# Patient Record
Sex: Female | Born: 1950 | Race: Black or African American | Hispanic: No | Smoking: Never smoker
Health system: Southern US, Community
[De-identification: ages and names within clinical notes are randomized; demographics above are authoritative.]

## PROBLEM LIST (undated history)

## (undated) DIAGNOSIS — E1110 Type 2 diabetes mellitus with ketoacidosis without coma: Secondary | ICD-10-CM

## (undated) DIAGNOSIS — E119 Type 2 diabetes mellitus without complications: Secondary | ICD-10-CM

## (undated) DIAGNOSIS — I739 Peripheral vascular disease, unspecified: Secondary | ICD-10-CM

## (undated) DIAGNOSIS — G629 Polyneuropathy, unspecified: Secondary | ICD-10-CM

## (undated) DIAGNOSIS — E1101 Type 2 diabetes mellitus with hyperosmolarity with coma: Secondary | ICD-10-CM

## (undated) DIAGNOSIS — I7101 Dissection of thoracic aorta: Secondary | ICD-10-CM

## (undated) DIAGNOSIS — E049 Nontoxic goiter, unspecified: Secondary | ICD-10-CM

## (undated) DIAGNOSIS — D62 Acute posthemorrhagic anemia: Secondary | ICD-10-CM

## (undated) DIAGNOSIS — J90 Pleural effusion, not elsewhere classified: Secondary | ICD-10-CM

## (undated) DIAGNOSIS — I10 Essential (primary) hypertension: Secondary | ICD-10-CM

## (undated) DIAGNOSIS — R410 Disorientation, unspecified: Secondary | ICD-10-CM

## (undated) DIAGNOSIS — R531 Weakness: Secondary | ICD-10-CM

## (undated) DIAGNOSIS — E785 Hyperlipidemia, unspecified: Secondary | ICD-10-CM

## (undated) DIAGNOSIS — Z95828 Presence of other vascular implants and grafts: Secondary | ICD-10-CM

## (undated) DIAGNOSIS — G8918 Other acute postprocedural pain: Secondary | ICD-10-CM

## (undated) DIAGNOSIS — R339 Retention of urine, unspecified: Secondary | ICD-10-CM

## (undated) DIAGNOSIS — G4700 Insomnia, unspecified: Secondary | ICD-10-CM

## (undated) DIAGNOSIS — I712 Thoracic aortic aneurysm, without rupture: Secondary | ICD-10-CM

## (undated) DIAGNOSIS — I517 Cardiomegaly: Secondary | ICD-10-CM

## (undated) DIAGNOSIS — N179 Acute kidney failure, unspecified: Secondary | ICD-10-CM

## (undated) DIAGNOSIS — J45909 Unspecified asthma, uncomplicated: Secondary | ICD-10-CM

## (undated) DIAGNOSIS — Z789 Other specified health status: Secondary | ICD-10-CM

## (undated) DIAGNOSIS — N3281 Overactive bladder: Secondary | ICD-10-CM

## (undated) DIAGNOSIS — E118 Type 2 diabetes mellitus with unspecified complications: Secondary | ICD-10-CM

## (undated) DIAGNOSIS — Z1211 Encounter for screening for malignant neoplasm of colon: Secondary | ICD-10-CM

## (undated) DIAGNOSIS — M545 Low back pain, unspecified: Secondary | ICD-10-CM

## (undated) DIAGNOSIS — N289 Disorder of kidney and ureter, unspecified: Secondary | ICD-10-CM

## (undated) DIAGNOSIS — M859 Disorder of bone density and structure, unspecified: Secondary | ICD-10-CM

## (undated) HISTORY — DX: Type 2 diabetes mellitus with hyperosmolarity with coma: E11.01

## (undated) HISTORY — PX: CARDIAC SURGERY: SHX584

## (undated) HISTORY — DX: Acute kidney failure, unspecified: N17.9

## (undated) HISTORY — PX: ABDOMINAL AORTIC ANEURYSM REPAIR: SUR1152

## (undated) HISTORY — DX: Disorientation, unspecified: R41.0

## (undated) HISTORY — PX: OTHER SURGICAL HISTORY: SHX169

## (undated) HISTORY — PX: SHOULDER ARTHROSCOPY: SHX128

## (undated) MED ORDER — METFORMIN 500 MG TAB
500 mg | ORAL_TABLET | Freq: Two times a day (BID) | ORAL | Status: DC
Start: ? — End: 2013-01-25

## (undated) MED ORDER — BUDESONIDE-FORMOTEROL HFA 80 MCG-4.5 MCG/ACTUATION AEROSOL INHALER
Freq: Two times a day (BID) | RESPIRATORY_TRACT | Status: DC
Start: ? — End: 2012-08-10

## (undated) MED ORDER — INDAPAMIDE 1.25 MG TAB
1.25 mg | ORAL_TABLET | Freq: Every day | ORAL | Status: DC
Start: ? — End: 2013-01-25

## (undated) MED ORDER — BUDESONIDE-FORMOTEROL HFA 80 MCG-4.5 MCG/ACTUATION AEROSOL INHALER
Freq: Two times a day (BID) | RESPIRATORY_TRACT | Status: DC
Start: ? — End: 2012-12-18

## (undated) MED ORDER — LISINOPRIL 40 MG TAB
40 mg | ORAL_TABLET | Freq: Every day | ORAL | Status: DC
Start: ? — End: 2013-11-26

## (undated) MED ORDER — AMLODIPINE 10 MG TAB
10 mg | ORAL_TABLET | Freq: Every day | ORAL | Status: DC
Start: ? — End: 2013-01-25

## (undated) MED ORDER — EPINEPHRINE 0.3 MG/0.3 ML IM PEN INJECTOR
0.3 mg/ mL | Freq: Once | INTRAMUSCULAR | Status: DC | PRN
Start: ? — End: 2013-01-25

## (undated) MED ORDER — LISINOPRIL 40 MG TAB
40 mg | ORAL_TABLET | Freq: Every day | ORAL | Status: DC
Start: ? — End: 2013-01-25

## (undated) MED ORDER — AMLODIPINE 10 MG TAB
10 mg | ORAL_TABLET | Freq: Every day | ORAL | Status: DC
Start: ? — End: 2013-11-26

## (undated) MED ORDER — INDAPAMIDE 1.25 MG TAB
1.25 mg | ORAL_TABLET | Freq: Every day | ORAL | Status: DC
Start: ? — End: 2013-11-26

## (undated) MED ORDER — BUDESONIDE-FORMOTEROL HFA 80 MCG-4.5 MCG/ACTUATION AEROSOL INHALER
Freq: Two times a day (BID) | RESPIRATORY_TRACT | Status: DC
Start: ? — End: 2014-04-19

## (undated) MED ORDER — METFORMIN 1,000 MG TAB
1000 mg | ORAL_TABLET | Freq: Two times a day (BID) | ORAL | Status: DC
Start: ? — End: 2013-11-23

---
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
## 2011-04-22 ENCOUNTER — Encounter

## 2012-07-30 NOTE — Progress Notes (Signed)
Chief Complaint   Patient presents with   ??? Establish Care   ??? Lip Swelling     History of Present Illness:      62 year old female presents to establish care.  She is transferring care due to a change in her insurance.  She notes an episode of lip swelling 3 days ago, this was after exposure to shelfish.  She has a known shelfish allergy, to which her lips swell.  She took benedryl with improvement in her symptoms.  She also takes lisinopril for hypertension.     She has a history of asthma, has never smoked but has siblings with asthma and her father smoked in the house when she was growing up.  She has diabetes, is followed by Dr. Manus Gunning ([address], [address]  [place], [place] [address]), she used to take several medications but is currently on metformin 500 twice daily.      Past Medical History   Diagnosis Date   ??? Asthma    ??? Diabetes      Surgical history: shoulder surgery    Current Outpatient Prescriptions   Medication Sig Dispense Refill   ??? EPINEPHrine (EPIPEN) 0.3 mg/0.3 mL (1:1,000) injection 0.3 mL by IntraMUSCular route once as needed for 1 dose.  1 Each  1   ??? budesonide-formoterol (SYMBICORT) 80-4.5 mcg/actuation HFAA inhaler Take 2 Puffs by inhalation two (2) times a day.  1 Inhaler  1   ??? metFORMIN (GLUCOPHAGE) 500 mg tablet Take 1 Tab by mouth two (2) times daily (with meals).  60 Tab  0   ??? lisinopril (PRINIVIL, ZESTRIL) 40 mg tablet Take 1 Tab by mouth daily.  90 Tab  1   ??? amLODIPine (NORVASC) 10 mg tablet Take 1 Tab by mouth daily.  90 Tab  0     Allergies   Allergen Reactions   ??? Iodine Swelling   ??? Penicillins Hives   ??? Salicylates Hives   ??? Shellfish Derived Swelling     History   Substance Use Topics   ??? Smoking status: Not on file   ??? Smokeless tobacco: Not on file   ??? Alcohol Use: Not on file     A 10 system ROS was negative including hematologic, cardiac, pulmonary, skin, gastrointestinal, genitourinary, HEENT, musculoskeletal, rheumatologic, and psychiatric except as noted in the  HPI.    PE:    BP 150/86   Pulse 92   Temp(Src) 99.9 ??F (37.7 ??C)   Resp 16    Alert and oriented  pleasant  Non labored respirations, lungs cta bi  Heart is regular, without murmur.  Abdomen soft  No edema in legs   Normal gait    Assessment/Plan:     62 year old female presents to establish care.     Asthma  - budesonide-formoterol (SYMBICORT) 80-4.5 mcg/actuation HFAA inhaler; Take 2 Puffs by inhalation two (2) times a day.    Diabetes: unclear control.  Labs pending.    - metFORMIN (GLUCOPHAGE) 500 mg tablet; Take 1 Tab by mouth two (2) times daily (with meals).    Hypertension: poor control, will have her check home readings and return for re-check.  Is not on a diuretic.  I asked her to bring 4-5 home blood pressure readings to the next visit.   - lisinopril (PRINIVIL, ZESTRIL) 40 mg tablet; Take 1 Tab by mouth daily.  - amLODIPine (NORVASC) 10 mg tablet; Take 1 Tab by mouth daily.    Allergic  reaction  - EPINEPHrine (EPIPEN) 0.3 mg/0.3 mL (1:1,000) injection; 0.3 mL by IntraMUSCular route once as needed for 1 dose.    We have requested labs from her endocrinologist, Dr. Manus Gunning.      She will follow up with me in 4 weeks.

## 2012-07-30 NOTE — Patient Instructions (Signed)
Asthma in Adults: After Your Visit  Your Care Instructions  During an asthma attack, your airways swell and narrow as a reaction to certain things (triggers). This makes it hard to breathe.  You may be able to prevent asthma attacks if you avoid the things that set off your asthma symptoms. Keeping your asthma under control and treating symptoms before they get bad can help you avoid severe attacks.  If you can control your asthma, you may be able to do all of your normal daily activities. You may also avoid asthma attacks and trips to the hospital.  Follow-up care is a key part of your treatment and safety. Be sure to make and go to all appointments, and call your doctor if you are having problems. It's also a good idea to know your test results and keep a list of the medicines you take.  How can you care for yourself at home?  ?? Follow your asthma action plan so you can manage your symptoms at home. An asthma action plan will help you prevent and control airway reactions and will tell you what to do during an asthma attack. If you do not have an asthma action plan, work with your doctor to build one.  ?? Take your asthma medicine exactly as prescribed. Medicine plays an important role in controlling asthma. Talk to your doctor right away if you have any questions about what to take and how to take it.  ?? Use your quick-relief medicine when you have symptoms of an attack. Quick-relief medicine often is an albuterol inhaler. Some people need to use quick-relief medicine before they exercise.  ?? Take your controller medicine every day, not just when you have symptoms. Controller medicine is usually an inhaled corticosteroid. The goal is to prevent problems before they occur. Do not use your controller medicine to try to treat an attack that has already started. It does not work fast enough to help.  ?? If your doctor prescribed corticosteroid pills to use during an attack, take them as directed. They may take hours  to work, but they may shorten the attack and help you breathe better.  ?? Keep your quick-relief medicine with you at all times.  ?? Talk to your doctor before using other medicines. Some medicines, such as aspirin, can cause asthma attacks in some people.  ?? Learn how to use a peak flow meter to check how well you are breathing. This can help you predict when an asthma attack is going to occur. Then you can take medicine to prevent the asthma attack or make it less severe.  ?? See your doctor regularly. These visits will help you learn more about asthma and what you can do to control it. Your doctor will monitor your treatment to make sure the medicine is helping you.  ?? Keep track of your asthma attacks and your treatment. After you have had an attack, write down what triggered it, what helped end it, and any concerns you have about your asthma action plan. Take your diary when you see your doctor. You can then review your asthma action plan and decide if it is working.  ?? Take your peak flow meter with you when you visit your doctor. Together, you can review the way you use it.  ?? Do not smoke or allow others to smoke around you. Avoid smoky places. Smoking makes asthma worse. If you need help quitting, talk to your doctor about stop-smoking programs and medicines. These can   increase your chances of quitting for good.  ?? Learn what triggers an asthma attack for you, and avoid the triggers when you can. Common triggers include colds, smoke, air pollution, dust, pollen, mold, pets, cockroaches, stress, and cold air.  ?? Avoid colds and the flu. Get a pneumococcal vaccine shot. If you have had one before, ask your doctor whether you need a second dose. Get a flu vaccine every fall. If you must be around people with colds or the flu, wash your hands often.  When should you call for help?  Call 911 anytime you think you may need emergency care. For example, call if:  ?? You have severe trouble breathing.  Call your doctor  now or seek immediate medical care if:  ?? Your symptoms do not get better after you have followed your asthma action plan.  ?? You cough up yellow, dark brown, or bloody mucus (sputum).  Watch closely for changes in your health, and be sure to contact your doctor if:  ?? Your coughing and wheezing get worse.  ?? You need to use quick-relief medicine on more than 2 days a week (unless it is just for exercise).  ?? You need help figuring out what is triggering your asthma attacks.   Where can you learn more?   Go to http://www.healthwise.net/[facility]  Enter P597 in the search box to learn more about "Asthma in Adults: After Your Visit."   ?? 2006-2014 Healthwise, Incorporated. Care instructions adapted under license by [employer] (which disclaims liability or warranty for this information). This care instruction is for use with your licensed healthcare professional. If you have questions about a medical condition or this instruction, always ask your healthcare professional. Healthwise, Incorporated disclaims any warranty or liability for your use of this information.  Content Version: 10.0.270728; Last Revised: February 10, 2012

## 2012-07-31 DIAGNOSIS — E1169 Type 2 diabetes mellitus with other specified complication: Secondary | ICD-10-CM | POA: Insufficient documentation

## 2012-07-31 DIAGNOSIS — E049 Nontoxic goiter, unspecified: Secondary | ICD-10-CM

## 2012-07-31 DIAGNOSIS — E785 Hyperlipidemia, unspecified: Secondary | ICD-10-CM

## 2012-07-31 HISTORY — DX: Hyperlipidemia, unspecified: E78.5

## 2012-07-31 HISTORY — DX: Nontoxic goiter, unspecified: E04.9

## 2012-07-31 NOTE — Progress Notes (Signed)
Received labs from [facility]. Dated 04/13/12    Cr 0.68  Electrolytes normal  Hepatic normal  TSH 1.88  a1c 6.8%  hgb 14.6

## 2012-08-10 ENCOUNTER — Encounter

## 2012-08-10 NOTE — Telephone Encounter (Signed)
Disregard this

## 2012-08-10 NOTE — Telephone Encounter (Signed)
Pt says that symbicort inhaler is to costly could you send something else to pharm.

## 2012-08-27 NOTE — Telephone Encounter (Signed)
Pt would like to pick up letter today please

## 2012-08-27 NOTE — Telephone Encounter (Signed)
Pt needs letter to get her water turned back on.she states she had had pneumonia twice and is diabetic  And heat.   Pt#[phone]

## 2012-08-31 NOTE — Telephone Encounter (Signed)
Nurse completed

## 2012-10-13 NOTE — Telephone Encounter (Signed)
Pt contacted.

## 2012-12-18 ENCOUNTER — Encounter

## 2013-01-25 NOTE — Patient Instructions (Addendum)
Diabetes:    - Increase metformin to 1000 mg twice daily    - Watch carbohydrate intake and aim to keep this < 45 grams per meal  - Avoid calories from beverages (including fruit juice)    Aim for 30 + minutes of exercise daily    Check sugars periodically 2 hours after meals like you are doing  Goals are 90-150 (definitely < 180)    Blood pressure:  Continue amlodipine and lisinopril  Will start indapamide 1.25 mg daily for additional blood pressure lowering    Cholesterol  - statin medication is indicated for you  - will obtain baseline labs today.
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
## 2013-01-27 LAB — METABOLIC PANEL, COMPREHENSIVE
A-G Ratio: 1.8 (ref 1.1–2.5)
ALT (SGPT): 17 IU/L (ref 0–32)
AST (SGOT): 19 IU/L (ref 0–40)
Albumin: 4.6 g/dL (ref 3.6–4.8)
Alk. phosphatase: 64 IU/L (ref 39–117)
BUN/Creatinine ratio: 12 (ref 11–26)
BUN: 9 mg/dL (ref 8–27)
Bilirubin, total: 0.4 mg/dL (ref 0.0–1.2)
CO2: 27 mmol/L (ref 18–29)
Calcium: 9.9 mg/dL (ref 8.6–10.2)
Chloride: 97 mmol/L (ref 97–108)
Creatinine: 0.73 mg/dL (ref 0.57–1.00)
GFR est AA: 101 mL/min/{1.73_m2} (ref >59)
GFR est non-AA: 88 mL/min/{1.73_m2} (ref >59)
GLOBULIN, TOTAL: 2.5 g/dL (ref 1.5–4.5)
Glucose: 100 mg/dL — ABNORMAL HIGH (ref 65–99)
Potassium: 3.8 mmol/L (ref 3.5–5.2)
Protein, total: 7.1 g/dL (ref 6.0–8.5)
Sodium: 140 mmol/L (ref 134–144)

## 2013-01-27 LAB — MICROALBUMIN:CREATININE RATIO, RANDOM URINE
Creatinine, urine random: 100.9 mg/dL (ref 15.0–278.0)
Microalb/Creat ratio (ug/mg creat.): 18.9 mg/g creat (ref 0.0–30.0)
Microalbumin, urine: 19.1 ug/mL — ABNORMAL HIGH (ref 0.0–17.0)

## 2013-01-27 LAB — LIPID PANEL
Cholesterol, total: 166 mg/dL (ref 100–199)
HDL Cholesterol: 51 mg/dL (ref >39)
LDL, calculated: 93 mg/dL (ref 0–99)
Triglyceride: 110 mg/dL (ref 0–149)
VLDL, calculated: 22 mg/dL (ref 5–40)

## 2013-01-27 LAB — TSH 3RD GENERATION: TSH: 1.54 u[IU]/mL (ref 0.450–4.500)

## 2013-01-27 NOTE — Patient Instructions (Addendum)
Colon Cancer Screening: After Your Visit  Your Care Instructions  Colorectal cancer occurs in the colon or rectum. That's the lower part of your digestive system. It is the second-leading cause of cancer deaths in the [place]. It often starts with small growths called polyps in the colon or rectum. Polyps are usually found with screening tests. Depending on the type of test, any polyps found may be removed during the tests.  Colorectal cancer usually does not cause symptoms at first. But regular tests can help find it early, before it spreads and becomes harder to treat. Experts advise routine tests for colon cancer for people starting at age 50. And they advise people with a higher risk of colon cancer to get tested sooner. Talk with your doctor about when you should start testing. Discuss which tests you need.  Follow-up care is a key part of your treatment and safety. Be sure to make and go to all appointments, and call your doctor if you are having problems. It's also a good idea to know your test results and keep a list of the medicines you take.  What are the screening tests for colon cancer?  ?? Stool tests. These include the fecal occult blood test (FOBT), the fecal immunochemical test (FIT), and the stool DNA test (sDNA). These tests check stool samples for signs of cancer. If your test is positive, you may need to have a colonoscopy.  ?? Sigmoidoscopy. This test lets your doctor look at the lining of your rectum and the lowest part of your colon. Your doctor uses a lighted tube called a sigmoidoscope. The test can also be used to remove polyps or get a tissue sample (biopsy). This test can't find cancers or polyps in the upper part of your colon.  ?? Colonoscopy. This test lets your doctor look at the lining of your rectum and your entire colon. The doctor uses a thin, flexible tool called a colonoscope. It can also be used to remove polyps or get a tissue sample (biopsy).  ?? Computed tomographic  colonography (CTC). This test is also called a virtual colonoscopy. A computer and X-rays make a detailed picture of the colon to look for polyps. If this test finds polyps, you may need to have a colonoscopy.  What tests do you need?  The following guidelines are for people age 50 and over who are not at high risk for colorectal cancer. You should have at least one of these tests as directed by your doctor.  ?? Fecal occult blood test (FOBT) each year  ?? Sigmoidoscopy every 5 years  ?? Colonoscopy every 10 years  If you are age 75 and have had regular screenings or are age 80 or older you may not need screening.  Talk with your doctor about when you need to be tested. And discuss which tests are right for you.  Your doctor may recommend earlier or more frequent testing if you:  ?? Have had colorectal cancer before.  ?? Have had colon polyps.  ?? Have symptoms of colorectal cancer. These include blood in your stool and changes in your bowel habits.  ?? Have a parent, brother or sister, or child with colon polyps or colorectal cancer.  ?? Have a bowel disease. This includes ulcerative colitis and Crohn's disease.  ?? Have a rare polyp syndrome that runs in families, such as familial adenomatous polyposis (FAP).  ?? Have had radiation treatments to the belly or pelvis.  When should you call for   help?  Call your doctor now or seek immediate medical care if:  ?? Your stools are black and tarlike or have streaks of blood.  ?? You have pain or tenderness in your lower belly.  Watch closely for changes in your health, and be sure to contact your doctor if:  ?? You have diarrhea, constipation, or another change in bowel habits that does not get better.  ?? Your stools are narrower than usual.  ?? You lose weight and you don't know why.  ?? You have any questions about your screening tests or schedule.   Where can you learn more?   Go to [email or web address]  Enter M541 in the search box to learn more about "Colon Cancer  Screening: After Your Visit."   ?? 2006-2014 Healthwise, Incorporated. Care instructions adapted under license by [employer] (which disclaims liability or warranty for this information). This care instruction is for use with your licensed healthcare professional. If you have questions about a medical condition or this instruction, always ask your healthcare professional. Healthwise, Incorporated disclaims any warranty or liability for your use of this information.  Content Version: 10.1.311062; Current as of: January 08, 2012              Statins: After Your Visit  Your Care Instructions  Statins are medicines that lower your cholesterol and your risk for a heart attack and stroke.  Cholesterol is a type of fat in your blood. If you have too much cholesterol, it can build up in blood vessels. This raises your risk of heart disease, heart attack, and stroke.  Statins lower cholesterol by blocking how much cholesterol your body makes. This prevents cholesterol from building up in your blood vessels. This is called hardening of the arteries. It is the starting point for some heart and blood flow problems, such as heart disease. Statins may also reduce inflammation around the buildup (called plaque). This can lower the risk that the plaque will break apart and lead to a heart attack or stroke.  A heart-healthy lifestyle is important for lowering your risk whether you take statins or not. This includes eating healthy foods, being active, staying at a healthy weight, and not smoking.  You must take statins regularly for them to work well. If you stop, your cholesterol and your risk will go back up.  Examples of statins include:  ?? Atorvastatin (Lipitor).  ?? Lovastatin (Mevacor).  ?? Pravastatin (Pravachol).  ?? Simvastatin (Zocor).  Statins interact with many medicines. So tell your doctor all of the other medicines that you take. These include prescription medicines, over-the-counter medicines, dietary supplements, and  herbal products.  Follow-up care is a key part of your treatment and safety. Be sure to make and go to all appointments, and call your doctor if you are having problems. It's also a good idea to know your test results and keep a list of the medicines you take.  How can you care for yourself at home?  ?? Take statins exactly as your doctor tells you. High cholesterol has no symptoms. So it is easy to forget to take the pills. Try to make a system that reminds you to take them.  ?? Do not take two or more medicines at the same time unless the doctor told you to. Statins can interact with other medicines.  ?? Always tell your doctor if you think you are having a side effect. If side effects are a problem with one medicine, a different one may be  used.  ?? Keep making the lifestyle changes your doctor suggests. Eat heart-healthy foods, be active, don't smoke, and stay at a healthy weight.  ?? Talk to your doctor about avoiding grapefruit juice if you take statins. Grapefruit juice can raise the level of this medicine in your blood. This could increase side effects.  When should you call for help?  Watch closely for changes in your health, and be sure to contact your doctor if:  ?? You have side effects of statins. These include:  ?? Fatigue.  ?? Upset stomach.  ?? Gas.  ?? Constipation.  ?? Pain or cramps in the belly.  ?? Muscle aches.  ?? You have any new symptoms or side effects.   Where can you learn more?   Go to [email or web address]  Enter R358 in the search box to learn more about "Statins: After Your Visit."   ?? 2006-2014 Healthwise, Incorporated. Care instructions adapted under license by [employer] (which disclaims liability or warranty for this information). This care instruction is for use with your licensed healthcare professional. If you have questions about a medical condition or this instruction, always ask your healthcare professional. Healthwise, Incorporated disclaims any warranty or liability for  your use of this information.  Content Version: 10.1.311062; Current as of: July 15, 2012

## 2013-01-27 NOTE — Progress Notes (Signed)
Assessment/Plan:     Sara Hopkins is a 62 y.o. female with well controlled diabetes who refuses statin therapy, zoster vaccination, colon cancer screening.  She has mammogram and pap testing pending and will have a flu shot completed.      We discussed these at length and she will return if she is interested in these therapies.      More than half of today's 25 minute visit was spent on counseling related to the above conditions, including calculating her CV risk, the indications for cancer screening and immunizations, and discussing the risks/benefits of herbal supplements she is considering.      Sara Hopkins expressed understanding of this plan. An After Visit Summary was printed and given to the patient.       Montez Morita, MD     ------------------------------------------------------------------------------    Chief Complaint   Patient presents with   ??? Hypertension   ??? Diabetes     History of Present Illness:      Sara Hopkins is a 62 y.o. female with well controlled diabetes and hypertension who presents for follow up.  She feels well today, notes compliance with medicaitons.      Past Medical History   Diagnosis Date   ??? Asthma    ??? Diabetes    ??? Hypertension      Current Outpatient Prescriptions   Medication Sig Dispense Refill   ??? metformin (GLUCOPHAGE) 1,000 mg tablet Take 1 tablet by mouth two (2) times daily (with meals).  180 tablet  3   ??? lisinopril (PRINIVIL, ZESTRIL) 40 mg tablet Take 1 tablet by mouth daily.  90 tablet  3   ??? indapamide (LOZOL) 1.25 mg tablet Take 1 tablet by mouth daily. For blood pressure  90 tablet  3   ??? amlodipine (NORVASC) 10 mg tablet Take 1 tablet by mouth daily.  90 tablet  3   ??? budesonide-formoterol (SYMBICORT) 80-4.5 mcg/actuation HFAA inhaler Take 2 Puffs by inhalation two (2) times a day.  2 Inhaler  0     Allergies   Allergen Reactions   ??? Latex Hives and Swelling   ??? Iodine Swelling   ??? Penicillins Hives   ??? Salicylates Hives   ??? Shellfish Derived  Swelling     History   Substance Use Topics   ??? Smoking status: Never Smoker    ??? Smokeless tobacco: Not on file   ??? Alcohol Use: No     FH: reviewed, noncontributory.     A 10 system ROS was negative including hematologic, cardiac, pulmonary, skin, gastrointestinal, genitourinary, HEENT, musculoskeletal, rheumatologic, and psychiatric except as noted in the HPI.    Physical Exam    BP 142/80   Pulse 88   Temp(Src) 97.7 ??F (36.5 ??C) (Oral)   Resp 18   Ht 5\' 6"  (1.676 m)   Wt 195 lb 6.4 oz (88.633 kg)   BMI 31.55 kg/m2   SpO2 97%    Alert and oriented x 3  Non icteric  Neuro grossly intact  Hearing grossly intact  Non labored respirations  Pulse is regular, No edema in legs   Abdomen soft  Normal gait    Lab Results   Component Value Date/Time    Sodium 140 01/26/2013 11:24 AM    Potassium 3.8 01/26/2013 11:24 AM    Chloride 97 01/26/2013 11:24 AM    CO2 27 01/26/2013 11:24 AM    Glucose 100 01/26/2013 11:24 AM  BUN 9 01/26/2013 11:24 AM    Creatinine 0.73 01/26/2013 11:24 AM    BUN/Creatinine ratio 12 01/26/2013 11:24 AM    GFR est non-AA 88 01/26/2013 11:24 AM    Calcium 9.9 01/26/2013 11:24 AM    GFR est AA 101 01/26/2013 11:24 AM     Lab Results   Component Value Date/Time    Microalb/Creat ratio (ug/mg creat.) 18.9 01/26/2013 11:24 AM    Microalbumin, urine 19.1 01/26/2013 11:24 AM     Lab Results   Component Value Date/Time    Cholesterol, total 166 01/26/2013 11:24 AM    HDL Cholesterol 51 01/26/2013 11:24 AM    LDL, calculated 93 01/26/2013 11:24 AM    VLDL, calculated 22 01/26/2013 11:24 AM    Triglyceride 110 01/26/2013 11:24 AM       Last a1c 6.7%

## 2013-01-27 NOTE — Progress Notes (Signed)
Chief Complaint   Patient presents with   ??? Hypertension   ??? Diabetes   Pt states she does not want to take b/s today because she already ate and is not going to be accurate and she does not believe it.

## 2013-02-03 ENCOUNTER — Encounter

## 2013-02-03 NOTE — Telephone Encounter (Addendum)
Message copied by Neldon Labella on Wed Feb 03, 2013  9:08 AM  ------       Message from: Lewayne Bunting D       Created: Tue Feb 02, 2013  9:17 PM         Please call and mail lab letter.        Thank you.       Please see if she can do pre-labs for Dec visit - A1c and BMP. Thank you  ------  LM on patient's home/mobile phone stating we are mailing a lab letter and pre lab order for next visit to her home address.

## 2013-05-28 NOTE — Progress Notes (Addendum)
History of Present Illness: Sara Hopkins is a 63 y.o. female presents for follow-up of diabetes.  Dx wit diabetes in 2009.  Initial visit in 01/2013.    Most recent A1c was 6.8 in in 06/2012    Current diabetes regimen:  Metformin 1000 mg BID.tolerating well.     Glucoses:  Checks 2 hours after meals on occasion - 121 after breakfast.      Diet:  Carnation instant drink for breakfast.     Exercise - not walking as much.    HTN - taking amlodipine and lisinopril. Indapamide 1.25 mg added  BP at home - checks M, W, F - 120s-high 130s.     Social  Retired 2 years ago    Family hx - father was as smoker. Mother is [id number] and says she has no medical problems. No immediate family members with diabetes    Past Medical History   Diagnosis Date   ??? Asthma    ??? Diabetes    ??? Hypertension      Current Outpatient Prescriptions   Medication Sig   ??? metformin (GLUCOPHAGE) 1,000 mg tablet Take 1 tablet by mouth two (2) times daily (with meals).   ??? lisinopril (PRINIVIL, ZESTRIL) 40 mg tablet Take 1 tablet by mouth daily.   ??? indapamide (LOZOL) 1.25 mg tablet Take 1 tablet by mouth daily. For blood pressure   ??? amlodipine (NORVASC) 10 mg tablet Take 1 tablet by mouth daily.   ??? budesonide-formoterol (SYMBICORT) 80-4.5 mcg/actuation HFAA inhaler Take 2 Puffs by inhalation two (2) times a day.     No current facility-administered medications for this visit.     Allergies   Allergen Reactions   ??? Latex Hives and Swelling   ??? Iodine Swelling   ??? Penicillins Hives   ??? Salicylates Hives   ??? Shellfish Derived Swelling       Review of Systems:  - Eyes: no blurry vision or double vision  - Cardiovascular: no chest pain  - Respiratory: no shortness of breath  - Musculoskeletal: no myalgias  - Neurological: no numbness/tingling in extremities    Physical Examination:  Blood pressure 125/90, pulse 85, height 5\' 6"  (1.676 m), weight 190 lb (86.183 kg).  - General: pleasant, no distress, normal gait   HEENT: hearing intact, EOMI, clear sclera  without icterus  - Cardiovascular: regular, normal rate  - Respiratory: normal effort  - Integumentary: no edema  - Psychiatric: normal mood and affect    Data Reviewed:   Component      Latest Ref Rng 05/27/2013 01/26/2013 01/26/2013 01/26/2013           9:50 AM 11:24 AM 11:24 AM 11:24 AM   Cholesterol, total      100 - 199 mg/dL  [id number]     Triglyceride      0 - 149 mg/dL  [id number]     HDL Cholesterol      >39 mg/dL  51     VLDL, calculated      5 - 40 mg/dL  22     LDL, calculated      0 - 99 mg/dL  93     Creatinine,urine random      15.0 - 278.0 mg/dL   [id number]    Microalbumin, urine      0.0 - 17.0 ug/mL   19.1 (H)    Microalbumin/Creat. Ratio      0.0 - 30.0 mg/g creat   18.9  TSH      0.450 - 4.500 uIU/mL    1.540   Hemoglobin A1c, (calculated)      4.8 - 5.6 % 7.3 (H)          Assessment/Plan:   1. Diabetes   - appears controlled  - recommend she monitor before lunch, supper and particularly bedtime to determine trend throughout the day.    \\- encouraged dietary and exercise efforts.  - reviewed again indication for statin therapy, but she refused.   2. Hypertension   - continue amlodipine, lisinopril and indapamide     Patient Instructions   Diabetes:  Continue  metformin to 1000 mg twice daily  Watch carbohydrate intake and aim to keep this < 45 grams per meal  Aim for 30 + minutes of exercise daily    Check sugars periodically 2 hours after meals like you are doing  Goals are 90-150 (definitely < 180)    Blood pressure:  Continue amlodipine, lisinopril and indapamide 1.25 mg daily     Cholesterol  - statin medication is indicated for you              Follow-up Disposition:  Return in about 6 months (around 11/25/2013).

## 2013-05-28 NOTE — Patient Instructions (Signed)
Diabetes:  Continue  metformin to 1000 mg twice daily  Watch carbohydrate intake and aim to keep this < 45 grams per meal  Aim for 30 + minutes of exercise daily    Check sugars periodically 2 hours after meals like you are doing  Goals are 90-150 (definitely < 180)    Blood pressure:  Continue amlodipine, lisinopril and indapamide 1.25 mg daily     Cholesterol  - statin medication is indicated for you

## 2013-06-02 LAB — METABOLIC PANEL, BASIC
BUN/Creatinine ratio: 16 (ref 11–26)
BUN: 11 mg/dL (ref 8–27)
CO2: 25 mmol/L (ref 18–29)
Calcium: 10 mg/dL (ref 8.7–10.3)
Chloride: 96 mmol/L — ABNORMAL LOW (ref 97–108)
Creatinine: 0.67 mg/dL (ref 0.57–1.00)
GFR est AA: 108 mL/min/{1.73_m2} (ref >59)
GFR est non-AA: 94 mL/min/{1.73_m2} (ref >59)
Glucose: 98 mg/dL (ref 65–99)
Potassium: 4.1 mmol/L (ref 3.5–5.2)
Sodium: 141 mmol/L (ref 134–144)

## 2013-06-02 LAB — DIABETES PATIENT EDUCATION: PDF Image: 0

## 2013-06-02 LAB — HEMOGLOBIN A1C WITH EAG: Hemoglobin A1c: 7.3 % — ABNORMAL HIGH (ref 4.8–5.6)

## 2013-06-17 NOTE — Telephone Encounter (Signed)
Per patient's request, I mailed a copy of her lab results from 05/27/13 and 01/26/13.

## 2013-08-27 NOTE — Progress Notes (Signed)
Sara Hopkins missed our scheduled appointment on 08/27/2013. Sara MoritaStephen J Hopkins Fei, MD

## 2013-11-01 NOTE — Patient Instructions (Signed)
Diabetes:  Continue  metformin to 1000 mg twice daily  Watch carbohydrate intake and aim to keep this < 45 grams per meal  Continue efforts with exercise most days    Check sugars periodically 2 hours after meals like you are doing  Goals are 90-150 (definitely < 180)    Blood pressure:  Continue amlodipine, lisinopril and indapamide 1.25 mg daily     Cholesterol  - statin medication is indicated for you

## 2013-11-01 NOTE — Progress Notes (Addendum)
History of Present Illness: Sara Hopkins is a 64 y.o. female presents for follow-up of diabetes.  Dx wit diabetes in 2009.    Most recent A1c was 7.3 in 05/2013.     Husband had severe CVA in May 2015.  His family is caring for him and she has not been in touch with him.     Current diabetes regimen:  Metformin 1000 mg BID.tolerating well.     Glucoses:  116 this AM. Says low 100s-126 generally.     Diet:  More fruits. Less juices and sugared beverages.     Exercise - has been more active. Walking 3 x a week.      HTN - taking amlodipine and lisinopril. Indapamide 1.25 mg.     Past Medical History   Diagnosis Date   ??? Asthma    ??? Diabetes ([place])    ??? Hypertension      Current Outpatient Prescriptions   Medication Sig   ??? metformin (GLUCOPHAGE) 1,000 mg tablet Take 1 tablet by mouth two (2) times daily (with meals).   ??? lisinopril (PRINIVIL, ZESTRIL) 40 mg tablet Take 1 tablet by mouth daily.   ??? indapamide (LOZOL) 1.25 mg tablet Take 1 tablet by mouth daily. For blood pressure   ??? amlodipine (NORVASC) 10 mg tablet Take 1 tablet by mouth daily.   ??? budesonide-formoterol (SYMBICORT) 80-4.5 mcg/actuation HFAA inhaler Take 2 Puffs by inhalation two (2) times a day.     No current facility-administered medications for this visit.     Allergies   Allergen Reactions   ??? Latex Hives and Swelling   ??? Iodine Swelling   ??? Penicillins Hives   ??? Salicylates Hives   ??? Shellfish Derived Swelling       Review of Systems:  - Eyes: no blurry vision or double vision. Overdue for eye exam  - Cardiovascular: no chest pain  - Respiratory: no shortness of breath  - Musculoskeletal: no myalgias  - Neurological: no numbness/tingling in extremities    Physical Examination:  - Blood pressure 143/88, pulse 92, height $RemoveBe'5\' 6"'TVSskZZgs$  (1.676 m), weight 190 lb (86.183 kg).  - General: pleasant, no distress, normal gait   HEENT: hearing intact, EOMI, clear sclera without icterus  - Cardiovascular: regular, normal rate  - Respiratory: normal effort   - Integumentary: no edema  - Psychiatric: normal mood and affect    Data Reviewed:   Component      Latest Ref Rng 05/27/2013 05/27/2013 01/26/2013           9:50 AM  9:50 AM 11:24 AM   Glucose      65 - 99 mg/dL  98    BUN      8 - 27 mg/dL  11    Creatinine      0.57 - 1.00 mg/dL  0.67    GFR est non-AA      >59 mL/min/1.73  94    GFR est AA      >59 mL/min/1.73  108    BUN/Creatinine ratio      11 - 26  16    Sodium      134 - 144 mmol/L  141    Potassium      3.5 - 5.2 mmol/L  4.1    Chloride      97 - 108 mmol/L  96 (L)    CO2      18 - 29 mmol/L  25    Calcium  8.7 - 10.3 mg/dL  10.0    Protein, total      6.0 - 8.5 g/dL      Albumin      3.6 - 4.8 g/dL      GLOBULIN, TOTAL      1.5 - 4.5 g/dL      A-G Ratio      1.1 - 2.5      Bilirubin, total      0.0 - 1.2 mg/dL      Alk. phosphatase      39 - 117 IU/L      AST      0 - 40 IU/L      ALT      0 - 32 IU/L      Cholesterol, total      100 - 199 mg/dL   166   Triglyceride      0 - 149 mg/dL   110   HDL Cholesterol      >39 mg/dL   51   VLDL, calculated      5 - 40 mg/dL   22   LDL, calculated      0 - 99 mg/dL   93   Creatinine,urine random      15.0 - 278.0 mg/dL      Microalbumin, urine      0.0 - 17.0 ug/mL      Microalbumin/Creat. Ratio      0.0 - 30.0 mg/g creat      Hemoglobin A1c, (calculated)      4.8 - 5.6 % 7.3 (H)         Assessment/Plan:   1. Type 2 diabetes mellitus without complication (HCC)   Based on home monitoring, this should be improved  - repeat A1c  - continue dietary and exercise efforts and metformin   2. Essential hypertension   - has stress in her life. Will continue current medications and reassess at follow-up     Patient Instructions   Diabetes:  Continue  metformin to 1000 mg twice daily  Watch carbohydrate intake and aim to keep this < 45 grams per meal  Continue efforts with exercise most days    Check sugars periodically 2 hours after meals like you are doing  Goals are 90-150 (definitely < 180)    Blood pressure:   Continue amlodipine, lisinopril and indapamide 1.25 mg daily     Cholesterol  - statin medication is indicated for you              Follow-up Disposition:  Return in about 6 months (around 05/03/2014).    11/13/2013  Component      Latest Ref Rng 11/01/2013 11/01/2013 11/01/2013 11/01/2013          12:00 PM 12:00 PM 12:00 PM 12:00 PM   Glucose      65 - 99 mg/dL 93      BUN      8 - 27 mg/dL 13      Creatinine      0.57 - 1.00 mg/dL 0.77      GFR est non-AA      >59 mL/min/1.73 82      GFR est AA      >59 mL/min/1.73 94      BUN/Creatinine ratio      11 - 26 17      Sodium      134 - 144 mmol/L 138      Potassium  3.5 - 5.2 mmol/L 3.8      Chloride      97 - 108 mmol/L 93 (L)      CO2      18 - 29 mmol/L 27      Calcium      8.7 - 10.3 mg/dL 9.9      Protein, total      6.0 - 8.5 g/dL 7.0      Albumin      3.6 - 4.8 g/dL 4.6      GLOBULIN, TOTAL      1.5 - 4.5 g/dL 2.4      A-G Ratio      1.1 - 2.5 1.9      Bilirubin, total      0.0 - 1.2 mg/dL <0.2      Alk. phosphatase      39 - 117 IU/L 51      AST      0 - 40 IU/L 12      ALT      0 - 32 IU/L 9      Cholesterol, total      100 - 199 mg/dL  147     Triglyceride      0 - 149 mg/dL  110     HDL Cholesterol      >39 mg/dL  41     VLDL, calculated      5 - 40 mg/dL  22     LDL, calculated      0 - 99 mg/dL  84     Creatinine,urine random      15.0 - 278.0 mg/dL   116.7    Microalbumin, urine      0.0 - 17.0 ug/mL   23.1 (H)    Microalbumin/Creat. Ratio      0.0 - 30.0 mg/g creat   19.8    Hemoglobin A1c, (calculated)      4.8 - 5.6 %    7.3 (H)   - will follow for now. A1c is higher than expected. Will recommend periodic checking ac meals and hs.

## 2013-11-02 LAB — METABOLIC PANEL, COMPREHENSIVE
A-G Ratio: 1.9 (ref 1.1–2.5)
ALT (SGPT): 9 IU/L (ref 0–32)
AST (SGOT): 12 IU/L (ref 0–40)
Albumin: 4.6 g/dL (ref 3.6–4.8)
Alk. phosphatase: 51 IU/L (ref 39–117)
BUN/Creatinine ratio: 17 (ref 11–26)
BUN: 13 mg/dL (ref 8–27)
Bilirubin, total: <0.2 mg/dL (ref 0.0–1.2)
CO2: 27 mmol/L (ref 18–29)
Calcium: 9.9 mg/dL (ref 8.7–10.3)
Chloride: 93 mmol/L — ABNORMAL LOW (ref 97–108)
Creatinine: 0.77 mg/dL (ref 0.57–1.00)
GFR est AA: 94 mL/min/{1.73_m2} (ref >59)
GFR est non-AA: 82 mL/min/{1.73_m2} (ref >59)
GLOBULIN, TOTAL: 2.4 g/dL (ref 1.5–4.5)
Glucose: 93 mg/dL (ref 65–99)
Potassium: 3.8 mmol/L (ref 3.5–5.2)
Protein, total: 7 g/dL (ref 6.0–8.5)
Sodium: 138 mmol/L (ref 134–144)

## 2013-11-02 LAB — MICROALBUMIN:CREATININE RATIO, RANDOM URINE
Creatinine, urine random: 116.7 mg/dL (ref 15.0–278.0)
Microalb/Creat ratio (ug/mg creat.): 19.8 mg/g creat (ref 0.0–30.0)
Microalbumin, urine: 23.1 ug/mL — ABNORMAL HIGH (ref 0.0–17.0)

## 2013-11-02 LAB — LIPID PANEL
Cholesterol, total: 147 mg/dL (ref 100–199)
HDL Cholesterol: 41 mg/dL (ref >39)
LDL, calculated: 84 mg/dL (ref 0–99)
Triglyceride: 110 mg/dL (ref 0–149)
VLDL, calculated: 22 mg/dL (ref 5–40)

## 2013-11-02 LAB — HEMOGLOBIN A1C WITH EAG: Hemoglobin A1c: 7.3 % — ABNORMAL HIGH (ref 4.8–5.6)

## 2013-11-13 NOTE — Progress Notes (Signed)
Quick Note:        - will follow for now. A1c is higher than expected. Will recommend periodic checking ac meals and hs.    Sara Hopkins,    Please call and mail lab letter.    Thank you.    Please also mail pre-labs for: A1c and BMP.        ______

## 2013-11-23 MED ORDER — METFORMIN 1,000 MG TAB
1000 mg | ORAL_TABLET | ORAL | Status: DC
Start: 2013-11-23 — End: 2014-05-13

## 2013-11-24 ENCOUNTER — Encounter

## 2013-11-25 ENCOUNTER — Encounter

## 2013-11-26 MED ORDER — AMLODIPINE 10 MG TAB
10 mg | ORAL_TABLET | Freq: Every day | ORAL | Status: DC
Start: 2013-11-26 — End: 2014-05-13

## 2013-11-26 MED ORDER — INDAPAMIDE 1.25 MG TAB
1.25 mg | ORAL_TABLET | Freq: Every day | ORAL | Status: DC
Start: 2013-11-26 — End: 2014-05-13

## 2013-11-26 MED ORDER — LISINOPRIL 40 MG TAB
40 mg | ORAL_TABLET | Freq: Every day | ORAL | Status: DC
Start: 2013-11-26 — End: 2014-11-24

## 2013-11-26 NOTE — Telephone Encounter (Signed)
[facility] faxed a refill request.

## 2014-03-29 NOTE — Telephone Encounter (Signed)
----- Message from Hanley BenBlair L Hildreth, LPN sent at 16/10/960411/24/2015 10:59 AM EST -----  I reached patient. She stated she has been doing some thinking and has decided to follow your advice and will continue the indapamide.   ----- Message -----     From: Myrna BlazerGregory D Roper Tolson, MD     Sent: 03/25/2014   3:53 PM       To: Hanley BenBlair L Hildreth, LPN    Low chloride was likely due to indapamide.    Chloride was at very low end of normal at 97 before starting indapamide in 9/20914  Indapamide is a diuretic and chloride levels often decrease with use of diuretics and with fasting.   Her BUN/creatinine ratio increased (from 12 to 16 and 17) as chloride decreased, consistent with her being a little more on the dry side (not dehydrated though)    We will follow this. I recommend she drink water before the next blood tests.     Isolated low chloride is not a concern so long as potassium and sodium are normal, which they were in her case.       ----- Message -----     From: Hanley BenBlair L Hildreth, LPN     Sent: 54/09/811911/20/2015   3:13 PM       To: Myrna BlazerGregory D Makaela Cando, MD    She was very insistent on getting more information about the chloride being low. What should I tell her?   ----- Message -----     From: Myrna BlazerGregory D Alvilda Mckenna, MD     Sent: 03/25/2014   3:00 PM       To: Hanley BenBlair L Hildreth, LPN    She can stop the indapamide and we can follow blood pressure.  It certainly sounds like she would like to stop this, so this is fine.     ----- Message -----     From: Hanley BenBlair L Hildreth, LPN     Sent: 14/78/295611/20/2015   2:23 PM       To: Myrna BlazerGregory D Belkys Henault, MD    I reached patient and read your recommendation and she voiced understanding. She stated she still feels the indapamide has caused skin changes and she doesn't care what a study showed, she is different than most patients. She asked what caused the chloride to be low on her labs. She is concerned about side effects of the indapamide. I offered her an appointment next week to discuss this further, but she declined.    ----- Message -----     From: Myrna BlazerGregory D Nickson Middlesworth, MD     Sent: 03/22/2014   2:00 PM       To: Hanley BenBlair L Hildreth, LPN    Recent study showed considerable benefit to achieving blood pressure goal of 120 systolic or less vs goal of < 213140.    Her blood pressure sounds good on current medications  Indapamide dose as a diuretic is 2.5 to 5 mg and she is taking 1.25 mg, so it should not be moving too much fluid.  I would recommend she continue this as I feel she is benefiting from better BP control and I do not think it is causing skin changes.     I'll be happy to discuss her skin changes at a future visit and see if we can determine if medications needs to be adjusted or if further evaluation is needed.    ----- Message -----     From: Hanley BenBlair L Hildreth, LPN  Sent: 03/22/2014   1:46 PM       To: Myrna BlazerGregory D Jamarie Joplin, MD    Patient stopped by the office to review her past lab results. I printed a flow sheet with her results and gave her a pre lab order for her next visit.   She stated since starting indapamide, her skin feels looser and looks wrinkly. She feels indapamide is removing too much fluid. She monitors her blood pressure daily and values are generally 120/74-128/84.  She wants to stop indapamide if it is not necessary.

## 2014-04-19 ENCOUNTER — Ambulatory Visit: Admit: 2014-04-19 | Discharge: 2014-04-19 | Payer: PRIVATE HEALTH INSURANCE | Attending: Internal Medicine

## 2014-04-19 DIAGNOSIS — E119 Type 2 diabetes mellitus without complications: Secondary | ICD-10-CM

## 2014-04-19 NOTE — Progress Notes (Signed)
History of Present Illness: Sara Hopkins is a 63 y.o. female presents for follow-up of diabetes.  Dx wit diabetes in 2009.    Most recent A1c was 7.3 in 10/2013    Current diabetes regimen:  Metformin 1000 mg BID.tolerating well, but would like to decrease this dose.     Glucoses:  Most are 100-110. She checks after meals.     Diet:  More fruits. Less juices and sugared beverages.     Weight is down 10 lbs since last visit and down 15 lbs from a year ago. She attributes this mainly to eating less carbohydrates.    Exercise - continues regular walking    HTN - taking amlodipine and lisinopril. Indapamide 1.25 mg. BP is lower today. She denies orthostatic sx.      Past Medical History   Diagnosis Date   ??? Asthma    ??? Diabetes ([place])    ??? Hypertension      Current Outpatient Prescriptions   Medication Sig   ??? indapamide (LOZOL) 1.25 mg tablet Take 1 Tab by mouth daily. For blood pressure   ??? amLODIPine (NORVASC) 10 mg tablet Take 1 Tab by mouth daily.   ??? lisinopril (PRINIVIL, ZESTRIL) 40 mg tablet Take 1 Tab by mouth daily.   ??? metFORMIN (GLUCOPHAGE) 1,000 mg tablet TAKE ONE TABLET BY MOUTH TWICE A DAY WITH MEALS   ??? budesonide-formoterol (SYMBICORT) 80-4.5 mcg/actuation HFAA inhaler Take 2 Puffs by inhalation two (2) times a day.     No current facility-administered medications for this visit.     Allergies   Allergen Reactions   ??? Latex Hives and Swelling   ??? Iodine Swelling   ??? Penicillins Hives   ??? Salicylates Hives   ??? Shellfish Derived Swelling       Review of Systems:  - Eyes: no blurry vision or double vision  - Cardiovascular: no chest pain  - Respiratory: no shortness of breath  - Musculoskeletal: no myalgias  - Neurological: no numbness/tingling in extremities    Physical Examination:  - Blood pressure 107/66, pulse 92, height $RemoveBe'5\' 6"'ecPhTOgyF$  (1.676 m), weight 181 lb (82.101 kg).  - General: pleasant, no distress, normal gait   HEENT: hearing intact, EOMI, clear sclera without icterus   - Cardiovascular: regular, normal rate  - Respiratory: normal effort  - Integumentary: no edema   Diabetic foot exam:     Left: Filament test normal sensation with micro filament   Pulse DP: 2+ (normal)   Deformities: None - insignificant bunion deformity    - Psychiatric: normal mood and affect    Data Reviewed:   Component      Latest Ref Rng 11/01/2013 11/01/2013 11/01/2013 11/01/2013          12:00 PM 12:00 PM 12:00 PM 12:00 PM   Glucose      65 - 99 mg/dL 93      BUN      8 - 27 mg/dL 13      Creatinine      0.57 - 1.00 mg/dL 0.77      GFR est non-AA      >59 mL/min/1.73 82      GFR est AA      >59 mL/min/1.73 94      BUN/Creatinine ratio      11 - 26 17      Sodium      134 - 144 mmol/L 138      Potassium  3.5 - 5.2 mmol/L 3.8      Chloride      97 - 108 mmol/L 93 (L)      CO2      18 - 29 mmol/L 27      Calcium      8.7 - 10.3 mg/dL 9.9      Protein, total      6.0 - 8.5 g/dL 7.0      Albumin      3.6 - 4.8 g/dL 4.6      GLOBULIN, TOTAL      1.5 - 4.5 g/dL 2.4      A-G Ratio      1.1 - 2.5 1.9      Bilirubin, total      0.0 - 1.2 mg/dL <0.2      Alk. phosphatase      39 - 117 IU/L 51      AST      0 - 40 IU/L 12      ALT      0 - 32 IU/L 9      Cholesterol, total      100 - 199 mg/dL  147     Triglyceride      0 - 149 mg/dL  110     HDL Cholesterol      >39 mg/dL  41     VLDL, calculated      5 - 40 mg/dL  22     LDL, calculated      0 - 99 mg/dL  84     Creatinine, urine      15.0 - 278.0 mg/dL   116.7    Microalbumin, urine      0.0 - 17.0 ug/mL   23.1 (H)    Microalbumin/Creat. Ratio      0.0 - 30.0 mg/g creat   19.8    Hemoglobin A1c, (calculated)      4.8 - 5.6 %    7.3 (H)       Assessment/Plan:   1. Type 2 diabetes mellitus without complication (HCC)   -- repeat A1c. Appears controlled.  She wants to decrease metformin  If A1c is < 6.5 we can decrease to 500 mg BID. She did not feel a decrease to 850 mg twice daily was not a substantial decrease  - encouraged further weight loss and dietary efforts    2. Essential hypertension   - well controlled. Continue current medications. If further weight is lost and BP remains lower, can lower amlodipine dose and/or stop indapamide     Patient Instructions   Diabetes:  Continue  metformin to 1000 mg twice daily  IF A1c is < 6.5 we can decrease metformin to 500 mg twice daily  Continue efforts watch carbohydrate intake and aim to keep this < 45 grams per meal  Continue efforts with exercise most days    Check sugars periodically 2 hours after meals like you are doing  Goals are 90-150 (definitely < 180)    Blood pressure:  Continue amlodipine, lisinopril and indapamide 1.25 mg daily     - blood pressure is better with weight loss. May be able to decrease medications if BP remains very good.       Follow-up Disposition:  Return in about 6 months (around 10/19/2014).

## 2014-04-19 NOTE — Patient Instructions (Addendum)
Diabetes:  Continue  metformin to 1000 mg twice daily  IF A1c is < 6.5 we can decrease metformin to 500 mg twice daily  Continue efforts watch carbohydrate intake and aim to keep this < 45 grams per meal  Continue efforts with exercise most days    Check sugars periodically 2 hours after meals like you are doing  Goals are 90-150 (definitely < 180)    Blood pressure:  Continue amlodipine, lisinopril and indapamide 1.25 mg daily     - blood pressure is better with weight loss. May be able to decrease medications if BP remains very good.

## 2014-04-21 LAB — METABOLIC PANEL, BASIC
BUN/Creatinine ratio: 11 (ref 11–26)
BUN: 29 mg/dL — ABNORMAL HIGH (ref 8–27)
CO2: 26 mmol/L (ref 18–29)
Calcium: 10.9 mg/dL — ABNORMAL HIGH (ref 8.7–10.3)
Chloride: 92 mmol/L — ABNORMAL LOW (ref 97–108)
Creatinine: 2.74 mg/dL — ABNORMAL HIGH (ref 0.57–1.00)
GFR est AA: 20 mL/min/{1.73_m2} — ABNORMAL LOW (ref >59)
GFR est non-AA: 18 mL/min/{1.73_m2} — ABNORMAL LOW (ref >59)
Glucose: 160 mg/dL — ABNORMAL HIGH (ref 65–99)
Potassium: 3.9 mmol/L (ref 3.5–5.2)
Sodium: 142 mmol/L (ref 134–144)

## 2014-04-21 LAB — CKD REPORT

## 2014-04-21 LAB — HEMOGLOBIN A1C WITH EAG: Hemoglobin A1c: 7.5 % — ABNORMAL HIGH (ref 4.8–5.6)

## 2014-04-22 ENCOUNTER — Encounter

## 2014-04-22 NOTE — Telephone Encounter (Signed)
Renal function was considerably worse on labs  Called and left message to stop indapamide and amlodipine for blood pressure as this was low at visit  - recommended she continue lisinopril  - Also asked her to stop any NSAIDs she was taking (Aleve, Advil, Motrin or generic versions)    Will send her mychart message  - will mail her lab orders to have renal function repeated in 1-2 weeks.

## 2014-04-25 NOTE — Addendum Note (Signed)
Addended by: Lewayne BuntingOOK, Kobey Sides D on: 04/25/2014 10:17 AM      Modules accepted: Orders

## 2014-04-25 NOTE — Telephone Encounter (Signed)
-----   Message from Hanley BenBlair L Hildreth, LPN sent at 16/10/960412/21/2015  9:57 AM EST -----  I reached Sara Hopkins. She received your voicemail and has stopped amlodipine and indapamide. She does not take NSAIDs and will continue to avoid these.   She was not aware she was supposed to stop metformin and already took her dose this morning, she will stop metformin tomorrow.   ----- Message -----     From: Myrna BlazerGregory D Evian Derringer, MD     Sent: 04/25/2014   9:28 AM       To: Hanley BenBlair L Hildreth, LPN    Sara Hopkins,    Please call Mrs Sara Hopkins. I sent her a mychart message about her labs on Friday and left a voicemail as well.    Please read her the mychart message and directions.  Please ask if she has been taking any NSAIDs( Aleve, Advil, Motrin, naproxen, ibuprofen, or other prescription pain medications)      Thank you  Tammy SoursGreg

## 2014-05-05 LAB — CBC WITH AUTOMATED DIFF
ABS. BASOPHILS: 0 10*3/uL (ref 0.0–0.2)
ABS. EOSINOPHILS: 0.2 10*3/uL (ref 0.0–0.4)
ABS. IMM. GRANS.: 0 10*3/uL (ref 0.0–0.1)
ABS. MONOCYTES: 0.4 10*3/uL (ref 0.1–0.9)
ABS. NEUTROPHILS: 4.5 10*3/uL (ref 1.4–7.0)
Abs Lymphocytes: 2.3 10*3/uL (ref 0.7–3.1)
BASOPHILS: 0 %
EOSINOPHILS: 3 %
HCT: 38 % (ref 34.0–46.6)
HGB: 12.6 g/dL (ref 11.1–15.9)
IMMATURE GRANULOCYTES: 0 %
Lymphocytes: 31 %
MCH: 30.6 pg (ref 26.6–33.0)
MCHC: 33.2 g/dL (ref 31.5–35.7)
MCV: 92 fL (ref 79–97)
MONOCYTES: 5 %
NEUTROPHILS: 61 %
PLATELET: 238 10*3/uL (ref 150–379)
RBC: 4.12 x10E6/uL (ref 3.77–5.28)
RDW: 14.5 % (ref 12.3–15.4)
WBC: 7.4 10*3/uL (ref 3.4–10.8)

## 2014-05-05 LAB — METABOLIC PANEL, COMPREHENSIVE
A-G Ratio: 1.6 (ref 1.1–2.5)
ALT (SGPT): 13 IU/L (ref 0–32)
AST (SGOT): 16 IU/L (ref 0–40)
Albumin: 4.2 g/dL (ref 3.6–4.8)
Alk. phosphatase: 58 IU/L (ref 39–117)
BUN/Creatinine ratio: 16 (ref 11–26)
BUN: 12 mg/dL (ref 8–27)
Bilirubin, total: 0.4 mg/dL (ref 0.0–1.2)
CO2: 26 mmol/L (ref 18–29)
Calcium: 9.5 mg/dL (ref 8.7–10.3)
Chloride: 101 mmol/L (ref 97–108)
Creatinine: 0.75 mg/dL (ref 0.57–1.00)
GFR est AA: 97 mL/min/{1.73_m2} (ref >59)
GFR est non-AA: 85 mL/min/{1.73_m2} (ref >59)
GLOBULIN, TOTAL: 2.6 g/dL (ref 1.5–4.5)
Glucose: 193 mg/dL — ABNORMAL HIGH (ref 65–99)
Potassium: 3.7 mmol/L (ref 3.5–5.2)
Protein, total: 6.8 g/dL (ref 6.0–8.5)
Sodium: 141 mmol/L (ref 134–144)

## 2014-05-05 LAB — PTH INTACT: PTH, Intact: 11 pg/mL — ABNORMAL LOW (ref 15–65)

## 2014-05-05 LAB — VITAMIN D, 25 HYDROXY: VITAMIN D, 25-HYDROXY: 29.6 ng/mL — ABNORMAL LOW (ref 30.0–100.0)

## 2014-05-13 ENCOUNTER — Ambulatory Visit: Admit: 2014-05-13 | Discharge: 2014-05-13 | Payer: PRIVATE HEALTH INSURANCE | Attending: Internal Medicine

## 2014-05-13 DIAGNOSIS — E119 Type 2 diabetes mellitus without complications: Secondary | ICD-10-CM

## 2014-05-13 MED ORDER — METFORMIN 500 MG TAB
500 mg | ORAL_TABLET | Freq: Two times a day (BID) | ORAL | Status: DC
Start: 2014-05-13 — End: 2015-04-21

## 2014-05-13 MED ORDER — AMLODIPINE 5 MG TAB
5 mg | ORAL_TABLET | Freq: Every day | ORAL | Status: DC
Start: 2014-05-13 — End: 2015-04-21

## 2014-05-13 MED ORDER — AMLODIPINE 10 MG TAB
10 mg | ORAL_TABLET | Freq: Every day | ORAL | Status: DC
Start: 2014-05-13 — End: 2014-05-13

## 2014-05-13 NOTE — Progress Notes (Signed)
History of Present Illness: Sara Hopkins is a 64 y.o. female presents for follow-up of diabetes and HTN.  Comes to review labs.  Had ARF in 04/19/2014, stopped indapamide, amlodipine and metformin.    Of note, she said she had vomiting on 12/14.  Sewer had backed up on 12/10.    Diabetes - glucoses higher off metformin- was taking 1 g BID        Past Medical History   Diagnosis Date   ??? Asthma    ??? Diabetes ([place])    ??? Hypertension      Current Outpatient Prescriptions   Medication Sig   ??? lisinopril (PRINIVIL, ZESTRIL) 40 mg tablet Take 1 Tab by mouth daily.   ??? indapamide (LOZOL) 1.25 mg tablet Take 1 Tab by mouth daily. For blood pressure   ??? amLODIPine (NORVASC) 10 mg tablet Take 1 Tab by mouth daily.   ??? metFORMIN (GLUCOPHAGE) 1,000 mg tablet TAKE ONE TABLET BY MOUTH TWICE A DAY WITH MEALS     No current facility-administered medications for this visit.     Allergies   Allergen Reactions   ??? Latex Hives and Swelling   ??? Iodine Swelling   ??? Penicillins Hives   ??? Salicylates Hives   ??? Shellfish Derived Swelling     Physical Examination:  - Blood pressure 184/114, pulse 80, height 5' 6" (1.676 m), weight 186 lb 3.2 oz (84.46 kg).  - General: pleasant, no distress, normal gait   HEENT: hearing intact, EOMI, clear sclera without icterus  - Cardiovascular: regular, normal rate  - Respiratory: normal effort  - Integumentary: no edema  - Psychiatric: normal mood and affect    Data Reviewed:   Component      Latest Ref Rng 05/04/2014 05/04/2014 05/04/2014 04/19/2014          12:37 PM 12:37 PM 12:37 PM 11:58 AM   Glucose      65 - 99 mg/dL 193 (H)   160 (H)   BUN      8 - 27 mg/dL 12   29 (H)   Creatinine      0.57 - 1.00 mg/dL 0.75   2.74 (H)   GFR est non-AA      >59 mL/min/1.73 85   18 (L)   GFR est AA      >59 mL/min/1.73 97   20 (L)   BUN/Creatinine ratio      11 - _0 Sodium      134 - 144 mmol/L 141   142   Potassium      3.5 - 5.2 mmol/L 3.7   3.9   Chloride      97 - 108 mmol/L 101   92 (L)   CO2       18 - 29 mmol/L 26   26   Calcium      8.7 - 10.3 mg/dL 9.5   10.9 (H)   Protein, total      6.0 - 8.5 g/dL 6.8      Albumin      3.6 - 4.8 g/dL 4.2      GLOBULIN, TOTAL      1.5 - 4.5 g/dL 2.6      A-G Ratio      1.1 - 2.5 1.6      Bilirubin, total      0.0 - 1.2 mg/dL 0.4      Alk. phosphatase  39 - 117 IU/L 58      AST      0 - 40 IU/L 16      ALT      0 - 32 IU/L 13      PTH, Intact      15 - 65 pg/mL  11 (L)     VITAMIN D, 25-HYDROXY      30.0 - 100.0 ng/mL   29.6 (L)      Component      Latest Ref Rng 04/19/2014 11/01/2013 11/01/2013 11/01/2013          11:58 AM 12:00 PM 12:00 PM 12:00 PM   Cholesterol, total      100 - 199 mg/dL  147     Triglyceride      0 - 149 mg/dL  110     HDL Cholesterol      >39 mg/dL  41     VLDL, calculated      5 - 40 mg/dL  22     LDL, calculated      0 - 99 mg/dL  84     Creatinine, urine      15.0 - 278.0 mg/dL   116.7    Microalbumin, urine      0.0 - 17.0 ug/mL   23.1 (H)    Microalbumin/Creat. Ratio      0.0 - 30.0 mg/g creat   19.8    Hemoglobin A1c, (calculated)      4.8 - 5.6 % 7.5 (H)   7.3 (H)       Assessment/Plan:   1. Type 2 diabetes mellitus without complication (HCC)   - resume metformin at 500 mg daily, then 500 mg BID, then 1000 mg BID over next 1-2 months.     2. Essential hypertension   - change lisinopril to 20 mg BID  - add back amlodipine, but at 20 mg daily .     3.      Abnormal renal functioning.  - suspect was due to GI illness, dehydration and concurrent indapamide dosing  Patient Instructions   Diabetes:  Resume metformin at 500 mg after dinner for a few days, then increase to twice daily after meals for 2-4 weeks, then increase to 1000 mg twice daily, if tolerating    Continue efforts watch carbohydrate intake and aim to keep this < 45 grams per meal  Continue efforts with exercise most days    Check sugars periodically 2 hours after meals like you are doing  Goals are 90-150 (definitely < 180)    Blood pressure:  Continue lisinopril   Add back amlodipine - 5 mg daily        Follow-up Disposition:  Return in about 6 months (around 11/11/2014), or as scheduled.    Copy sent to:

## 2014-05-13 NOTE — Patient Instructions (Signed)
Diabetes:  Resume metformin at 500 mg after dinner for a few days, then increase to twice daily after meals for 2-4 weeks, then increase to 1000 mg twice daily, if tolerating    Continue efforts watch carbohydrate intake and aim to keep this < 45 grams per meal  Continue efforts with exercise most days    Check sugars periodically 2 hours after meals like you are doing  Goals are 90-150 (definitely < 180)    Blood pressure:  Continue lisinopril  Add back amlodipine - 5 mg daily

## 2014-05-15 NOTE — Progress Notes (Signed)
Quick Note:        Reviewed at visit on 05/13/2014        ______

## 2014-08-10 ENCOUNTER — Ambulatory Visit: Admit: 2014-08-10 | Discharge: 2014-08-10 | Payer: MEDICARE | Attending: Internal Medicine

## 2014-08-10 DIAGNOSIS — Z1239 Encounter for other screening for malignant neoplasm of breast: Secondary | ICD-10-CM

## 2014-08-10 NOTE — Addendum Note (Signed)
Addended by: Montez MoritaPOPOVICH, Soriah Leeman J on: 08/10/2014 02:11 PM      Modules accepted: Level of Service

## 2014-08-10 NOTE — Patient Instructions (Signed)
Please call Genevieve Norlander at [phone] to have your test scheduled.    --------------------------------------------------------------------  Colon Cancer Screening: Care Instructions  Your Care Instructions  Colorectal cancer occurs in the colon or rectum. That's the lower part of your digestive system. It is the second-leading cause of cancer deaths in the [place]. It often starts with small growths called polyps in the colon or rectum. Polyps are usually found with screening tests. Depending on the type of test, any polyps found may be removed during the tests.  Colorectal cancer usually does not cause symptoms at first. But regular tests can help find it early, before it spreads and becomes harder to treat. Experts advise routine tests for colon cancer for people starting at age 27. And they advise people with a higher risk of colon cancer to get tested sooner. Talk with your doctor about when you should start testing. Discuss which tests you need.  Follow-up care is a key part of your treatment and safety. Be sure to make and go to all appointments, and call your doctor if you are having problems. It's also a good idea to know your test results and keep a list of the medicines you take.  What are the main screening tests for colon cancer?  ?? Stool tests. These include the fecal occult blood test (FOBT), the fecal immunochemical test (FIT), and the stool DNA test (sDNA). These tests check stool samples for signs of cancer. If your test is positive, you may need to have a colonoscopy.  ?? Sigmoidoscopy. This test lets your doctor look at the lining of your rectum and the lowest part of your colon. Your doctor uses a lighted tube called a sigmoidoscope. The test can also be used to remove polyps or get a tissue sample (biopsy). This test can't find cancers or polyps in the upper part of your colon.  ?? Colonoscopy. This test lets your doctor look at the lining of your  rectum and your entire colon. The doctor uses a thin, flexible tool called a colonoscope. It can also be used to remove polyps or get a tissue sample (biopsy).  ?? Computed tomographic colonography (CTC). This test is also called a virtual colonoscopy. A computer and X-rays make a detailed picture of the colon to look for polyps. If this test finds polyps, you may need to have a colonoscopy.  What tests do you need?  The following guidelines are for people age 37 and over who are not at high risk for colorectal cancer. You should have at least one of these tests as directed by your doctor.  ?? Fecal occult blood test (FOBT) or fecal immunochemical test (FIT) each year  ?? Stool DNA test (sDNA) every 3 years  ?? Sigmoidoscopy every 5 years  ?? Colonoscopy every 10 years  If you are age 26 and have had regular screenings or are age 26 or older, you may not need screening.  Talk with your doctor about when you need to be tested. And discuss which tests are right for you.  Your doctor may recommend earlier or more frequent testing if you:  ?? Have had colorectal cancer before.  ?? Have had colon polyps.  ?? Have symptoms of colorectal cancer. These include blood in your stool and changes in your bowel habits.  ?? Have a parent, brother or sister, or child with colon polyps or colorectal cancer.  ?? Have a bowel disease. This includes ulcerative colitis and Crohn's disease.  ?? Have a  rare polyp syndrome that runs in families, such as familial adenomatous polyposis (FAP).  ?? Have had radiation treatments to the belly or pelvis.  When should you call for help?  Call your doctor now or seek immediate medical care if:  ?? Your stools are black and tarlike or have streaks of blood.  ?? You have pain or tenderness in your lower belly.  Watch closely for changes in your health, and be sure to contact your doctor if:  ?? You have diarrhea, constipation, or another change in bowel habits that does not get better.   ?? Your stools are narrower than usual.  ?? You lose weight and you don't know why.  ?? You have any questions about your screening tests or schedule.   Where can you learn more?   Go to [email or web address].net/[email or web address]  Enter M541 in the search box to learn more about "Colon Cancer Screening: Care Instructions."   ?? 2006-2015 Healthwise, Incorporated. Care instructions adapted under license by [employer] (which disclaims liability or warranty for this information). This care instruction is for use with your licensed healthcare professional. If you have questions about a medical condition or this instruction, always ask your healthcare professional. Healthwise, Incorporated disclaims any warranty or liability for your use of this information.  Content Version: 10.7.482551; Current as of: June 24, 2013

## 2014-08-10 NOTE — Progress Notes (Signed)
Assessment/Plan:     Sara Hopkins was seen today for check up.    Diagnoses and all orders for this visit:    Screening for breast cancer:  Orders:  -     MAM MAMMO BI SCREENING DIGTL; Future    Colon cancer screening:  Orders:  -     REFERRAL TO GASTROENTEROLOGY    Osteoporosis screening:  Orders:  -     DEXA BONE DENSITY STUDY AXIAL; Future    Diabetes Mellitus type 2: incomplete control, she would like endocrinology to continue managing this and does not want me to do so.      Cervical cancer screening: she agrees to have this completed, prefers to do so with female provider.        She refuses immunization.  She needs screening for hepatitis c with the next blood draw, does not have any clear risk factors for this.      Montez Morita, MD     Date of Encounter: 08/10/2014  Sara Hopkins expressed understanding of this plan. An AVS was printed and given to the patient.    ------------------------------------------------------------------------------    Chief Complaint   Patient presents with   ??? Check Up          History of Present Illness:      Sara Hopkins is a 64 y.o. female who presents for a wellness visit.  Has not had several recommended routine screening tests.  Asks about expense for these.      Past Medical History   Diagnosis Date   ??? Asthma    ??? Diabetes (HCC)    ??? Hypertension      Past Surgical History   Procedure Laterality Date   ??? Hx shoulder arthroscopy       Current Outpatient Prescriptions   Medication Sig Dispense Refill   ??? metFORMIN (GLUCOPHAGE) 500 mg tablet Take 1 Tab by mouth two (2) times daily (with meals). (Patient taking differently: Take 1,000 mg by mouth two (2) times daily (with meals).) 60 Tab 10   ??? amLODIPine (NORVASC) 5 mg tablet Take 1 Tab by mouth daily. 90 Tab 3   ??? lisinopril (PRINIVIL, ZESTRIL) 40 mg tablet Take 1 Tab by mouth daily. 90 Tab 3     Allergies   Allergen Reactions   ??? Latex Hives and Swelling   ??? Iodine Swelling   ??? Penicillins Hives   ??? Salicylates Hives    ??? Shellfish Derived Swelling     History   Substance Use Topics   ??? Smoking status: Never Smoker    ??? Smokeless tobacco: Not on file   ??? Alcohol Use: No     A 4 system ROS was negative including hematologic, cardiac, pulmonary, skin, except as noted in HPI    Physical Exam    BP 122/74 mmHg   Pulse 75   Temp(Src) 98.6 ??F (37 ??C) (Oral)   Resp 18   Ht  (1.676 m)   Wt 181 lb (82.101 kg)   BMI 29.23 kg/m2   SpO2 97%    Alert and oriented x 3  Non icteric  Neuro grossly intact  Hearing grossly intact  Non labored respirations  Pulse is regular, No edema in legs   Abdomen soft  Normal gait    Lab Results   Component Value Date/Time    HEMOGLOBIN A1C 7.5 04/19/2014 11:58 AM     Lab Results   Component Value Date/Time  SODIUM 141 05/04/2014 12:37 PM    POTASSIUM 3.7 05/04/2014 12:37 PM    CHLORIDE 101 05/04/2014 12:37 PM    CO2 26 05/04/2014 12:37 PM    GLUCOSE 193 05/04/2014 12:37 PM    BUN 12 05/04/2014 12:37 PM    CREATININE 0.75 05/04/2014 12:37 PM    BUN/CREATININE RATIO 16 05/04/2014 12:37 PM    GFR EST AA 97 05/04/2014 12:37 PM    GFR EST NON-AA 85 05/04/2014 12:37 PM    CALCIUM 9.5 05/04/2014 12:37 PM       Lab Results   Component Value Date/Time    CHOLESTEROL, TOTAL 147 11/01/2013 12:00 PM    HDL CHOLESTEROL 41 11/01/2013 12:00 PM    LDL, CALCULATED 84 11/01/2013 12:00 PM    VLDL, CALCULATED 22 11/01/2013 12:00 PM    TRIGLYCERIDE 110 11/01/2013 12:00 PM

## 2014-08-10 NOTE — Progress Notes (Signed)
1. Have you been to the ER, urgent care clinic since your last visit?  Hospitalized since your last visit?No    2. Have you seen or consulted any other health care providers outside of the [facility] since your last visit?  Include any pap smears or colon screening. No     Pt is here for   Chief Complaint   Patient presents with   ??? Check Up     pt states she's here for a check... pt states she would like an epi-pen     Pt states no pain at this time... Pt states she feels great

## 2014-08-15 ENCOUNTER — Inpatient Hospital Stay: Payer: MEDICARE

## 2014-08-15 LAB — GLUCOSE, POC: Glucose (POC): 92 mg/dL (ref 65–100)

## 2014-08-15 MED ORDER — SODIUM CHLORIDE 0.9 % IJ SYRG
INTRAMUSCULAR | Status: DC | PRN
Start: 2014-08-15 — End: 2014-08-15

## 2014-08-15 MED ORDER — ATROPINE 0.1 MG/ML SYRINGE
0.1 mg/mL | Freq: Once | INTRAMUSCULAR | Status: DC | PRN
Start: 2014-08-15 — End: 2014-08-15

## 2014-08-15 MED ORDER — EPINEPHRINE 0.1 MG/ML SYRINGE
0.1 mg/mL | Freq: Once | INTRAMUSCULAR | Status: DC | PRN
Start: 2014-08-15 — End: 2014-08-15

## 2014-08-15 MED ORDER — FLUMAZENIL 0.1 MG/ML IV SOLN
0.1 mg/mL | INTRAVENOUS | Status: DC | PRN
Start: 2014-08-15 — End: 2014-08-15

## 2014-08-15 MED ORDER — SODIUM CHLORIDE 0.9 % IJ SYRG
Freq: Three times a day (TID) | INTRAMUSCULAR | Status: DC
Start: 2014-08-15 — End: 2014-08-15

## 2014-08-15 MED ORDER — NALOXONE 0.4 MG/ML INJECTION
0.4 mg/mL | INTRAMUSCULAR | Status: DC | PRN
Start: 2014-08-15 — End: 2014-08-15

## 2014-08-15 MED ORDER — MIDAZOLAM 1 MG/ML IJ SOLN
1 mg/mL | INTRAMUSCULAR | Status: DC | PRN
Start: 2014-08-15 — End: 2014-08-15

## 2014-08-15 MED ORDER — SODIUM CHLORIDE 0.9 % IV
INTRAVENOUS | Status: DC
Start: 2014-08-15 — End: 2014-08-15
  Administered 2014-08-15: 17:00:00 via INTRAVENOUS

## 2014-08-15 MED ORDER — PROPOFOL 10 MG/ML IV EMUL
10 mg/mL | INTRAVENOUS | Status: DC | PRN
Start: 2014-08-15 — End: 2014-08-15
  Administered 2014-08-15: 18:00:00 via INTRAVENOUS

## 2014-08-15 MED ORDER — SIMETHICONE 40 MG/0.6 ML ORAL DROPS, SUSP
40 mg/0.6 mL | ORAL | Status: DC | PRN
Start: 2014-08-15 — End: 2014-08-15

## 2014-08-15 MED ORDER — LIDOCAINE (PF) 20 MG/ML (2 %) IJ SOLN
20 mg/mL (2 %) | INTRAMUSCULAR | Status: DC | PRN
Start: 2014-08-15 — End: 2014-08-15
  Administered 2014-08-15: 18:00:00 via INTRAVENOUS

## 2014-08-15 MED FILL — SODIUM CHLORIDE 0.9 % IV: INTRAVENOUS | Qty: 1000

## 2014-08-15 NOTE — Other (Signed)
Sara SailorsJanie H Hopkins  [birth year]  [id number]    Situation:  Verbal report received from: B. Wynonia HazardKiltz rn  Procedure: Procedure(s):  COLONOSCOPY    Background:    Preoperative diagnosis: SCREENING   Postoperative diagnosis: melanosis coli; diverticulosis    Operator:  Dr. Boyce MediciKeate  Assistant(s): Endoscopy Technician-1: Albertine Gratesimothy Loughran  Endoscopy RN-1: Reinaldo MeekerBonnie Kiltz    Specimens: * No specimens in log *  H. Pylori  no    Assessment:  Intra-procedure medications   Anesthesia gave intra-procedure sedation and medications, see anesthesia flow sheet yes    Intravenous fluids: NS@ KVO     Vital signs stable     Abdominal assessment: round and soft     Recommendation:  Discharge patient per MD order.    Family or Friend - friend  Permission to share finding with family or friend yes

## 2014-08-15 NOTE — Other (Signed)
Anesthesia reports 220mg Propofol, 100mg Lidocaine and 200mL NS given during procedure.  Received report from anesthesia staff on vital signs and status of patient.

## 2014-08-15 NOTE — H&P (Signed)
Pre-endoscopy H and P    The patient was seen and examined in the endoscopy suite.  The airway was assessed and docuemented.  The problem list, past medical history, and medications were reviewed.     Patient Active Problem List   Diagnosis Code   ??? Type 2 diabetes mellitus E11.9   ??? Hypertension I10   ??? Hyperlipidemia E78.5   ??? Goiter E04.9   ??? Asthma J45.909   ??? Screen for colon cancer Z12.11     History     Social History   ??? Marital Status: SINGLE     Spouse Name: N/A   ??? Number of Children: N/A   ??? Years of Education: N/A     Occupational History   ??? Not on file.     Social History Main Topics   ??? Smoking status: Never Smoker    ??? Smokeless tobacco: Not on file   ??? Alcohol Use: No   ??? Drug Use: No   ??? Sexual Activity: Not on file     Other Topics Concern   ??? Not on file     Social History Narrative     Past Medical History   Diagnosis Date   ??? Asthma    ??? Diabetes (HCC)    ??? Hypertension    ??? Screen for colon cancer 08/15/2014     The patient has a family history of an    Prior to Admission Medications   Prescriptions Last Dose Informant Patient Reported? Taking?   amLODIPine (NORVASC) 5 mg tablet 08/15/2014 at Unknown time  No Yes   Sig: Take 1 Tab by mouth daily.   lisinopril (PRINIVIL, ZESTRIL) 40 mg tablet 08/15/2014 at Unknown time  No Yes   Sig: Take 1 Tab by mouth daily.   metFORMIN (GLUCOPHAGE) 500 mg tablet 08/15/2014 at Unknown time  No Yes   Sig: Take 1 Tab by mouth two (2) times daily (with meals).   Patient taking differently: Take 1,000 mg by mouth two (2) times daily (with meals).      Facility-Administered Medications: None           The review of systems is:  negative for shortness of breath or chest pain      The heart, lungs, and mental status were satisfactory for the administration of anesthesia sedation and for the procedure.      I discussed with the patient the objectives, risks, consequences and alternatives to the procedure.      Gwenette Greetay F Voyd Groft, MD  08/15/2014  1:39 PM

## 2014-08-15 NOTE — Anesthesia Pre-Procedure Evaluation (Signed)
Anesthetic History   No history of anesthetic complications            Review of Systems / Medical History  Patient summary reviewed, nursing notes reviewed and pertinent labs reviewed    Pulmonary            Asthma        Neuro/Psych   Within defined limits           Cardiovascular    Hypertension              Exercise tolerance: >4 METS     GI/Hepatic/Renal  Within defined limits              Endo/Other    Diabetes  Hypothyroidism       Other Findings              Physical Exam    Airway  Mallampati: III  TM Distance: 4 - 6 cm  Neck ROM: normal range of motion   Mouth opening: Normal     Cardiovascular  Regular rate and rhythm,  S1 and S2 normal,  no murmur, click, rub, or gallop             Dental  No notable dental hx       Pulmonary  Breath sounds clear to auscultation               Abdominal  GI exam deferred       Other Findings            Anesthetic Plan    ASA: 2  Anesthesia type: general          Induction: Intravenous  Anesthetic plan and risks discussed with: Patient

## 2014-08-15 NOTE — Anesthesia Post-Procedure Evaluation (Signed)
Post-Anesthesia Evaluation and Assessment    Patient: Sara Hopkins MRN: [id number]  SSN: [id number]    Date of Birth: 30-May-1950  Age: 64 y.o.  Sex: female       Cardiovascular Function/Vital Signs  Visit Vitals   Item Reading   ??? BP 157/97 mmHg   ??? Pulse 78   ??? Temp 36.6 ??C (97.8 ??F)   ??? Resp 14   ??? Ht 5' 3.25" (1.607 m)   ??? Wt 81.194 kg (179 lb)   ??? BMI 31.44 kg/m2   ??? SpO2 97%       Patient is status post total IV anesthesia anesthesia for Procedure(s):  COLONOSCOPY.    Nausea/Vomiting: None    Postoperative hydration reviewed and adequate.    Pain:  Pain Scale 1: Visual (08/15/14 1431)  Pain Intensity 1: 0 (08/15/14 1431)   Managed    Neurological Status:       At baseline    Mental Status and Level of Consciousness: Alert and oriented     Pulmonary Status:   O2 Device: Room air (08/15/14 1431)   Adequate oxygenation and airway patent    Complications related to anesthesia: None    Post-anesthesia assessment completed. No concerns    Signed By: Baker JanusShelby M Timica Marcom, DO     August 15, 2014

## 2014-08-15 NOTE — Procedures (Signed)
Colonoscopy    Indications: screen colon cancer    Pre-operative Diagnosis: screen    Medications:  See anesthesia form    Post-operative Diagnosis:  melanosis coli; diverticulosis      Procedure Details   Prior to the procedure its objectives, risks, consequences and alternatives were discussed with the patient who then elected to proceed.  All questions were answered.      Digital Rectal Exam:  was normal     The Olympus videocolonoscope was inserted in the rectum and advanced to the cecum.  The cecum was identified by typical landmarks.  The colonoscope was slowly and carefully withdrawn as the mucosa was inspected.  A few left sided diverticula were seen.  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  No other abnormalities were noted.  Retroflexion in the rectum was negative.      The preparation was adequate      Estimated Blood Loss:  none    Specimens:  none    Findings:  Melanosis  diverticula    Complications:  none    Repeat colonoscopy is recommended in:  Ten years .               Sara Jawad F Avalee Castrellon, MD  2:01 PM  08/15/2014

## 2014-08-15 NOTE — Other (Signed)
Patient wheeled to waiting car for d/c home.  Denies any complications.  Friend has pt cell phone

## 2014-08-15 NOTE — Other (Signed)
Patient ambulated to bathroom with minimum assist.  VSS, IV removed

## 2014-08-15 NOTE — Procedures (Signed)
Colonoscopy    Indications: screen colon cancer    Pre-operative Diagnosis: screen    Medications:  See anesthesia form    Post-operative Diagnosis:  melanosis coli; diverticulosis      Procedure Details   Prior to the procedure its objectives, risks, consequences and alternatives were discussed with the patient who then elected to proceed.  All questions were answered.      Digital Rectal Exam:  was normal     The Olympus videocolonoscope was inserted in the rectum and advanced to the cecum.  The cecum was identified by typical landmarks.  The colonoscope was slowly and carefully withdrawn as the mucosa was inspected.  A few left sided diverticula were seen.  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  No other abnormalities were noted.  Retroflexion in the rectum was negative.      The preparation was adequate      Estimated Blood Loss:  none    Specimens:  none    Findings:  Melanosis  diverticula    Complications:  none    Repeat colonoscopy is recommended in:  Ten years .               Sara Greetay F Gurpreet Mikhail, MD  2:01 PM  08/15/2014

## 2014-08-16 ENCOUNTER — Encounter

## 2014-08-16 MED FILL — DIPRIVAN 10 MG/ML INTRAVENOUS EMULSION: 10 mg/mL | INTRAVENOUS | Qty: 20

## 2014-08-16 MED FILL — LIDOCAINE (PF) 20 MG/ML (2 %) IJ SOLN: 20 mg/mL (2 %) | INTRAMUSCULAR | Qty: 5

## 2014-08-17 ENCOUNTER — Inpatient Hospital Stay: Admit: 2014-08-17 | Payer: MEDICARE | Attending: Internal Medicine

## 2014-08-17 DIAGNOSIS — Z1231 Encounter for screening mammogram for malignant neoplasm of breast: Secondary | ICD-10-CM

## 2014-08-17 DIAGNOSIS — M859 Disorder of bone density and structure, unspecified: Secondary | ICD-10-CM

## 2014-08-17 LAB — HM DEXA SCAN: HM Dexa Scan: NORMAL

## 2014-08-24 ENCOUNTER — Inpatient Hospital Stay: Admit: 2014-08-25 | Payer: MEDICARE

## 2014-08-24 ENCOUNTER — Ambulatory Visit: Admit: 2014-08-24 | Discharge: 2014-08-24 | Payer: MEDICARE | Attending: Family Medicine

## 2014-08-24 DIAGNOSIS — E119 Type 2 diabetes mellitus without complications: Secondary | ICD-10-CM

## 2014-08-24 DIAGNOSIS — Z124 Encounter for screening for malignant neoplasm of cervix: Secondary | ICD-10-CM

## 2014-08-24 LAB — AMB POC GLUCOSE BLOOD, BY GLUCOSE MONITORING DEVICE: Glucose POC: 132 mg/dL

## 2014-08-24 LAB — AMB POC HEMOGLOBIN A1C: Hemoglobin A1c (POC): 6.4 %

## 2014-08-24 NOTE — Progress Notes (Signed)
1. Have you been to the ER, urgent care clinic since your last visit?  Hospitalized since your last visit?No .  2. Have you seen or consulted any other health care providers outside of the [facility] since your last visit?  Include any pap smears or colon screening. No.
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
## 2014-08-27 LAB — NUSWAB VAGINITIS
C. albicans, NAA: NEGATIVE
C. glabrata, NAA: NEGATIVE
T. vaginalis, NAA: NEGATIVE

## 2014-09-08 NOTE — Addendum Note (Signed)
Addended by: Marty HeckGROGAN, SHARI K on: 09/08/2014 11:59 AM      Modules accepted: Level of Service

## 2014-09-29 NOTE — Addendum Note (Signed)
Addended by: Montez MoritaPOPOVICH, Richard Ritchey J on: 09/29/2014 11:31 AM      Modules accepted: Level of Service

## 2014-09-29 NOTE — Addendum Note (Signed)
Addended by: Loma MessingSAUNDERS, SHANNON R on: 09/29/2014 10:18 AM      Modules accepted: Level of Service

## 2014-10-06 NOTE — Telephone Encounter (Signed)
We've never had her on Advair, just symibort. Is she sure she's not looking for albuterol?

## 2014-10-06 NOTE — Telephone Encounter (Signed)
Patient will like to cancel request for medication refill for Advair.

## 2014-10-06 NOTE — Telephone Encounter (Signed)
Pt states she wants to cancel request for advair.

## 2014-10-06 NOTE — Telephone Encounter (Signed)
Pt wants rx for advair called in to [facility] please. She has asthma . Pt # [phone]

## 2014-10-07 MED ORDER — BLOOD GLUCOSE METER KIT
PACK | Status: AC
Start: 2014-10-07 — End: ?

## 2014-10-07 MED ORDER — BLOOD SUGAR DIAGNOSTIC TEST STRIPS
ORAL_STRIP | Status: DC
Start: 2014-10-07 — End: 2015-04-21

## 2014-10-09 MED ORDER — FLUTICASONE-SALMETEROL 250 MCG-50 MCG/DOSE DISK DEVICE FOR INHALATION
250-50 mcg/dose | Freq: Two times a day (BID) | RESPIRATORY_TRACT | Status: DC
Start: 2014-10-09 — End: 2015-04-21

## 2014-10-14 NOTE — Telephone Encounter (Signed)
Pt states [employer] will be faxing over a form for you to fill out for her Advair.  She states please put urgent on it and fax it back so she will not have to wait 72 hours for this medication. Pt # [phone]

## 2014-10-19 ENCOUNTER — Encounter

## 2014-10-24 NOTE — Telephone Encounter (Signed)
Form received via fax and placed on physician's desk for completion/signature

## 2014-10-25 ENCOUNTER — Ambulatory Visit: Admit: 2014-10-25 | Discharge: 2014-10-25 | Payer: MEDICARE | Attending: Internal Medicine

## 2014-10-25 DIAGNOSIS — E118 Type 2 diabetes mellitus with unspecified complications: Secondary | ICD-10-CM

## 2014-10-25 NOTE — Patient Instructions (Signed)
Diabetes:  Continue metformin 1000 mg twice daily    Continue efforts watch carbohydrate intake and aim to keep this < 45 grams per meal  Continue efforts with exercise most days    Check sugars periodically 2 hours after meals like you are doing  Goals are 90-150 (definitely < 180)    Blood pressure:  Continue lisinopril  -take at nightly  Continue amlodipine - 5 mg daily

## 2014-10-25 NOTE — Progress Notes (Signed)
History of Present Illness: Sara Hopkins is a 64 y.o. female presents for follow-up of diabetes and HTN.  Comes to review labs.    Had ARF in 04/19/2014, stopped indapamide, amlodipine and lisinopril  Lisinopril and amlodipine resumed subsequently  BP has remained higher down on repeat in clinic, but still suboptimal.     Diabetes - resumed metformin- was taking 1 g BID   Walking daily and going to gym     Weight is down 20 lbs over last 18 months.     Past Medical History   Diagnosis Date   ??? Asthma    ??? Diabetes (HCC)    ??? Hypertension    ??? Screen for colon cancer 08/15/2014     Current Outpatient Prescriptions   Medication Sig   ??? fluticasone-salmeterol (ADVAIR) 250-50 mcg/dose diskus inhaler Take 1 Puff by inhalation two (2) times a day.   ??? Blood-Glucose Meter monitoring kit Monitor glucoses daily as directed   ??? glucose blood VI test strips (ASCENSIA AUTODISC VI, ONE TOUCH ULTRA TEST VI) strip Dx E11.9. Check blood sugar daily as directed   ??? metFORMIN (GLUCOPHAGE) 500 mg tablet Take 1 Tab by mouth two (2) times daily (with meals). (Patient taking differently: Take 1,000 mg by mouth two (2) times daily (with meals).)   ??? amLODIPine (NORVASC) 5 mg tablet Take 1 Tab by mouth daily.   ??? lisinopril (PRINIVIL, ZESTRIL) 40 mg tablet Take 1 Tab by mouth daily.     No current facility-administered medications for this visit.     Allergies   Allergen Reactions   ??? Latex Hives and Swelling   ??? Iodine Swelling   ??? Penicillins Hives   ??? Salicylates Hives   ??? Shellfish Derived Swelling       Review of Systems:  - Eyes: no blurry vision or double vision  - Cardiovascular: no chest pain  - Respiratory: no shortness of breath  - Musculoskeletal: no myalgias  - Neurological: no numbness/tingling in extremities    Physical Examination:  - Blood pressure 168/105, pulse 84, height 5' 3.25" (1.607 m), weight 177 lb (80.287 kg).  - General: pleasant, no distress, normal gait    HEENT: hearing intact, EOMI, clear sclera without icterus  - Neck: no thyromegaly or LAD  - Cardiovascular: regular, normal rate  - Respiratory: normal effort  - Integumentary: no edema  - Psychiatric: normal mood and affect    Data Reviewed:   Component      Latest Ref Rng 08/24/2014 05/04/2014 04/19/2014 11/01/2013          11:45 AM 12:37 PM 11:58 AM 12:00 PM   Glucose      65 - 99 mg/dL  831 (H)     BUN      8 - 27 mg/dL  12     Creatinine      0.57 - 1.00 mg/dL  5.17     GFR est non-AA      >59 mL/min/1.73  85     GFR est AA      >59 mL/min/1.73  97     BUN/Creatinine ratio      11 - 26  16     Sodium      134 - 144 mmol/L  141     Potassium      3.5 - 5.2 mmol/L  3.7     Chloride      97 - 108 mmol/L  101     CO2  18 - 29 mmol/L  26     Calcium      8.7 - 10.3 mg/dL  9.5     Protein, total      6.0 - 8.5 g/dL  6.8     Albumin      3.6 - 4.8 g/dL  4.2     GLOBULIN, TOTAL      1.5 - 4.5 g/dL  2.6     A-G Ratio      1.1 - 2.5  1.6     Bilirubin, total      0.0 - 1.2 mg/dL  0.4     Alk. phosphatase      39 - 117 IU/L  58     AST      0 - 40 IU/L  16     ALT      0 - 32 IU/L  13     Cholesterol, total      100 - 199 mg/dL    147   Triglyceride      0 - 149 mg/dL    110   HDL Cholesterol      >39 mg/dL    41   VLDL, calculated      5 - 40 mg/dL    22   LDL, calculated      0 - 99 mg/dL    84   Creatinine, urine      15.0 - 278.0 mg/dL       Microalbumin, urine      0.0 - 17.0 ug/mL       Microalbumin/Creat. Ratio      0.0 - 30.0 mg/g creat       Hemoglobin A1c, (calculated)      4.8 - 5.6 %   7.5 (H)    Hemoglobin A1c (POC)       6.4        Component      Latest Ref Rng 11/01/2013          12:00 PM   Glucose      65 - 99 mg/dL    BUN      8 - 27 mg/dL    Creatinine      0.57 - 1.00 mg/dL    GFR est non-AA      >59 mL/min/1.73    GFR est AA      >59 mL/min/1.73    BUN/Creatinine ratio      11 - 26    Sodium      134 - 144 mmol/L    Potassium      3.5 - 5.2 mmol/L    Chloride      97 - 108 mmol/L    CO2       18 - 29 mmol/L    Calcium      8.7 - 10.3 mg/dL    Protein, total      6.0 - 8.5 g/dL    Albumin      3.6 - 4.8 g/dL    GLOBULIN, TOTAL      1.5 - 4.5 g/dL    A-G Ratio      1.1 - 2.5    Bilirubin, total      0.0 - 1.2 mg/dL    Alk. phosphatase      39 - 117 IU/L    AST      0 - 40 IU/L    ALT      0 - 32 IU/L  Cholesterol, total      100 - 199 mg/dL    Triglyceride      0 - 149 mg/dL    HDL Cholesterol      >39 mg/dL    VLDL, calculated      5 - 40 mg/dL    LDL, calculated      0 - 99 mg/dL    Creatinine, urine      15.0 - 278.0 mg/dL 116.7   Microalbumin, urine      0.0 - 17.0 ug/mL 23.1 (H)   Microalbumin/Creat. Ratio      0.0 - 30.0 mg/g creat 19.8   Hemoglobin A1c, (calculated)      4.8 - 5.6 %    Hemoglobin A1c (POC)              Assessment/Plan:   1. Type 2 diabetes mellitus with complication (HCC)   - continue metformin  - congratulated her on her weight loss.   - encouraged continued efforts with diet, exercise and weight loss   2. HTN (hypertension), benign   - recommended taking lisinopril at bedtime  - continue amlodipine  - discussed resuming indapamide, but she wanted to avoid diuretic - felt it was making her 'skin wrinkly'.       Patient Instructions   Diabetes:  Continue metformin 1000 mg twice daily    Continue efforts watch carbohydrate intake and aim to keep this < 45 grams per meal  Continue efforts with exercise most days    Check sugars periodically 2 hours after meals like you are doing  Goals are 90-150 (definitely < 180)    Blood pressure:  Continue lisinopril  -take at nightly  Continue amlodipine - 5 mg daily        Follow-up Disposition:  Return in about 3 months (around 01/25/2015).    Copy sent to:

## 2014-11-11 NOTE — Progress Notes (Signed)
Patient is requesting a Tier Exception for Advair Diskus 250/50. Contacted [employer] Alternative for Advair Diskus include the following Breo Ellipta and Symbicort.

## 2014-11-24 MED ORDER — LISINOPRIL 40 MG TAB
40 mg | ORAL_TABLET | Freq: Every day | ORAL | Status: DC
Start: 2014-11-24 — End: 2015-04-21

## 2014-11-24 NOTE — Telephone Encounter (Signed)
[employer] faxed a refill request.

## 2014-11-28 NOTE — Addendum Note (Signed)
Addended by: Alonna Buckler A on: 11/28/2014 10:02 AM      Modules accepted: Level of Service

## 2014-12-14 MED ORDER — METFORMIN 1,000 MG TAB
1000 mg | ORAL_TABLET | ORAL | Status: DC
Start: 2014-12-14 — End: 2015-04-21

## 2014-12-14 NOTE — Telephone Encounter (Signed)
-----   Message from Maia Planenata A Lucas sent at 12/14/2014 10:36 AM EDT -----  Regarding: refill request (1)  Contact: [phone]  12/14/2014  10:37 AM      Ms. Hendriks called stating that she need a 30day supply of       -metFORMIN (GLUCOPHAGE) 1000 mg tablet     -Picked up metFORMIN (GLUCOPHAGE) 500 mg tablet but will run out in a few days    -would like a 180 tablets of metFORMIN (GLUCOPHAGE) 1000 mg tablet     -Please call Ms. Birchard with any questions    [facility] - [place], [place] - [address]    Thanks

## 2015-01-19 ENCOUNTER — Encounter: Attending: Internal Medicine

## 2015-01-23 ENCOUNTER — Encounter

## 2015-01-25 ENCOUNTER — Encounter

## 2015-04-20 LAB — METABOLIC PANEL, COMPREHENSIVE
A-G Ratio: 1.5 (ref 1.1–2.5)
ALT (SGPT): 19 IU/L (ref 0–32)
AST (SGOT): 10 IU/L (ref 0–40)
Albumin: 4.4 g/dL (ref 3.6–4.8)
Alk. phosphatase: 65 IU/L (ref 39–117)
BUN/Creatinine ratio: 19 (ref 11–26)
BUN: 11 mg/dL (ref 8–27)
Bilirubin, total: 0.3 mg/dL (ref 0.0–1.2)
CO2: 28 mmol/L (ref 18–29)
Calcium: 9.7 mg/dL (ref 8.7–10.3)
Chloride: 98 mmol/L (ref 96–106)
Creatinine: 0.59 mg/dL (ref 0.57–1.00)
GFR est AA: 111 mL/min/{1.73_m2} (ref >59)
GFR est non-AA: 96 mL/min/{1.73_m2} (ref >59)
GLOBULIN, TOTAL: 2.9 g/dL (ref 1.5–4.5)
Glucose: 92 mg/dL (ref 65–99)
Potassium: 3.9 mmol/L (ref 3.5–5.2)
Protein, total: 7.3 g/dL (ref 6.0–8.5)
Sodium: 143 mmol/L (ref 134–144)

## 2015-04-20 LAB — LIPID PANEL
Cholesterol, total: 178 mg/dL (ref 100–199)
HDL Cholesterol: 58 mg/dL (ref >39)
LDL, calculated: 103 mg/dL — ABNORMAL HIGH (ref 0–99)
Triglyceride: 87 mg/dL (ref 0–149)
VLDL, calculated: 17 mg/dL (ref 5–40)

## 2015-04-20 LAB — HEMOGLOBIN A1C WITH EAG
Estimated average glucose: 140 mg/dL
Hemoglobin A1c: 6.5 % — ABNORMAL HIGH (ref 4.8–5.6)

## 2015-04-20 LAB — MICROALBUMIN, UR, RAND W/ MICROALB/CREAT RATIO
Creatinine, urine random: 113.3 mg/dL
Microalb/Creat ratio (ug/mg creat.): 17 mg/g creat (ref 0.0–30.0)
Microalbumin, urine: 19.3 ug/mL

## 2015-04-20 LAB — TSH 3RD GENERATION: TSH: 1.13 u[IU]/mL (ref 0.450–4.500)

## 2015-04-20 NOTE — Progress Notes (Signed)
Will review at visit on 04/21/2015

## 2015-04-21 ENCOUNTER — Ambulatory Visit: Admit: 2015-04-21 | Discharge: 2015-04-21 | Payer: MEDICARE | Attending: Internal Medicine

## 2015-04-21 DIAGNOSIS — E119 Type 2 diabetes mellitus without complications: Secondary | ICD-10-CM

## 2015-04-21 MED ORDER — METFORMIN 1,000 MG TAB
1000 mg | ORAL_TABLET | Freq: Two times a day (BID) | ORAL | 10 refills | Status: DC
Start: 2015-04-21 — End: 2015-12-15

## 2015-04-21 MED ORDER — AMLODIPINE 5 MG TAB
5 mg | ORAL_TABLET | Freq: Every day | ORAL | 10 refills | Status: DC
Start: 2015-04-21 — End: 2015-12-15

## 2015-04-21 MED ORDER — LISINOPRIL 40 MG TAB
40 mg | ORAL_TABLET | Freq: Every day | ORAL | 10 refills | Status: DC
Start: 2015-04-21 — End: 2015-12-15

## 2015-04-21 MED ORDER — BLOOD SUGAR DIAGNOSTIC TEST STRIPS
ORAL_STRIP | 11 refills | Status: AC
Start: 2015-04-21 — End: ?

## 2015-04-21 NOTE — Progress Notes (Signed)
History of Present Illness: Sara Hopkins is a 64 y.o. female presents for follow-up of diabetes.  Comes to review labs.    Had ARF in 04/19/2014, stopped indapamide, amlodipine and lisinopril  Lisinopril and amlodipine resumed subsequently  BP has remained higher.   Had salty food last night at a Christmas Party.  She does appear salt sensitive.     Diabetes - taking 1 g BID    Walking daily and going to gym     Weight loss continues. Down 3 lbs this year.       Past Medical History   Diagnosis Date   ??? Asthma    ??? Diabetes ([place])    ??? Hypertension    ??? Screen for colon cancer 08/15/2014     Current Outpatient Prescriptions   Medication Sig   ??? metFORMIN (GLUCOPHAGE) 1,000 mg tablet TAKE ONE TABLET BY MOUTH TWICE A DAY WITH MEALS   ??? lisinopril (PRINIVIL, ZESTRIL) 40 mg tablet Take 1 Tab by mouth daily.   ??? Blood-Glucose Meter monitoring kit Monitor glucoses daily as directed   ??? glucose blood VI test strips (ASCENSIA AUTODISC VI, ONE TOUCH ULTRA TEST VI) strip Dx E11.9. Check blood sugar daily as directed   ??? amLODIPine (NORVASC) 5 mg tablet Take 1 Tab by mouth daily.   ??? fluticasone-salmeterol (ADVAIR) 250-50 mcg/dose diskus inhaler Take 1 Puff by inhalation two (2) times a day.   ??? metFORMIN (GLUCOPHAGE) 500 mg tablet Take 1 Tab by mouth two (2) times daily (with meals). (Patient taking differently: Take 1,000 mg by mouth two (2) times daily (with meals).)     No current facility-administered medications for this visit.      Allergies   Allergen Reactions   ??? Latex Hives and Swelling   ??? Iodine Swelling   ??? Penicillins Hives   ??? Salicylates Hives   ??? Shellfish Derived Swelling     Review of Systems:  - Eyes: needs glasses  - Cardiovascular: no chest pain  - Respiratory: no shortness of breath  - Musculoskeletal: no myalgias  - Neurological: no numbness/tingling in extremities    Physical Examination:  Visit Vitals   ??? BP (!) 169/103   ??? Pulse 82   ??? Ht 5' 3.25" (1.607 m)   ??? Wt 176 lb 12.8 oz (80.2 kg)    ??? BMI 31.07 kg/m2   -   - General: pleasant, no distress, normal gait   HEENT: hearing intact, EOMI, clear sclera without icterus  - Cardiovascular: regular, normal rate   - Respiratory: normal effort  - Integumentary: no edema   Diabetic foot exam:     Left: Filament test normal sensation with micro filament   Pulse DP: 2+ (normal)   Deformities: None    - Psychiatric: normal mood and affect    Data Reviewed:   Component      Latest Ref Rng 04/19/2015 04/19/2015 04/19/2015 04/19/2015          12:14 PM 12:09 PM 12:09 PM 12:09 PM   Glucose      65 - 99 mg/dL   92    BUN      8 - 27 mg/dL   11    Creatinine      0.57 - 1.00 mg/dL   0.59    GFR est non-AA      >59 mL/min/1.73   96    GFR est AA      >59 mL/min/1.73   111  BUN/Creatinine ratio      11 - 26   19    Sodium      134 - 144 mmol/L   143    Potassium      3.5 - 5.2 mmol/L   3.9    Chloride      96 - 106 mmol/L   98    CO2      18 - 29 mmol/L   28    Calcium      8.7 - 10.3 mg/dL   9.7    Protein, total      6.0 - 8.5 g/dL   7.3    Albumin      3.6 - 4.8 g/dL   4.4    GLOBULIN, TOTAL      1.5 - 4.5 g/dL   2.9    A-G Ratio      1.1 - 2.5   1.5    Bilirubin, total      0.0 - 1.2 mg/dL   0.3    Alk. phosphatase      39 - 117 IU/L   65    AST      0 - 40 IU/L   10    ALT      0 - 32 IU/L   19    Cholesterol, total      100 - 199 mg/dL    178   Triglyceride      0 - 149 mg/dL    87   HDL Cholesterol      >39 mg/dL    58   VLDL, calculated      5 - 40 mg/dL    17   LDL, calculated      0 - 99 mg/dL    103 (H)   Creatinine, urine      Not Estab. mg/dL 113.3      Microalbumin, urine      Not Estab. ug/mL 19.3      Microalbumin/Creat. Ratio      0.0 - 30.0 mg/g creat 17.0      Hemoglobin A1c, (calculated)      4.8 - 5.6 %  6.5 (H)     Estimated average glucose      mg/dL  140     TSH      0.450 - 4.500 uIU/mL         SH      0.450 - 4.500 uIU/mL 1.130       Assessment/Plan:   1. Type 2 diabetes mellitus without complication, without long-term  current use of insulin (HCC)   Controlled with diet, weight loss and metformin 1g BID. Continue.   She would like to stop metformin. Encouraged further weight loss.    2. Essential hypertension   - BP was a little lower on repeat. She describes being very salt-sensitive.    Had excellent response to addition of indapamide 1.25 mg last fall, but then had relative hypotension and was pre-renal in December 2015 with labs, so she is understandably hesitant to try again.    She plans to eat a low sodium diet   3. Need for hepatitis C screening test   - will add this to pre-labs prior to 6 month visit   Greater than 50% of 25 minute visit was spent counseling the patient about above. She also wanted to discuss health maintenance.    She would like to see a new/different PCP. We discussed need for some  vaccines.      Patient Instructions   Diabetes:  Continue metformin 1000 mg twice daily    Continue efforts with diet and weight loss.     Check sugars periodically 2 hours after meals like you are doing  Goals are 90-150 (definitely < 180)    Blood pressure:  Continue lisinopril  -take at nightly  Continue amlodipine - 5 mg daily    Follow a low salt diet.  Monitor at home - goal is < 140/<90    Follow-up Disposition:  Return in about 6 months (around 10/20/2015).    Copy sent to:

## 2015-04-21 NOTE — Patient Instructions (Signed)
Diabetes:  Continue metformin 1000 mg twice daily    Continue efforts with diet and weight loss.     Check sugars periodically 2 hours after meals like you are doing  Goals are 90-150 (definitely < 180)    Blood pressure:  Continue lisinopril  -take at nightly  Continue amlodipine - 5 mg daily    Follow a low salt diet.  Monitor at home - goal is < 140/<90

## 2015-07-03 ENCOUNTER — Encounter: Attending: Internal Medicine

## 2015-10-16 NOTE — Telephone Encounter (Signed)
-----   Message from Randel PiggBritanny N Wingfield sent at 10/16/2015  9:22 AM EDT -----  Regarding: Dr. Adriana Simasook/ Telephone  Pt is requesting order for lab. Pt would like to have lab performed before her appt on 11/30/15. Pt needs labs for A1C and PH level Best(C)[phone]

## 2015-10-16 NOTE — Telephone Encounter (Signed)
PH level to determine is her blood is acidic or basic?   PTH value?    PH is done as part of a panel for blood gases (generally with an arterial blood draw). This would only be indicated if she was having breathing problems or if the CO2 (bicarbonate) in her blood was abnormal.  Her CO2 was normal. Typically only hospitalized patients have this test done. We'd need to see what concerns she has.

## 2015-10-16 NOTE — Telephone Encounter (Signed)
Message is from answering services ([employer])    Thanks michael.

## 2015-10-16 NOTE — Telephone Encounter (Signed)
Patient called for a pre lab order. The orders are already placed. She specifically asked for a PH level to be checked. Please advise.

## 2015-10-17 ENCOUNTER — Encounter: Attending: Internal Medicine

## 2015-10-17 NOTE — Telephone Encounter (Signed)
I called patient and read Dr. Patsey Bertholdook's message and she voiced understanding. [facility] has always been checked and Dr. Adriana Simasook should have a record of this.   Lab order [place] [address], [place] [phone] per her request.

## 2015-10-20 ENCOUNTER — Encounter

## 2015-11-30 ENCOUNTER — Ambulatory Visit: Admit: 2015-11-30 | Discharge: 2015-11-30 | Payer: MEDICARE | Attending: Internal Medicine

## 2015-11-30 DIAGNOSIS — E119 Type 2 diabetes mellitus without complications: Secondary | ICD-10-CM

## 2015-11-30 NOTE — Patient Instructions (Signed)
Diabetes:  Continue efforts with diet and weight loss.     Check sugars periodically 2 hours after meals like you are doing  Goals are 90-150 (definitely < 180)    Blood pressure:  Monitor at home  Would resume lisinopril  -take at nightly and amlodipine - 5 mg daily  Monitor at home - goal is < 140/<90

## 2015-11-30 NOTE — Progress Notes (Signed)
History of Present Illness: Sara Hopkins is a 65 y.o. female presents for follow-up of diabetes.  She also has hypertension  Had ARF in 04/19/2014, stopped indapamide, amlodipine and lisinopril  Lisinopril and amlodipine resumed subsequently, however, she topped taking BP medications several months ago  Feels blood pressure is elevated today due to concerns over her [facility] bills.  She is quite preoccupied about this and her Medicare insurance coverage.  She was unable to get labs prior to the visit due to the [place]  It is unclear whether she also has bills from our office.    Diabetes - was taking metformin 1 g BID. She has stopped this.   Reports glucoses 85-130 at home    Weight loss- down about 30 pounds over the last few years, and down about 10 pounds in the last year      Past Medical History:   Diagnosis Date   ??? Asthma    ??? Diabetes ([place])    ??? Hypertension    ??? Screen for colon cancer 08/15/2014     Current Outpatient Prescriptions   Medication Sig   ??? metFORMIN (GLUCOPHAGE) 1,000 mg tablet Take 1 Tab by mouth two (2) times daily (with meals).   ??? lisinopril (PRINIVIL, ZESTRIL) 40 mg tablet Take 1 Tab by mouth daily.   ??? amLODIPine (NORVASC) 5 mg tablet Take 1 Tab by mouth daily.   ??? glucose blood VI test strips (ASCENSIA AUTODISC VI, ONE TOUCH ULTRA TEST VI) strip Dx E11.9. Check blood sugar daily as directed   ??? Blood-Glucose Meter monitoring kit Monitor glucoses daily as directed     No current facility-administered medications for this visit.      Allergies   Allergen Reactions   ??? Latex Hives and Swelling   ??? Iodine Swelling   ??? Penicillins Hives   ??? Salicylates Hives   ??? Shellfish Derived Swelling       Physical Examination:  Visit Vitals   ??? BP (!) 185/117   ??? Pulse 97   ??? Ht 5' 3.25" (1.607 m)   ??? Wt 169 lb (76.7 kg)   ??? BMI 29.7 kg/m2   -   - General: pleasant, no distress, normal gait   HEENT: hearing intact, EOMI, clear sclera without icterus  - Cardiovascular: regular, normal rate    - Respiratory: normal effort  - Integumentary: no edema  - Psychiatric: normal mood and affect    Data Reviewed:   Component      Latest Ref Rng & Units 04/19/2015 04/19/2015 04/19/2015 04/19/2015          12:14 PM 12:09 PM 12:09 PM 12:09 PM   Glucose      65 - 99 mg/dL   92    BUN      8 - 27 mg/dL   11    Creatinine      0.57 - 1.00 mg/dL   0.59    GFR est non-AA      >59 mL/min/1.73   96    GFR est AA      >59 mL/min/1.73   111    BUN/Creatinine ratio      11 - 26   19    Sodium      134 - 144 mmol/L   143    Potassium      3.5 - 5.2 mmol/L   3.9    Chloride      96 - 106 mmol/L   98  CO2      18 - 29 mmol/L   28    Calcium      8.7 - 10.3 mg/dL   9.7    Protein, total      6.0 - 8.5 g/dL   7.3    Albumin      3.6 - 4.8 g/dL   4.4    GLOBULIN, TOTAL      1.5 - 4.5 g/dL   2.9    A-G Ratio      1.1 - 2.5   1.5    Bilirubin, total      0.0 - 1.2 mg/dL   0.3    Alk. phosphatase      39 - 117 IU/L   65    AST      0 - 40 IU/L   10    ALT (SGPT)      0 - 32 IU/L   19    Cholesterol, total      100 - 199 mg/dL    178   Triglyceride      0 - 149 mg/dL    87   HDL Cholesterol      >39 mg/dL    58   VLDL, calculated      5 - 40 mg/dL    17   LDL, calculated      0 - 99 mg/dL    103 (H)   Creatinine, urine      Not Estab. mg/dL 113.3      Microalbumin, urine      Not Estab. ug/mL 19.3      Microalbumin/Creat. Ratio      0.0 - 30.0 mg/g creat 17.0      Hemoglobin A1c, (calculated)      4.8 - 5.6 %  6.5 (H)     Estimated average glucose      mg/dL  140     TSH      0.450 - 4.500 uIU/mL         SH      0.450 - 4.500 uIU/mL 1.130       Assessment/Plan:   1. Type 2 diabetes mellitus without complication, without long-term current use of insulin (HCC)   Continue weight loss and dietary efforts.  Will hold on metformin.   2. Essential hypertension   Higher today due to being upset.  Recommend she monitor at home.  Suspect she will need to resume lisinopril and amlodipine    Greater than 50% of 15 minute visit was spent counseling the patient about above and discussing her concerns about lab bills  Will have our nurse contact [facility] representative to explore lab fees and lack of insurance coverage.  Also, will verify whether she has outstanding bills with our office.      Patient Instructions   Diabetes:  Continue efforts with diet and weight loss.     Check sugars periodically 2 hours after meals like you are doing  Goals are 90-150 (definitely < 180)    Blood pressure:  Monitor at home  Would resume lisinopril  -take at nightly and amlodipine - 5 mg daily  Monitor at home - goal is < 140/<90    Follow-up Disposition:  Return in about 3 months (around 03/01/2016).    Copy sent to:

## 2015-12-15 ENCOUNTER — Inpatient Hospital Stay
Admit: 2015-12-15 | Discharge: 2015-12-15 | Disposition: A | Discharge status: Home / Self Care | Payer: MEDICARE | Attending: Emergency Medicine

## 2015-12-15 ENCOUNTER — Emergency Department: Admit: 2015-12-15 | Payer: MEDICARE

## 2015-12-15 DIAGNOSIS — J4531 Mild persistent asthma with (acute) exacerbation: Secondary | ICD-10-CM

## 2015-12-15 LAB — METABOLIC PANEL, BASIC
Anion gap: 13 mmol/L (ref 5–15)
BUN/Creatinine ratio: 9 — ABNORMAL LOW (ref 12–20)
BUN: 9 MG/DL (ref 6–20)
CO2: 27 mmol/L (ref 21–32)
Calcium: 10.1 MG/DL (ref 8.5–10.1)
Chloride: 93 mmol/L — ABNORMAL LOW (ref 97–108)
Creatinine: 1.05 MG/DL — ABNORMAL HIGH (ref 0.55–1.02)
GFR est AA: >60 mL/min/{1.73_m2} (ref >60)
GFR est non-AA: 52 mL/min/{1.73_m2} — ABNORMAL LOW (ref >60)
Glucose: 542 mg/dL — CR (ref 65–100)
Potassium: 4.1 mmol/L (ref 3.5–5.1)
Sodium: 133 mmol/L — ABNORMAL LOW (ref 136–145)

## 2015-12-15 LAB — CBC WITH AUTOMATED DIFF
ABS. BASOPHILS: 0 10*3/uL (ref 0.0–0.1)
ABS. EOSINOPHILS: 0.1 10*3/uL (ref 0.0–0.4)
ABS. LYMPHOCYTES: 1 10*3/uL (ref 0.8–3.5)
ABS. MONOCYTES: 0.7 10*3/uL (ref 0.0–1.0)
ABS. NEUTROPHILS: 9.8 10*3/uL — ABNORMAL HIGH (ref 1.8–8.0)
BASOPHILS: 0 % (ref 0–1)
EOSINOPHILS: 1 % (ref 0–7)
HCT: 49 % — ABNORMAL HIGH (ref 35.0–47.0)
HGB: 16.8 g/dL — ABNORMAL HIGH (ref 11.5–16.0)
LYMPHOCYTES: 8 % — ABNORMAL LOW (ref 12–49)
MCH: 30.3 PG (ref 26.0–34.0)
MCHC: 34.3 g/dL (ref 30.0–36.5)
MCV: 88.4 FL (ref 80.0–99.0)
MONOCYTES: 6 % (ref 5–13)
NEUTROPHILS: 85 % — ABNORMAL HIGH (ref 32–75)
PLATELET: 155 10*3/uL (ref 150–400)
RBC: 5.54 M/uL — ABNORMAL HIGH (ref 3.80–5.20)
RDW: 12.3 % (ref 11.5–14.5)
WBC: 11.5 10*3/uL — ABNORMAL HIGH (ref 3.6–11.0)

## 2015-12-15 LAB — EKG, 12 LEAD, INITIAL
Atrial Rate: 113 {beats}/min
Calculated P Axis: 71 degrees
Calculated R Axis: 72 degrees
Calculated T Axis: -40 degrees
P-R Interval: 150 ms
Q-T Interval: 326 ms
QRS Duration: 84 ms
QTC Calculation (Bezet): 447 ms
Ventricular Rate: 113 {beats}/min

## 2015-12-15 LAB — NT-PRO BNP: NT pro-BNP: 158 PG/ML — ABNORMAL HIGH (ref 0–125)

## 2015-12-15 LAB — GLUCOSE, POC
Glucose (POC): 515 mg/dL — ABNORMAL HIGH (ref 65–100)
Glucose (POC): 563 mg/dL — ABNORMAL HIGH (ref 65–100)

## 2015-12-15 LAB — TROPONIN I: Troponin-I, Qt.: <0.04 ng/mL (ref <0.05)

## 2015-12-15 LAB — EKG 12-LEAD
Atrial Rate: 113 {beats}/min
P Axis: 71 degrees
P-R Interval: 150 ms
Q-T Interval: 326 ms
QRS Duration: 84 ms
QTc Calculation (Bazett): 447 ms
R Axis: 72 degrees
T Axis: -40 degrees
Ventricular Rate: 113 {beats}/min

## 2015-12-15 MED ORDER — IPRATROPIUM-ALBUTEROL 2.5 MG-0.5 MG/3 ML NEB SOLUTION
2.5 mg-0.5 mg/3 ml | RESPIRATORY_TRACT | Status: AC
Start: 2015-12-15 — End: 2015-12-15
  Administered 2015-12-15: 14:00:00 via RESPIRATORY_TRACT

## 2015-12-15 MED ORDER — SODIUM CHLORIDE 0.9 % IV
INTRAVENOUS | Status: DC
Start: 2015-12-15 — End: 2015-12-15
  Administered 2015-12-15: 13:00:00 via INTRAVENOUS

## 2015-12-15 MED ORDER — INSULIN REGULAR HUMAN 100 UNIT/ML INJECTION
100 unit/mL | INTRAMUSCULAR | Status: AC
Start: 2015-12-15 — End: 2015-12-15
  Administered 2015-12-15: 14:00:00 via INTRAVENOUS

## 2015-12-15 MED ORDER — SODIUM CHLORIDE 0.9% BOLUS IV
0.9 % | INTRAVENOUS | Status: AC
Start: 2015-12-15 — End: 2015-12-15
  Administered 2015-12-15: 14:00:00 via INTRAVENOUS

## 2015-12-15 MED ORDER — IPRATROPIUM-ALBUTEROL 2.5 MG-0.5 MG/3 ML NEB SOLUTION
2.5 mg-0.5 mg/3 ml | RESPIRATORY_TRACT | Status: AC
Start: 2015-12-15 — End: 2015-12-15
  Administered 2015-12-15: 13:00:00 via RESPIRATORY_TRACT

## 2015-12-15 MED ORDER — SODIUM CHLORIDE 0.9% BOLUS IV
0.9 % | Freq: Once | INTRAVENOUS | Status: AC
Start: 2015-12-15 — End: 2015-12-15
  Administered 2015-12-15: 15:00:00 via INTRAVENOUS

## 2015-12-15 MED ORDER — SODIUM CHLORIDE 0.9 % IJ SYRG
Freq: Three times a day (TID) | INTRAMUSCULAR | Status: DC
Start: 2015-12-15 — End: 2015-12-15
  Administered 2015-12-15: 14:00:00 via INTRAVENOUS

## 2015-12-15 MED ORDER — METFORMIN 1,000 MG TAB
1000 mg | ORAL_TABLET | Freq: Two times a day (BID) | ORAL | 10 refills | Status: AC
Start: 2015-12-15 — End: ?

## 2015-12-15 MED ORDER — METHYLPREDNISOLONE (PF) 125 MG/2 ML IJ SOLR
125 mg/2 mL | INTRAMUSCULAR | Status: DC
Start: 2015-12-15 — End: 2015-12-15

## 2015-12-15 MED ORDER — SODIUM CHLORIDE 0.9 % IJ SYRG
INTRAMUSCULAR | Status: DC | PRN
Start: 2015-12-15 — End: 2015-12-15

## 2015-12-15 MED ORDER — PREDNISONE 20 MG TAB
20 mg | ORAL_TABLET | Freq: Every day | ORAL | 0 refills | Status: AC
Start: 2015-12-15 — End: 2015-12-18

## 2015-12-15 MED ORDER — ALBUTEROL SULFATE HFA 90 MCG/ACTUATION AEROSOL INHALER
90 mcg/actuation | RESPIRATORY_TRACT | 1 refills | Status: AC | PRN
Start: 2015-12-15 — End: ?

## 2015-12-15 MED ORDER — INSULIN REGULAR HUMAN 100 UNIT/ML INJECTION
100 unit/mL | INTRAMUSCULAR | Status: AC
Start: 2015-12-15 — End: 2015-12-15
  Administered 2015-12-15: 15:00:00 via INTRAVENOUS

## 2015-12-15 MED ORDER — METFORMIN 1,000 MG TAB
1000 mg | ORAL_TABLET | Freq: Two times a day (BID) | ORAL | 0 refills | Status: AC
Start: 2015-12-15 — End: 2016-01-14

## 2015-12-15 MED FILL — SOLU-MEDROL (PF) 125 MG/2 ML SOLUTION FOR INJECTION: 125 mg/2 mL | INTRAMUSCULAR | Qty: 2

## 2015-12-15 MED FILL — INSULIN REGULAR HUMAN 100 UNIT/ML INJECTION: 100 unit/mL | INTRAMUSCULAR | Qty: 1

## 2015-12-15 MED FILL — SODIUM CHLORIDE 0.9 % IV: INTRAVENOUS | Qty: 1000

## 2015-12-15 MED FILL — SODIUM CHLORIDE 0.9 % IV: INTRAVENOUS | Qty: 500

## 2015-12-15 MED FILL — IPRATROPIUM-ALBUTEROL 2.5 MG-0.5 MG/3 ML NEB SOLUTION: 2.5 mg-0.5 mg/3 ml | RESPIRATORY_TRACT | Qty: 3

## 2015-12-15 MED FILL — BD POSIFLUSH NORMAL SALINE 0.9 % INJECTION SYRINGE: INTRAMUSCULAR | Qty: 10

## 2015-12-15 NOTE — ED Provider Notes (Signed)
HPI Comments: Sara Hopkins is a 65 y.o. female, with PMHx significant for DM, HTN, and asthma, who presents ambulatory to the ED with cc of sudden onset wheezing since 0200 this morning, while at home. Pt also reports additional symptoms of SOB and non-productive cough. Of note, pt states she is not taking medication for DM or HTN, since she reports "no longer having DM or HTN". Pt specifically denies CP, nausea, vomiting, or fever. Pt also denies cardiac history.      PSHx of shoulder arthroscopy     Social hx: - Tobacco use, - EtOH use, - Illicit drug use    PCP: PROVIDER UNKNOWN    There are no other complaints, changes or physical findings at this time.     The history is provided by the patient. No language interpreter was used.        Past Medical History:   Diagnosis Date   ??? Asthma    ??? Diabetes (HCC)    ??? Hypertension    ??? Screen for colon cancer 08/15/2014       Past Surgical History:   Procedure Laterality Date   ??? HX SHOULDER ARTHROSCOPY           History reviewed. No pertinent family history.    Social History     Social History   ??? Marital status: SINGLE     Spouse name: N/A   ??? Number of children: N/A   ??? Years of education: N/A     Occupational History   ??? Not on file.     Social History Main Topics   ??? Smoking status: Never Smoker   ??? Smokeless tobacco: Never Used   ??? Alcohol use No   ??? Drug use: No   ??? Sexual activity: Not on file     Other Topics Concern   ??? Not on file     Social History Narrative         ALLERGIES: Latex; Iodine; Penicillins; Salicylates; and Shellfish derived    Review of Systems   Constitutional: Negative for fever.   HENT: Negative for sore throat.    Eyes: Negative for photophobia and redness.   Respiratory: Positive for cough (non-productive ), shortness of breath and wheezing.    Cardiovascular: Negative for chest pain and leg swelling.   Gastrointestinal: Negative for abdominal pain, blood in stool, nausea and vomiting.    Genitourinary: Negative for difficulty urinating, dysuria, hematuria, menstrual problem and vaginal bleeding.   Musculoskeletal: Negative for back pain and joint swelling.   Neurological: Negative for dizziness, seizures, syncope, speech difficulty, weakness, numbness and headaches.   Hematological: Negative for adenopathy.   Psychiatric/Behavioral: Negative for agitation, confusion and suicidal ideas. The patient is not nervous/anxious.        Vitals:    12/15/15 0902 12/15/15 1008   BP: (!) 219/114 (!) 162/94   Pulse: (!) 116 (!) 111   Resp: 30 18   Temp: 97.9 ??F (36.6 ??C)    SpO2: 95% 97%   Weight: 74.8 kg (165 lb)    Height: 5\' 3"  (1.6 m)             Physical Exam   Constitutional: She is oriented to person, place, and time. She appears well-developed and well-nourished. No distress.   HENT:   Head: Normocephalic and atraumatic.   Mouth/Throat: Oropharynx is clear and moist. No oropharyngeal exudate.   Eyes: Conjunctivae and EOM are normal. Pupils are equal, round, and reactive to  light. Left eye exhibits no discharge.   Neck: Normal range of motion. Neck supple. No JVD present.   Cardiovascular: Regular rhythm, normal heart sounds and intact distal pulses.  Tachycardia present.    Pulmonary/Chest: Effort normal. No respiratory distress.   b/l scattererd wheezes with bronchi   Abdominal: Soft. Bowel sounds are normal. She exhibits no distension. There is no tenderness. There is no rebound and no guarding.   Musculoskeletal: Normal range of motion. She exhibits no edema or tenderness.   Lymphadenopathy:     She has no cervical adenopathy.   Neurological: She is alert and oriented to person, place, and time. She has normal reflexes. No cranial nerve deficit.   Speaking in full sentences   Skin: Skin is warm and dry. No rash noted.   Psychiatric: She has a normal mood and affect. Her behavior is normal.   Nursing note and vitals reviewed.       MDM  Number of Diagnoses or Management Options   Diagnosis management comments: DDx: asthma exacerbation, COPD exacerbation, PNA, CHF, hypertensive crisis        Amount and/or Complexity of Data Reviewed  Clinical lab tests: ordered and reviewed  Tests in the radiology section of CPT??: ordered and reviewed  Tests in the medicine section of CPT??: ordered and reviewed  Review and summarize past medical records: yes  Independent visualization of images, tracings, or specimens: yes    Patient Progress  Patient progress: stable    ED Course       Procedures    EKG interpretation: (Preliminary)  9:13 AM  Rhythm: sinus tachycardia; and regular . Rate (approx.): 113; Axis: normal; PR interval: normal; QRS interval: normal ; ST/T wave: normal; No acute changes      PROGRESS NOTE:  10:07 AM  Pt has been re-evaluated. Pt's BP has decreased.     PROGRESS NOTE:  11:10 AM  Pt has been re-evaluated. Pt was instructed to increase her dosage of Metformin to 1000 mg twice a day.     PROGRESS NOTE:  11:12 AM  Updated and informed pt with plan of discharge.    LABORATORY TESTS:  Recent Results (from the past 12 hour(s))   CBC WITH AUTOMATED DIFF    Collection Time: 12/15/15  9:13 AM   Result Value Ref Range    WBC 11.5 (H) 3.6 - 11.0 K/uL    RBC 5.54 (H) 3.80 - 5.20 M/uL    HGB 16.8 (H) 11.5 - 16.0 g/dL    HCT 16.1 (H) 09.6 - 47.0 %    MCV 88.4 80.0 - 99.0 FL    MCH 30.3 26.0 - 34.0 PG    MCHC 34.3 30.0 - 36.5 g/dL    RDW 04.5 40.9 - 81.1 %    PLATELET 155 150 - 400 K/uL    NEUTROPHILS 85 (H) 32 - 75 %    LYMPHOCYTES 8 (L) 12 - 49 %    MONOCYTES 6 5 - 13 %    EOSINOPHILS 1 0 - 7 %    BASOPHILS 0 0 - 1 %    ABS. NEUTROPHILS 9.8 (H) 1.8 - 8.0 K/UL    ABS. LYMPHOCYTES 1.0 0.8 - 3.5 K/UL    ABS. MONOCYTES 0.7 0.0 - 1.0 K/UL    ABS. EOSINOPHILS 0.1 0.0 - 0.4 K/UL    ABS. BASOPHILS 0.0 0.0 - 0.1 K/UL   METABOLIC PANEL, BASIC    Collection Time: 12/15/15  9:13 AM   Result Value Ref  Range    Sodium 133 (L) 136 - 145 mmol/L    Potassium 4.1 3.5 - 5.1 mmol/L     Chloride 93 (L) 97 - 108 mmol/L    CO2 27 21 - 32 mmol/L    Anion gap 13 5 - 15 mmol/L    Glucose 542 (HH) 65 - 100 mg/dL    BUN 9 6 - 20 MG/DL    Creatinine 1.61 (H) 0.55 - 1.02 MG/DL    BUN/Creatinine ratio 9 (L) 12 - 20      GFR est AA >60 >60 ml/min/1.23m2    GFR est non-AA 52 (L) >60 ml/min/1.18m2    Calcium 10.1 8.5 - 10.1 MG/DL   NT-PRO BNP    Collection Time: 12/15/15  9:13 AM   Result Value Ref Range    NT pro-BNP 158 (H) 0 - 125 PG/ML   TROPONIN I    Collection Time: 12/15/15  9:13 AM   Result Value Ref Range    Troponin-I, Qt. <0.04 <0.05 ng/mL   EKG, 12 LEAD, INITIAL    Collection Time: 12/15/15  9:13 AM   Result Value Ref Range    Ventricular Rate 113 BPM    Atrial Rate 113 BPM    P-R Interval 150 ms    QRS Duration 84 ms    Q-T Interval 326 ms    QTC Calculation (Bezet) 447 ms    Calculated P Axis 71 degrees    Calculated R Axis 72 degrees    Calculated T Axis -40 degrees    Diagnosis       Sinus tachycardia  Minimal voltage criteria for LVH, may be normal variant  T wave abnormality, consider inferior ischemia  Abnormal ECG  When compared with ECG of 23-Jan-2007 00:23,  ST now depressed in Anterior leads  Inverted T waves have replaced nonspecific T wave abnormality in Inferior   leads  T wave amplitude has increased in Anterior leads     GLUCOSE, POC    Collection Time: 12/15/15 10:53 AM   Result Value Ref Range    Glucose (POC) 563 (H) 65 - 100 mg/dL    Performed by Purcell Nails (PCT)    GLUCOSE, POC    Collection Time: 12/15/15 10:55 AM   Result Value Ref Range    Glucose (POC) 515 (H) 65 - 100 mg/dL    Performed by Purcell Nails (PCT)        IMAGING RESULTS:    CXR Results  (Last 48 hours)               12/15/15 0956  XR CHEST PA LAT Final result    Impression:  IMPRESSION:       No acute process. Stable exam.           Narrative:  EXAM:  XR CHEST PA LAT       INDICATION:  Wheezing and shortness of breath.       COMPARISON: 01/22/2007       TECHNIQUE: PA and lateral chest views        FINDINGS: The cardiomediastinal contours are stable. The pulmonary vasculature   is within normal limits.        The lungs and pleural spaces are clear. There is no pneumothorax. The bones and   upper abdomen are stable.                  MEDICATIONS GIVEN:  Medications   sodium chloride (NS) flush 5-10 mL (10  mL IntraVENous Given 12/15/15 1020)   sodium chloride (NS) flush 5-10 mL (not administered)   methylPREDNISolone (PF) (SOLU-MEDROL) injection 125 mg (125 mg IntraVENous Refused 12/15/15 1008)   0.9% sodium chloride infusion (150 mL/hr IntraVENous New Bag 12/15/15 0925)   sodium chloride 0.9 % bolus infusion 500 mL (500 mL IntraVENous New Bag 12/15/15 1019)   sodium chloride 0.9 % bolus infusion 1,000 mL (1,000 mL IntraVENous New Bag 12/15/15 1110)   albuterol-ipratropium (DUO-NEB) 2.5 MG-0.5 MG/3 ML (3 mL Nebulization Given 12/15/15 0915)   albuterol-ipratropium (DUO-NEB) 2.5 MG-0.5 MG/3 ML (3 mL Nebulization Given 12/15/15 1020)   insulin regular (NOVOLIN R, HUMULIN R) injection 10 Units (10 Units IntraVENous Given 12/15/15 1019)   insulin regular (NOVOLIN R, HUMULIN R) injection 10 Units (10 Units IntraVENous Given 12/15/15 1108)       IMPRESSION:  1. Mild persistent asthma with acute exacerbation    2. Hyperglycemia        PLAN:  1.   Current Discharge Medication List        2.   Follow-up Information     Follow up With Details Comments Contact Info    [facility] In 1 week  [address]  [place] [id number]  [phone]        Return to ED if worse     DISCHARGE NOTE  11:11 AM  The patient has been re-evaluated and is ready for discharge. Reviewed available results with patient. Counseled patient on diagnosis and care plan. Patient has expressed understanding, and all questions have been answered. Patient agrees with plan and agrees to follow up as recommended, or return to the ED if their symptoms worsen. Discharge instructions have  been provided and explained to the patient, along with reasons to return to the ED.    ATTESTATION:  This note is prepared by Addison NaegeliJulian Radford, acting as Scribe for Belva CromePedro Lanett Lasorsa, MD.    Belva CromePedro Arda Keadle, MD: The scribe's documentation has been prepared under my direction and personally reviewed by me in its entirety. I confirm that the note above accurately reflects all work, treatment, procedures, and medical decision making performed by me.

## 2015-12-15 NOTE — ED Notes (Signed)
Patient coughing, states sputum is yellow-green now instead of the clear it was before.

## 2015-12-15 NOTE — Progress Notes (Signed)
ACO patient. Chart reviewed. CM verified patient's demographics and phone number. No PCP. Patient stated that she does see Dr. Lewayne BuntingGregory Cook for her Diabetes.  Cm spoke with patient regarding obtain an PCP. Patient has consent for cm to schedule an appointment at [facility].      Lynnae PrudeAugustina Nyeche NP  On 12/22/15 at 12 noon.

## 2015-12-15 NOTE — ED Notes (Signed)
Patient (s)  given copy of dc instructions and 0 paper script(s) and 4 electronic scripts.  Patient (s)  verbalized understanding of instructions and script (s).  Patient given a current medication reconciliation form and verbalized understanding of their medications.   Patient (s) verbalized understanding of the importance of discussing medications with  his or her physician or clinic they will be following up with.  Patient alert and oriented and in no acute distress.  Patient offered wheelchair from treatment area to hospital entrance, patient declines wheelchair. Patient asking to see case manager again before she leaves.

## 2015-12-15 NOTE — ED Notes (Signed)
Nebulizer started now.

## 2015-12-15 NOTE — ED Notes (Signed)
Patient here with c/o wheezing and SOB.  Patient states that [place] recently traveled down [place] and when she came back her breathing became difficult due to the weather changes.  Patient reports mostly nonproductive cough, scant clear sputum.  Denies fever.          Emergency Department Nursing Plan of Care       The Nursing Plan of Care is developed from the Nursing assessment and Emergency Department Attending provider initial evaluation.  The plan of care may be reviewed in the ???ED Provider note???.    The Plan of Care was developed with the following considerations:   Patient / Family readiness to learn indicated ZO:[id number]:verbalized understanding  Persons(s) to be included in education: patient  Barriers to Learning/Limitations:No    Signed     Darnelle Catalanerial L White, RN    12/15/2015   9:34 AM

## 2016-05-13 MED ORDER — LISINOPRIL 40 MG TAB
40 mg | ORAL_TABLET | Freq: Every day | ORAL | 3 refills | Status: DC
Start: 2016-05-13 — End: 2016-05-13

## 2016-05-13 MED ORDER — AMLODIPINE 5 MG TAB
5 mg | ORAL_TABLET | Freq: Every day | ORAL | 3 refills | Status: AC
Start: 2016-05-13 — End: ?

## 2016-05-13 MED ORDER — AMLODIPINE 5 MG TAB
5 mg | ORAL_TABLET | Freq: Every day | ORAL | 3 refills | Status: DC
Start: 2016-05-13 — End: 2016-05-13

## 2016-05-13 MED ORDER — LISINOPRIL 40 MG TAB
40 mg | ORAL_TABLET | Freq: Every day | ORAL | 3 refills | Status: DC
Start: 2016-05-13 — End: 2016-09-04

## 2016-05-13 NOTE — Telephone Encounter (Signed)
-----   Message from Modena Slaterobin A Bailey sent at 05/13/2016  1:45 PM EST -----  Regarding: Dr. Jeannetta Napook/Refill  Dee, [facility], requesting refills on amlodipine and lisinopril. In addition, pt [facility] to [facility] and pt is completely out of meds.      [facility]:   [phone] ([phone])  [phone])

## 2016-05-13 NOTE — Telephone Encounter (Signed)
Left voicemail at [facility] requesting to cancel prescriptions for lisinopril and amlodipine.

## 2016-05-13 NOTE — Telephone Encounter (Signed)
Message is from answering services ([employer])    Thanks [name].

## 2016-05-13 NOTE — Telephone Encounter (Signed)
Carlena SaxBlair,  I sent amlodipine and lisinopril to local [facility]. Please cancel this one    [facility] then [facility] [place] as she requested.  Thank you

## 2016-05-13 NOTE — Telephone Encounter (Signed)
Patient is requesting refills of lisinopril and [facility]lodipine [facility] [address] in [place]. Please clarify dose of both medications because they are not on her medication list.

## 2016-08-09 NOTE — Telephone Encounter (Signed)
Message is from answering services ([employer])    Thanks [name].

## 2016-08-09 NOTE — Telephone Encounter (Signed)
Hi Blair! Ms. Sara Hopkins called back and she says that her understanding of her bills are taken care of and she is now asking If Dr. Adriana Simas can write a new prescription for her blood pressure medication because she says she cant take (Lisinopril) anymore. Sara Hopkins would like for you to send her prescription to [facility] [address] [place] [facility] [phone].        Thanks Casimiro Needle

## 2016-08-09 NOTE — Telephone Encounter (Signed)
Hi Sara Hopkins! I spoke with Sara Hopkins and I Informed her that she would have to speak with our collection agency about any bill that is past due. I gave her that information and explained that she can still be seen by Dr. Adriana Simas but would have to deal with [facility] separately form RDE. Sara Hopkins says she will call back to see if she will schedule with Dr. Adriana Simas.    Thanks Casimiro Needle.

## 2016-08-09 NOTE — Telephone Encounter (Signed)
Returned call from patient. Acknowledged patient's concerns regarding her bill.  Per patient, bill is in collections because of an error from our office. Patient was not satisfied that I could not give her information regarding her bill, or that I had not researched it prior to returning her all. Told her I would have the appropriate person call her to discuss her bill. She would also like to scheduled a follow up visit.   She wished to discuss [facility] bill as well, but told her we are separate company than [facility] and do not have access to their billing or records.   Patient stated she was seen in the ED recently for facial and tongue swelling and was advised to stop lisinopril. Per patient, ED provider told her she should never take a medication longer than 7 years or she could become allergic to it. Patient stated Dr. Adriana Simas should have discussed this with her. I told patient I have never heard this before, but would make Dr. Adriana Simas aware of her concerns.   Patient requested return call from Dr. Adriana Simas. Explained to patient this would best be addressed at an office visit, but she declined to schedule due to outstanding bill. She again requested return call from Dr. Adriana Simas.

## 2016-08-09 NOTE — Telephone Encounter (Signed)
-----   Message from Hyman Hopes sent at 08/09/2016  8:27 AM EDT -----  Regarding: Dr. Luellen Pucker  The patient is requesting a call back from the nurse to discuss her appointment and bill. [phone]

## 2016-08-14 NOTE — Telephone Encounter (Signed)
Sara Hopkins,  Please tell her I am sorry to hear about the tongue and facial swelling and about her outstanding bills (but I do not know details about these problems)    I would prefer not to start a new medication as it appears she may not follow-up with me for some time and it has been 9 months since her last visit. .    I disagree with what she was told about how one becomes allergic to a medication after taking it for > 7 years.  I have never heard or read anything that would support this belief.    Is she following with someone in the [place] area now?

## 2016-09-03 NOTE — Telephone Encounter (Signed)
-----   Message from Tilda Franco sent at 09/03/2016  1:28 PM EDT -----  Regarding: Patient Call  Contact: [phone]  09/03/2016  1:29 PM    Good Afternoon Dr. Adriana Simas,    Ms. Ganas called she has some concern about her blood pressure medication lisinopril that she stated has caused her to have allergic reactions.  She has spoken to Emmit Pomfret, Casimiro Needle and myself and wanted to know if you could call her.  We offered her an appointment but she didn't want to make one.    She can be reached at [phone].    Thanks  [employer]

## 2016-09-03 NOTE — Telephone Encounter (Signed)
Attempted to call her. She did not answer and 'voice mailbox had not been set up' , so unable to leave a message.

## 2016-09-04 ENCOUNTER — Telehealth

## 2016-09-04 NOTE — Telephone Encounter (Signed)
Describes having angioedema with lisinopril. Seen in ED in [place]    Now taking 'aged garlic'.  BP is well controlled with this and amlodipine - reports BP was 124/74 recently.     Recommended she continue this.     Her mother passed away recently.    Also reports having congestion from allergies of late.     She reports she would like to be re-scheduled for an office visit.   Reports her [facility] bill has been cleared up    She would like michael to call her tomorrow to schedule follow-up. She would consider cancellation list.

## 2016-09-04 NOTE — Telephone Encounter (Addendum)
Good Afternoon Renata,    FYI please see note below.    Thanks  Steph    ----- Message from Myrna Blazer, MD sent at 09/04/2016  4:22 PM EDT -----  Regarding: RE: Dr Cook/rx refill  I called her yesterday - see telephone encounter. I went to voicemail, which had not been set up.   I tried calling her around 6 pm yesterday    ----- Message -----     From: Maia Plan     Sent: 09/04/2016   8:16 AM       To: Earlyne Iba, MD  Subject: FW: Dr Cook/rx refill                            09/04/2016  8:17 AM      Please see message below from answering service.        Thanks   ----- Message -----     From: Elease Etienne     Sent: 09/03/2016   5:25 PM       To: [facility] Office Pool  Subject: Dr Cook/rx refill                                Pt (p) [phone], pt would like a return call back from Dr Adriana Simas regarding her not getting her rx refilled for her BP medication she have been waiting for the last 2 months, since she had an allergic reaction to the last bp medication that was prescribed.     She was told someone was going to call her back today.

## 2016-09-04 NOTE — Telephone Encounter (Signed)
-----   Message from Maia Planenata A Lucas sent at 09/04/2016  8:16 AM EDT -----  Regarding: FW: Dr Cook/rx refill  09/04/2016  8:17 AM      Please see message below from answering service.        Thanks   ----- Message -----     From: Elease EtienneKaren I [facility]     Sent: 09/03/2016   5:25 PM       To: [facility] Office Pool  Subject: Dr Cook/rx refill                                Pt (p) [phone], pt would like a return call back from Dr Adriana Simasook regarding her not getting her rx refilled for her BP medication she have been waiting for the last 2 months, since she had an allergic reaction to the last bp medication that was prescribed.     She was told someone was going to call her back today.

## 2016-09-05 NOTE — Telephone Encounter (Signed)
Please advise if a1c, cmp, lipid panel, hep C ab, microalbumin are needed.

## 2016-09-05 NOTE — Telephone Encounter (Signed)
Hi Dr. Adriana Simasook! I spoke with Ms. Sara Hopkins and she would like new lab orders so she can get her blood work done and then she will call back to schedule an appointment for a follow up visit.      Thanks [name].

## 2016-09-05 NOTE — Telephone Encounter (Signed)
Lab order ready.

## 2016-09-05 NOTE — Telephone Encounter (Signed)
Yes. Thank you.  Hep C: dx need for hepatitis C screening

## 2016-09-05 NOTE — Telephone Encounter (Signed)
09/05/2016  11:45 AM        Ms. Emmick need a lab slip ASAP, when ready I will call.          Thanks

## 2016-09-12 LAB — METABOLIC PANEL, COMPREHENSIVE
A-G Ratio: 1.6 (ref 1.2–2.2)
ALT (SGPT): 18 IU/L (ref 0–32)
AST (SGOT): 16 IU/L (ref 0–40)
Albumin: 4.5 g/dL (ref 3.6–4.8)
Alk. phosphatase: 64 IU/L (ref 39–117)
BUN/Creatinine ratio: 17 (ref 12–28)
BUN: 12 mg/dL (ref 8–27)
Bilirubin, total: 0.4 mg/dL (ref 0.0–1.2)
CO2: 29 mmol/L (ref 18–29)
Calcium: 10.1 mg/dL (ref 8.7–10.3)
Chloride: 96 mmol/L (ref 96–106)
Creatinine: 0.7 mg/dL (ref 0.57–1.00)
GFR est AA: 104 mL/min/{1.73_m2} (ref >59)
GFR est non-AA: 90 mL/min/{1.73_m2} (ref >59)
GLOBULIN, TOTAL: 2.8 g/dL (ref 1.5–4.5)
Glucose: 180 mg/dL — ABNORMAL HIGH (ref 65–99)
Potassium: 4 mmol/L (ref 3.5–5.2)
Protein, total: 7.3 g/dL (ref 6.0–8.5)
Sodium: 140 mmol/L (ref 134–144)

## 2016-09-12 LAB — CVD REPORT

## 2016-09-12 LAB — LIPID PANEL
Cholesterol, total: 146 mg/dL (ref 100–199)
HDL Cholesterol: 49 mg/dL (ref >39)
LDL, calculated: 72 mg/dL (ref 0–99)
Triglyceride: 127 mg/dL (ref 0–149)
VLDL, calculated: 25 mg/dL (ref 5–40)

## 2016-09-12 LAB — MICROALBUMIN, UR, RAND W/ MICROALB/CREAT RATIO
Creatinine, urine random: 47.7 mg/dL
Microalb/Creat ratio (ug/mg creat.): 68.3 mg/g creat — ABNORMAL HIGH (ref 0.0–30.0)
Microalbumin, urine: 32.6 ug/mL

## 2016-09-12 LAB — DIABETES PATIENT EDUCATION

## 2016-09-12 LAB — HEMOGLOBIN A1C WITH EAG
Estimated average glucose: 209 mg/dL
Hemoglobin A1c: 8.9 % — ABNORMAL HIGH (ref 4.8–5.6)

## 2016-09-12 LAB — HEPATITIS C AB: Hep C Virus Ab: <0.1 s/co ratio (ref 0.0–0.9)

## 2016-09-12 LAB — HEPATITIS C ANTIBODY: HCV Ab: <0.1 s/co ratio (ref 0.0–0.9)

## 2017-07-26 LAB — HM MAMMOGRAPHY

## 2017-10-30 DIAGNOSIS — I712 Thoracic aortic aneurysm, without rupture, unspecified: Secondary | ICD-10-CM

## 2017-10-30 HISTORY — DX: Thoracic aortic aneurysm, without rupture: I71.2

## 2017-10-30 HISTORY — DX: Thoracic aortic aneurysm, without rupture, unspecified: I71.20

## 2017-10-31 DIAGNOSIS — I7123 Aneurysm of the descending thoracic aorta, without rupture: Secondary | ICD-10-CM

## 2017-10-31 DIAGNOSIS — I71012 Dissection of descending thoracic aorta: Secondary | ICD-10-CM

## 2017-10-31 DIAGNOSIS — I712 Thoracic aortic aneurysm, without rupture: Secondary | ICD-10-CM

## 2017-10-31 DIAGNOSIS — I739 Peripheral vascular disease, unspecified: Secondary | ICD-10-CM | POA: Insufficient documentation

## 2017-10-31 DIAGNOSIS — I7101 Dissection of thoracic aorta: Secondary | ICD-10-CM | POA: Insufficient documentation

## 2017-10-31 HISTORY — DX: Thoracic aortic aneurysm, without rupture: I71.2

## 2017-10-31 HISTORY — DX: Dissection of descending thoracic aorta: I71.012

## 2017-10-31 HISTORY — DX: Aneurysm of the descending thoracic aorta, without rupture: I71.23

## 2017-11-01 DIAGNOSIS — J90 Pleural effusion, not elsewhere classified: Secondary | ICD-10-CM

## 2017-11-01 HISTORY — DX: Pleural effusion, not elsewhere classified: J90

## 2017-11-03 DIAGNOSIS — G8918 Other acute postprocedural pain: Secondary | ICD-10-CM | POA: Insufficient documentation

## 2017-11-03 HISTORY — DX: Other acute postprocedural pain: G89.18

## 2017-11-04 DIAGNOSIS — Z7409 Other reduced mobility: Secondary | ICD-10-CM

## 2017-11-04 DIAGNOSIS — G4700 Insomnia, unspecified: Secondary | ICD-10-CM

## 2017-11-04 DIAGNOSIS — R531 Weakness: Secondary | ICD-10-CM

## 2017-11-04 DIAGNOSIS — R6889 Other general symptoms and signs: Secondary | ICD-10-CM | POA: Insufficient documentation

## 2017-11-04 HISTORY — DX: Weakness: R53.1

## 2017-11-04 HISTORY — DX: Other general symptoms and signs: R68.89

## 2017-11-04 HISTORY — DX: Insomnia, unspecified: G47.00

## 2017-11-04 HISTORY — DX: Weakness: Z74.09

## 2017-11-06 DIAGNOSIS — I517 Cardiomegaly: Secondary | ICD-10-CM

## 2017-11-06 DIAGNOSIS — Z789 Other specified health status: Secondary | ICD-10-CM

## 2017-11-06 DIAGNOSIS — D62 Acute posthemorrhagic anemia: Secondary | ICD-10-CM | POA: Insufficient documentation

## 2017-11-06 HISTORY — DX: Cardiomegaly: I51.7

## 2017-11-06 HISTORY — DX: Acute posthemorrhagic anemia: D62

## 2017-11-06 HISTORY — DX: Other specified health status: Z78.9

## 2017-11-28 DIAGNOSIS — Z95828 Presence of other vascular implants and grafts: Secondary | ICD-10-CM

## 2017-11-28 HISTORY — DX: Presence of other vascular implants and grafts: Z95.828

## 2018-01-20 DIAGNOSIS — R339 Retention of urine, unspecified: Secondary | ICD-10-CM

## 2018-01-20 DIAGNOSIS — N3281 Overactive bladder: Secondary | ICD-10-CM

## 2018-01-20 HISTORY — DX: Retention of urine, unspecified: R33.9

## 2018-01-20 HISTORY — DX: Overactive bladder: N32.81

## 2018-07-13 DIAGNOSIS — S8255XA Nondisplaced fracture of medial malleolus of left tibia, initial encounter for closed fracture: Secondary | ICD-10-CM | POA: Insufficient documentation

## 2018-07-21 DIAGNOSIS — S82842A Displaced bimalleolar fracture of left lower leg, initial encounter for closed fracture: Secondary | ICD-10-CM | POA: Insufficient documentation

## 2018-10-04 ENCOUNTER — Inpatient Hospital Stay
Admission: EM | Admit: 2018-10-04 | Discharge: 2018-10-12 | DRG: 757 | Disposition: A | Discharge status: Home / Self Care | Payer: Medicare Other | Attending: Internal Medicine | Admitting: Internal Medicine

## 2018-10-04 ENCOUNTER — Other Ambulatory Visit: Payer: Self-pay

## 2018-10-04 DIAGNOSIS — I10 Essential (primary) hypertension: Secondary | ICD-10-CM | POA: Diagnosis present

## 2018-10-04 DIAGNOSIS — Z91041 Radiographic dye allergy status: Secondary | ICD-10-CM

## 2018-10-04 DIAGNOSIS — N739 Female pelvic inflammatory disease, unspecified: Secondary | ICD-10-CM | POA: Diagnosis present

## 2018-10-04 DIAGNOSIS — E876 Hypokalemia: Secondary | ICD-10-CM | POA: Diagnosis present

## 2018-10-04 DIAGNOSIS — N390 Urinary tract infection, site not specified: Secondary | ICD-10-CM | POA: Diagnosis present

## 2018-10-04 DIAGNOSIS — Z91040 Latex allergy status: Secondary | ICD-10-CM

## 2018-10-04 DIAGNOSIS — E1110 Type 2 diabetes mellitus with ketoacidosis without coma: Secondary | ICD-10-CM

## 2018-10-04 DIAGNOSIS — Z1159 Encounter for screening for other viral diseases: Secondary | ICD-10-CM | POA: Diagnosis not present

## 2018-10-04 DIAGNOSIS — B9620 Unspecified Escherichia coli [E. coli] as the cause of diseases classified elsewhere: Secondary | ICD-10-CM | POA: Diagnosis present

## 2018-10-04 DIAGNOSIS — Z87440 Personal history of urinary (tract) infections: Secondary | ICD-10-CM

## 2018-10-04 DIAGNOSIS — E1151 Type 2 diabetes mellitus with diabetic peripheral angiopathy without gangrene: Secondary | ICD-10-CM | POA: Diagnosis present

## 2018-10-04 DIAGNOSIS — R339 Retention of urine, unspecified: Secondary | ICD-10-CM | POA: Diagnosis present

## 2018-10-04 DIAGNOSIS — Z888 Allergy status to other drugs, medicaments and biological substances status: Secondary | ICD-10-CM

## 2018-10-04 DIAGNOSIS — E860 Dehydration: Secondary | ICD-10-CM | POA: Diagnosis present

## 2018-10-04 DIAGNOSIS — N719 Inflammatory disease of uterus, unspecified: Secondary | ICD-10-CM

## 2018-10-04 DIAGNOSIS — T380X5A Adverse effect of glucocorticoids and synthetic analogues, initial encounter: Secondary | ICD-10-CM | POA: Diagnosis present

## 2018-10-04 DIAGNOSIS — I152 Hypertension secondary to endocrine disorders: Secondary | ICD-10-CM | POA: Diagnosis present

## 2018-10-04 DIAGNOSIS — Z880 Allergy status to penicillin: Secondary | ICD-10-CM

## 2018-10-04 DIAGNOSIS — E1140 Type 2 diabetes mellitus with diabetic neuropathy, unspecified: Secondary | ICD-10-CM | POA: Diagnosis present

## 2018-10-04 DIAGNOSIS — N7093 Salpingitis and oophoritis, unspecified: Secondary | ICD-10-CM | POA: Diagnosis present

## 2018-10-04 DIAGNOSIS — Z7982 Long term (current) use of aspirin: Secondary | ICD-10-CM

## 2018-10-04 DIAGNOSIS — Z7984 Long term (current) use of oral hypoglycemic drugs: Secondary | ICD-10-CM

## 2018-10-04 DIAGNOSIS — E871 Hypo-osmolality and hyponatremia: Secondary | ICD-10-CM | POA: Diagnosis present

## 2018-10-04 DIAGNOSIS — J45909 Unspecified asthma, uncomplicated: Secondary | ICD-10-CM | POA: Diagnosis present

## 2018-10-04 DIAGNOSIS — E1159 Type 2 diabetes mellitus with other circulatory complications: Secondary | ICD-10-CM | POA: Diagnosis present

## 2018-10-04 DIAGNOSIS — R109 Unspecified abdominal pain: Secondary | ICD-10-CM | POA: Diagnosis not present

## 2018-10-04 DIAGNOSIS — Z91013 Allergy to seafood: Secondary | ICD-10-CM

## 2018-10-04 DIAGNOSIS — Z79899 Other long term (current) drug therapy: Secondary | ICD-10-CM

## 2018-10-04 HISTORY — DX: Type 2 diabetes mellitus without complications: E11.9

## 2018-10-04 HISTORY — DX: Polyneuropathy, unspecified: G62.9

## 2018-10-04 HISTORY — DX: Unspecified asthma, uncomplicated: J45.909

## 2018-10-04 HISTORY — DX: Peripheral vascular disease, unspecified: I73.9

## 2018-10-04 HISTORY — DX: Essential (primary) hypertension: I10

## 2018-10-04 HISTORY — DX: Type 2 diabetes mellitus with ketoacidosis without coma: E11.10

## 2018-10-04 LAB — URINALYSIS, COMPLETE (UACMP) WITH MICROSCOPIC
Bilirubin Urine: NEGATIVE
Glucose, UA: >=500 mg/dL — AB
Hgb urine dipstick: NEGATIVE
Ketones, ur: 80 mg/dL — AB
Leukocytes,Ua: NEGATIVE
Nitrite: POSITIVE — AB
Protein, ur: 100 mg/dL — AB
Specific Gravity, Urine: 1.028 (ref 1.005–1.030)
Squamous Epithelial / LPF: NONE SEEN (ref 0–5)
pH: 5 (ref 5.0–8.0)

## 2018-10-04 LAB — CBC WITH DIFFERENTIAL/PLATELET
Abs Immature Granulocytes: 0.03 10*3/uL (ref 0.00–0.07)
Basophils Absolute: 0 10*3/uL (ref 0.0–0.1)
Basophils Relative: 0 %
Eosinophils Absolute: 0 10*3/uL (ref 0.0–0.5)
Eosinophils Relative: 0 %
HCT: 45.2 % (ref 36.0–46.0)
Hemoglobin: 14.5 g/dL (ref 12.0–15.0)
Immature Granulocytes: 0 %
Lymphocytes Relative: 15 %
Lymphs Abs: 1.3 10*3/uL (ref 0.7–4.0)
MCH: 29.5 pg (ref 26.0–34.0)
MCHC: 32.1 g/dL (ref 30.0–36.0)
MCV: 92.1 fL (ref 80.0–100.0)
Monocytes Absolute: 0.7 10*3/uL (ref 0.1–1.0)
Monocytes Relative: 9 %
Neutro Abs: 6.3 10*3/uL (ref 1.7–7.7)
Neutrophils Relative %: 76 %
Platelets: 231 10*3/uL (ref 150–400)
RBC: 4.91 MIL/uL (ref 3.87–5.11)
RDW: 13.8 % (ref 11.5–15.5)
WBC: 8.4 10*3/uL (ref 4.0–10.5)
nRBC: 0 % (ref 0.0–0.2)

## 2018-10-04 LAB — BASIC METABOLIC PANEL
Anion gap: 18 — ABNORMAL HIGH (ref 5–15)
Anion gap: 14 (ref 5–15)
BUN: 12 mg/dL (ref 8–23)
BUN: 11 mg/dL (ref 8–23)
CO2: 21 mmol/L — ABNORMAL LOW (ref 22–32)
CO2: 19 mmol/L — ABNORMAL LOW (ref 22–32)
Calcium: 9.4 mg/dL (ref 8.9–10.3)
Calcium: 8.7 mg/dL — ABNORMAL LOW (ref 8.9–10.3)
Chloride: 94 mmol/L — ABNORMAL LOW (ref 98–111)
Chloride: 102 mmol/L (ref 98–111)
Creatinine, Ser: 0.77 mg/dL (ref 0.44–1.00)
Creatinine, Ser: 0.57 mg/dL (ref 0.44–1.00)
GFR calc Af Amer: >60 mL/min (ref >60)
GFR calc Af Amer: >60 mL/min (ref >60)
GFR calc non Af Amer: >60 mL/min (ref >60)
GFR calc non Af Amer: >60 mL/min (ref >60)
Glucose, Bld: 416 mg/dL — ABNORMAL HIGH (ref 70–99)
Glucose, Bld: 313 mg/dL — ABNORMAL HIGH (ref 70–99)
Potassium: 3.4 mmol/L — ABNORMAL LOW (ref 3.5–5.1)
Potassium: 3.4 mmol/L — ABNORMAL LOW (ref 3.5–5.1)
Sodium: 133 mmol/L — ABNORMAL LOW (ref 135–145)
Sodium: 135 mmol/L (ref 135–145)

## 2018-10-04 LAB — GLUCOSE, CAPILLARY: Glucose-Capillary: 286 mg/dL — ABNORMAL HIGH (ref 70–99)

## 2018-10-04 LAB — SARS CORONAVIRUS 2 BY RT PCR (HOSPITAL ORDER, PERFORMED IN ~~LOC~~ HOSPITAL LAB): SARS Coronavirus 2: NEGATIVE

## 2018-10-04 MED ORDER — SODIUM CHLORIDE 0.9 % IV SOLN
1.0000 g | Freq: Once | INTRAVENOUS | Status: AC
  Administered 2018-10-04: 1 g via INTRAVENOUS
  Filled 2018-10-04: qty 10

## 2018-10-04 MED ORDER — CEPHALEXIN 500 MG PO CAPS: 500.0000 mg | ORAL_CAPSULE | Freq: Three times a day (TID) | ORAL | 0 refills | Status: DC

## 2018-10-04 MED ORDER — SODIUM CHLORIDE 0.9 % IV BOLUS
500.0000 mL | Freq: Once | INTRAVENOUS | Status: AC
  Administered 2018-10-04: 20:00:00 500 mL via INTRAVENOUS

## 2018-10-04 MED ORDER — INSULIN ASPART 100 UNIT/ML ~~LOC~~ SOLN
6.0000 [IU] | Freq: Once | SUBCUTANEOUS | Status: AC
  Administered 2018-10-04: 6 [IU] via INTRAVENOUS
  Filled 2018-10-04: qty 1

## 2018-10-04 NOTE — ED Notes (Signed)
Bladder scan revealed 200mL. 

## 2018-10-04 NOTE — ED Provider Notes (Addendum)
[facility] Emergency Department Provider Note  ____________________________________________   I have reviewed the triage vital signs and the nursing notes. Where available I have reviewed prior notes and, if possible and indicated, outside hospital notes.    HISTORY  Chief Complaint Abdominal Pain and Urinary Tract Infection    HPI Julie Tran is a 68 y.o. female patient seen and evaluated during the coronavirus epidemic during a time with low staffing With a history she states of urinary incontinence since her AAA repair last June, she states that she has been having increased urinary incontinence over the last week with no other neurologic findings and that this morning she said she could not pee.  Does not know she is having overflow incontinence.  She states she was trying to contact her allergist but they were not responsive and so she came in here.  She came by ambulance.  She denies dysuria.  She was recently treated for UTI.  She thinks it is gone.  Does not have any flank pain she has suprapubic discomfort because "I have not been able to urinate".  This seems to be the culmination according to her of several months of gradually worsening urinary incontinence.  She has not had any fall numbness or weakness.  She denies any back pain.  She denies any fever chills or vomiting.  She states "if I could just urinate everything would be okay".  Her last urination was this morning.    Past Medical History:  Diagnosis Date  . Diabetes mellitus without complication (HCC)   . Neuropathy     There are no active problems to display for this patient.   Past Surgical History:  Procedure Laterality Date  . CARDIAC SURGERY      Prior to Admission medications   Not on File    Allergies Ace inhibitors; Contrast media [iodinated diagnostic agents]; and Latex  No family history on file.  Social History Social History   Tobacco Use  . Smoking status:  Never Smoker  . Smokeless tobacco: Never Used  Substance Use Topics  . Alcohol use: Never    Frequency: Never  . Drug use: Never    Review of Systems Constitutional: No fever/chills Eyes: No visual changes. ENT: No sore throat. No stiff neck no neck pain Cardiovascular: Denies chest pain. Respiratory: Denies shortness of breath. Gastrointestinal:   no vomiting.  No diarrhea.  No constipation. Genitourinary: Negative for dysuria. Musculoskeletal: Negative lower extremity swelling Skin: Negative for rash. Neurological: Negative for severe headaches, focal weakness or numbness.   ____________________________________________   PHYSICAL EXAM:  VITAL SIGNS: ED Triage Vitals  Enc Vitals Group     BP 10/04/18 1821 121/87     Pulse Rate 10/04/18 1821 (!) 107     Resp 10/04/18 1821 (!) 26     Temp 10/04/18 1821 98.4 F (36.9 C)     Temp Source 10/04/18 1821 Oral     SpO2 10/04/18 1821 94 %     Weight 10/04/18 1822 170 lb (77.1 kg)     Height 10/04/18 1822 5\' 3"  (1.6 m)     Head Circumference --      Peak Flow --      Pain Score 10/04/18 1822 10     Pain Loc --      Pain Edu? --      Excl. in GC? --     Constitutional: Alert and oriented. Well appearing and in no acute distress. Eyes: Conjunctivae are normal  Head: Atraumatic HEENT: No congestion/rhinnorhea. Mucous membranes are moist.  Oropharynx non-erythematous Neck:   Nontender with no meningismus, no masses, no stridor Cardiovascular: Normal rate, regular rhythm. Grossly normal heart sounds.  Good peripheral circulation. Respiratory: Normal respiratory effort.  No retractions. Lungs CTAB. Abdominal: Soft and suprapubic discomfort, some fullness there consistent with urinary retention.. No distention. No guarding no rebound Back:  There is no focal tenderness or step off.  there is no midline tenderness there are no lesions noted. there is no CVA tenderness Musculoskeletal: No lower extremity tenderness, no upper  extremity tenderness. No joint effusions, no DVT signs strong distal pulses no edema Neurologic:  Normal speech and language. No gross focal neurologic deficits are appreciated.  Skin:  Skin is warm, dry and intact. No rash noted. Psychiatric: Mood and affect are normal. Speech and behavior are normal.  ____________________________________________   LABS (all labs ordered are listed, but only abnormal results are displayed)  Labs Reviewed  URINE CULTURE  CBC WITH DIFFERENTIAL/PLATELET  URINALYSIS, COMPLETE (UACMP) WITH MICROSCOPIC  BASIC METABOLIC PANEL    Pertinent labs  results that were available during my care of the patient were reviewed by me and considered in my medical decision making (see chart for details). ____________________________________________  EKG  I personally interpreted any EKGs ordered by me or triage  ____________________________________________  RADIOLOGY  Pertinent labs & imaging results that were available during my care of the patient were reviewed by me and considered in my medical decision making (see chart for details). If possible, patient and/or family made aware of any abnormal findings.  No results found. ____________________________________________    PROCEDURES  Procedure(s) performed: None  Procedures  Critical Care performed: None  ____________________________________________   INITIAL IMPRESSION / ASSESSMENT AND PLAN / ED COURSE  Pertinent labs & imaging results that were available during my care of the patient were reviewed by me and considered in my medical decision making (see chart for details).  She has a urinary tract infection, she feels and fully better after we placed a catheter this is likely an acute on chronic pathology I do not see any indication for CT scan blood sugar is elevated, however she did not take your medications today because "I woke up with this problem" we are giving her IV fluid and antibiotics, she  is put out over 250 cc of fluid and feels much better.  Abdomen is benign at this time.  Urine culture has been sent.  I offered and talked about admission but she refused she does have a slight anion gap I think with a little insulin and fluid that should clear.  Given that she is feeling 100% better, and has a chronic history of urinary retention I do not think there is indication to look for acute neurologic pathology at this time.  She has no saddle anesthesia declines rectal exam and is in no acute medical distress.  We are going to send her home with a biotics for her UTI, however there is no prior micro findings to help guide us though we have given her information about return precautions and she has her own personal urologist that she will see tomorrow.  ----------------------------------------- 9:18 PM on 10/04/2018 -----------------------------------------  Patient noted to be tachycardic we are continuing IV fluid, we are rechecking BMP to see if her gap is closed, she definitely has urinary tract infection, urine culture is pending.  Given her anion gap, her persistent tachycardia not think she is safe to go home  she and I had another discussion she now agrees to admission I discussed with Dr. Anne Hahn, who will admit.  Agrees with management.  Appreciate consult.   ____________________________________________   FINAL CLINICAL IMPRESSION(S) / ED DIAGNOSES  Final diagnoses:  None      This chart was dictated using voice recognition software.  Despite best efforts to proofread,  errors can occur which can change meaning.      Jeanmarie Plant, MD 10/04/18 2007    Jeanmarie Plant, MD 10/04/18 2118

## 2018-10-04 NOTE — ED Triage Notes (Signed)
PT to ED via EMS from home. PT has been experiencing bladder area pain since Friday that has been getting worse, today she has not been able to urinate since early this AM but has the urge to. PT easily agitated and moaning in pain.

## 2018-10-04 NOTE — ED Notes (Signed)
ED TO INPATIENT HANDOFF REPORT  ED Nurse Name and Phone #:   Madelon Lips Name/Age/Gender Julie Tran 68 y.o. female Room/Bed: ED18A/ED18A  Code Status   Code Status: Not on file  Home/SNF/Other Home Patient oriented to: self, place, time and situation Is this baseline? Yes   Triage Complete: Triage complete  Chief Complaint Pelvic Pain  Triage Note PT to ED via EMS from home. PT has been experiencing bladder area pain since Friday that has been getting worse, today she has not been able to urinate since early this AM but has the urge to. PT easily agitated and moaning in pain.    Allergies Allergies  Allergen Reactions  . Ace Inhibitors Hives  . Contrast Media [Iodinated Diagnostic Agents]   . Latex Hives    Level of Care/Admitting Diagnosis ED Disposition    ED Disposition Condition Comment   Admit  Hospital Area: [facility] [[id number]]  Level of Care: Med-Surg [16]  Covid Evaluation: Screening Protocol (No Symptoms)  Diagnosis: UTI (urinary tract infection) [960454]  Admitting Physician: Oralia Manis [[id number]]  Attending Physician: Oralia Manis [phone]  Estimated length of stay: past midnight tomorrow  Certification:: I certify this patient will need inpatient services for at least 2 midnights  PT Class (Do Not Modify): Inpatient [101]  PT Acc Code (Do Not Modify): Private [1]       B Medical/Surgery History Past Medical History:  Diagnosis Date  . Asthma   . Diabetes mellitus without complication (HCC)   . HTN (hypertension)   . Neuropathy   . PAD (peripheral artery disease) (HCC)    Past Surgical History:  Procedure Laterality Date  . CARDIAC SURGERY       A IV Location/Drains/Wounds Patient Lines/Drains/Airways Status   Active Line/Drains/Airways    Name:   Placement date:   Placement time:   Site:   Days:   Peripheral IV 10/04/18 Left Antecubital   10/04/18    1809    Antecubital   less than 1           Intake/Output Last 24 hours  Intake/Output Summary (Last 24 hours) at 10/04/2018 2349 Last data filed at 10/04/2018 2118 Gross per 24 hour  Intake 500 ml  Output -  Net 500 ml    Labs/Imaging Results for orders placed or performed during the hospital encounter of 10/04/18 (from the past 48 hour(s))  CBC with Differential     Status: None   Collection Time: 10/04/18  6:43 PM  Result Value Ref Range   WBC 8.4 4.0 - 10.5 K/uL   RBC 4.91 3.87 - 5.11 MIL/uL   Hemoglobin 14.5 12.0 - 15.0 g/dL   HCT 21.3 08.6 - 57.8 %   MCV 92.1 80.0 - 100.0 fL   MCH 29.5 26.0 - 34.0 pg   MCHC 32.1 30.0 - 36.0 g/dL   RDW 46.9 62.9 - 52.8 %   Platelets 231 150 - 400 K/uL   nRBC 0.0 0.0 - 0.2 %   Neutrophils Relative % 76 %   Neutro Abs 6.3 1.7 - 7.7 K/uL   Lymphocytes Relative 15 %   Lymphs Abs 1.3 0.7 - 4.0 K/uL   Monocytes Relative 9 %   Monocytes Absolute 0.7 0.1 - 1.0 K/uL   Eosinophils Relative 0 %   Eosinophils Absolute 0.0 0.0 - 0.5 K/uL   Basophils Relative 0 %   Basophils Absolute 0.0 0.0 - 0.1 K/uL   Immature Granulocytes 0 %   Abs  Immature Granulocytes 0.03 0.00 - 0.07 K/uL    Comment: Performed at [facility], [address] [place], [place] [address]  Urinalysis, Complete w Microscopic     Status: Abnormal   Collection Time: 10/04/18  6:43 PM  Result Value Ref Range   Color, Urine YELLOW (A) YELLOW   APPearance HAZY (A) CLEAR   Specific Gravity, Urine 1.028 1.005 - 1.030   pH 5.0 5.0 - 8.0   Glucose, UA >=500 (A) NEGATIVE mg/dL   Hgb urine dipstick NEGATIVE NEGATIVE   Bilirubin Urine NEGATIVE NEGATIVE   Ketones, ur 80 (A) NEGATIVE mg/dL   Protein, ur 191100 (A) NEGATIVE mg/dL   Nitrite POSITIVE (A) NEGATIVE   Leukocytes,Ua NEGATIVE NEGATIVE   RBC / HPF 0-5 0 - 5 RBC/hpf   WBC, UA 11-20 0 - 5 WBC/hpf   Bacteria, UA FEW (A) NONE SEEN   Squamous Epithelial / LPF NONE SEEN 0 - 5    Comment: Performed at [facility], [address] [place], [place]  [address]  Basic metabolic panel     Status: Abnormal   Collection Time: 10/04/18  6:43 PM  Result Value Ref Range   Sodium 133 (L) 135 - 145 mmol/L   Potassium 3.4 (L) 3.5 - 5.1 mmol/L   Chloride 94 (L) 98 - 111 mmol/L   CO2 21 (L) 22 - 32 mmol/L   Glucose, Bld 416 (H) 70 - 99 mg/dL   BUN 12 8 - 23 mg/dL   Creatinine, Ser 5.620.77 0.44 - 1.00 mg/dL   Calcium 9.4 8.9 - 13.010.3 mg/dL   GFR calc non Af Amer >60 >60 mL/min   GFR calc Af Amer >60 >60 mL/min   Anion gap 18 (H) 5 - 15    Comment: Performed at [facility], [address] [place], [place] [address]  Glucose, capillary     Status: Abnormal   Collection Time: 10/04/18  8:34 PM  Result Value Ref Range   Glucose-Capillary 286 (H) 70 - 99 mg/dL  SARS Coronavirus 2 (CEPHEID - Performed in [facility] hospital lab), Hosp Order     Status: None   Collection Time: 10/04/18  9:24 PM  Result Value Ref Range   SARS Coronavirus 2 NEGATIVE NEGATIVE    Comment: (NOTE) If result is NEGATIVE SARS-CoV-2 target nucleic acids are NOT DETECTED. The SARS-CoV-2 RNA is generally detectable in upper and lower  respiratory specimens during the acute phase of infection. The lowest  concentration of SARS-CoV-2 viral copies this assay can detect is 250  copies / mL. A negative result does not preclude SARS-CoV-2 infection  and should not be used as the sole basis for treatment or other  patient management decisions.  A negative result may occur with  improper specimen collection / handling, submission of specimen other  than nasopharyngeal swab, presence of viral mutation(s) within the  areas targeted by this assay, and inadequate number of viral copies  (<250 copies / mL). A negative result must be combined with clinical  observations, patient history, and epidemiological information. If result is POSITIVE SARS-CoV-2 target nucleic acids are DETECTED. The SARS-CoV-2 RNA is generally detectable in upper and lower  respiratory specimens dur ing  the acute phase of infection.  Positive  results are indicative of active infection with SARS-CoV-2.  Clinical  correlation with patient history and other diagnostic information is  necessary to determine patient infection status.  Positive results do  not rule out bacterial infection or co-infection with  other viruses. If result is PRESUMPTIVE POSTIVE SARS-CoV-2 nucleic acids MAY BE PRESENT.   A presumptive positive result was obtained on the submitted specimen  and confirmed on repeat testing.  While 2019 novel coronavirus  (SARS-CoV-2) nucleic acids may be present in the submitted sample  additional confirmatory testing may be necessary for epidemiological  and / or clinical management purposes  to differentiate between  SARS-CoV-2 and other Sarbecovirus currently known to infect humans.  If clinically indicated additional testing with an alternate test  methodology [phone]) is advised. The SARS-CoV-2 RNA is generally  detectable in upper and lower respiratory sp ecimens during the acute  phase of infection. The expected result is Negative. Fact Sheet for Patients:  BoilerBrush.com.cy Fact Sheet for Healthcare Providers: https://pope.com/ This test is not yet approved or cleared by the [place] FDA and has been authorized for detection and/or diagnosis of SARS-CoV-2 by FDA under an Emergency Use Authorization (EUA).  This EUA will remain in effect (meaning this test can be used) for the duration of the COVID-19 declaration under Section 564(b)(1) of the Act, 21 U.S.C. section 360bbb-3(b)(1), unless the authorization is terminated or revoked sooner. Performed at [facility], [address] [place], [place] [address]   Basic metabolic panel     Status: Abnormal   Collection Time: 10/04/18  9:24 PM  Result Value Ref Range   Sodium 135 135 - 145 mmol/L   Potassium 3.4 (L) 3.5 - 5.1 mmol/L   Chloride 102 98 - 111  mmol/L   CO2 19 (L) 22 - 32 mmol/L   Glucose, Bld 313 (H) 70 - 99 mg/dL   BUN 11 8 - 23 mg/dL   Creatinine, Ser 8.14 0.44 - 1.00 mg/dL   Calcium 8.7 (L) 8.9 - 10.3 mg/dL   GFR calc non Af Amer >60 >60 mL/min   GFR calc Af Amer >60 >60 mL/min   Anion gap 14 5 - 15    Comment: Performed at [facility], [address], [place], [place] [address]   No results found.  Pending Labs Unresulted Labs (From admission, onward)    Start     Ordered   10/04/18 1825  Urine culture  Once,   STAT     10/04/18 1824   Signed and Held  HIV antibody (Routine Testing)  Once,   R     Signed and Held   Signed and Held  CBC  (enoxaparin (LOVENOX)    CrCl >/= 30 ml/min)  Once,   R    Comments:  Baseline for enoxaparin therapy IF NOT ALREADY DRAWN.  Notify MD if PLT < 100 K.    Signed and Held   Signed and Held  Creatinine, serum  (enoxaparin (LOVENOX)    CrCl >/= 30 ml/min)  Once,   R    Comments:  Baseline for enoxaparin therapy IF NOT ALREADY DRAWN.    Signed and Held   Signed and Held  Creatinine, serum  (enoxaparin (LOVENOX)    CrCl >/= 30 ml/min)  Weekly,   R    Comments:  while on enoxaparin therapy    Signed and Held   Signed and Held  Basic metabolic panel  Tomorrow morning,   R     Signed and Held   Signed and Held  CBC  Tomorrow morning,   R     Signed and Held   Signed and Held  Hemoglobin A1c  Tomorrow morning,   R  Signed and Held          Vitals/Pain Today's Vitals   10/04/18 1945 10/04/18 2015 10/04/18 2030 10/04/18 2132  BP: 110/73  103/78 (!) 126/98  Pulse:  (!) 107  (!) 113  Resp:    (!) 24  Temp:      TempSrc:      SpO2: 96% 95%  100%  Weight:      Height:      PainSc:        Isolation Precautions No active isolations  Medications Medications  cefTRIAXone (ROCEPHIN) 1 g in sodium chloride 0.9 % 100 mL IVPB (0 g Intravenous Stopped 10/04/18 2118)  sodium chloride 0.9 % bolus 500 mL (0 mLs Intravenous Stopped 10/04/18 2118)  insulin aspart (novoLOG)  injection 6 Units (6 Units Intravenous Given 10/04/18 1941)    Mobility walks with device Moderate fall risk   Focused Assessments Renal Assessment Handoff:        R Recommendations: See Admitting Provider Note  Report given to:   Additional Notes:

## 2018-10-04 NOTE — H&P (Signed)
[facility] [place] [facility]   PATIENT NAME: Julie BjorkJanie Tran    MR#:  [id number]  DATE OF BIRTH:  08/14/1950  DATE OF ADMISSION:  10/04/2018  PRIMARY CARE PHYSICIAN: System, Pcp Not In   REQUESTING/REFERRING PHYSICIAN: Alphonzo LemmingsMcShane, MD  CHIEF COMPLAINT:   Chief Complaint  Patient presents with  . Abdominal Pain  . Urinary Tract Infection    HISTORY OF PRESENT ILLNESS:  Julie BjorkJanie Cookston  is a 68 y.o. female who presents with chief complaint as above.  Patient presented to the ED with a complaint of urinary frequency and dysuria.  She is found here to have nitrite positive UA strongly suspicious for UTI.  She was also in very mild DKA.  Hospitalist were called for admission.  PAST MEDICAL HISTORY:   Past Medical History:  Diagnosis Date  . Asthma   . Diabetes mellitus without complication (HCC)   . HTN (hypertension)   . Neuropathy   . PAD (peripheral artery disease) (HCC)      PAST SURGICAL HISTORY:   Past Surgical History:  Procedure Laterality Date  . CARDIAC SURGERY       SOCIAL HISTORY:   Social History   Tobacco Use  . Smoking status: Never Smoker  . Smokeless tobacco: Never Used  Substance Use Topics  . Alcohol use: Never    Frequency: Never     FAMILY HISTORY:    Family history reviewed and is non-contributory DRUG ALLERGIES:   Allergies  Allergen Reactions  . Ace Inhibitors Hives  . Contrast Media [Iodinated Diagnostic Agents]   . Latex Hives    MEDICATIONS AT HOME:   Prior to Admission medications   Medication Sig Start Date End Date Taking? Authorizing Provider  cephALEXin (KEFLEX) 500 MG capsule Take 1 capsule (500 mg total) by mouth 3 (three) times daily for 7 days. 10/04/18 10/11/18  Jeanmarie PlantMcShane, James A, MD    REVIEW OF SYSTEMS:  Review of Systems  Constitutional: Negative for chills, fever, malaise/fatigue and weight loss.  HENT: Negative for ear pain, hearing loss and tinnitus.   Eyes: Negative for blurred  vision, double vision, pain and redness.  Respiratory: Negative for cough, hemoptysis and shortness of breath.   Cardiovascular: Negative for chest pain, palpitations, orthopnea and leg swelling.  Gastrointestinal: Negative for abdominal pain, constipation, diarrhea, nausea and vomiting.  Genitourinary: Positive for dysuria and frequency. Negative for hematuria.  Musculoskeletal: Negative for back pain, joint pain and neck pain.  Skin:       No acne, rash, or lesions  Neurological: Negative for dizziness, tremors, focal weakness and weakness.  Endo/Heme/Allergies: Negative for polydipsia. Does not bruise/bleed easily.  Psychiatric/Behavioral: Negative for depression. The patient is not nervous/anxious and does not have insomnia.      VITAL SIGNS:   Vitals:   10/04/18 1945 10/04/18 2015 10/04/18 2030 10/04/18 2132  BP: 110/73  103/78 (!) 126/98  Pulse:  (!) 107  (!) 113  Resp:    (!) 24  Temp:      TempSrc:      SpO2: 96% 95%  100%  Weight:      Height:       Wt Readings from Last 3 Encounters:  10/04/18 77.1 kg    PHYSICAL EXAMINATION:  Physical Exam  Vitals reviewed. Constitutional: She is oriented to person, place, and time. She appears well-developed and well-nourished. No distress.  HENT:  Head: Normocephalic and atraumatic.  Mouth/Throat: Oropharynx is clear and moist.  Eyes: Pupils are equal,  round, and reactive to light. Conjunctivae and EOM are normal. No scleral icterus.  Neck: Normal range of motion. Neck supple. No JVD present. No thyromegaly present.  Cardiovascular: Normal rate, regular rhythm and intact distal pulses. Exam reveals no gallop and no friction rub.  No murmur heard. Respiratory: Effort normal and breath sounds normal. No respiratory distress. She has no wheezes. She has no rales.  GI: Soft. Bowel sounds are normal. She exhibits no distension. There is abdominal tenderness.  Musculoskeletal: Normal range of motion.        General: No edema.      Comments: No arthritis, no gout  Lymphadenopathy:    She has no cervical adenopathy.  Neurological: She is alert and oriented to person, place, and time. No cranial nerve deficit.  No dysarthria, no aphasia  Skin: Skin is warm and dry. No rash noted. No erythema.  Psychiatric: She has a normal mood and affect. Her behavior is normal. Judgment and thought content normal.    LABORATORY PANEL:   CBC Recent Labs  Lab 10/04/18 1843  WBC 8.4  HGB 14.5  HCT 45.2  PLT 231   ------------------------------------------------------------------------------------------------------------------  Chemistries  Recent Labs  Lab 10/04/18 2124  NA 135  K 3.4*  CL 102  CO2 19*  GLUCOSE 313*  BUN 11  CREATININE 0.57  CALCIUM 8.7*   ------------------------------------------------------------------------------------------------------------------  Cardiac Enzymes No results for input(s): TROPONINI in the last 168 hours. ------------------------------------------------------------------------------------------------------------------  RADIOLOGY:  No results found.  EKG:  No orders found for this or any previous visit.  IMPRESSION AND PLAN:  Principal Problem:   UTI (urinary tract infection) -IV antibiotics started, urine culture sent. Active Problems:   DKA (diabetic ketoacidoses) (HCC) -very mild with anion gap of only 18 and glucose in the 200s.  After IV fluids in the ED and some insulin her anion gap closed.  We will give her some bicarb tonight to help finish correcting her electrolytes.  We will keep her on sliding scale insulin with carb modified diet.   HTN (hypertension) -home dose antihypertensives   Asthma -home dose inhalers  Chart review performed and case discussed with ED provider. Labs, imaging and/or ECG reviewed by provider and discussed with patient/family. Management plans discussed with the patient and/or family.  COVID-19 status: Test pending  DVT PROPHYLAXIS:  SubQ lovenox   GI PROPHYLAXIS:  None  ADMISSION STATUS: Inpatient     CODE STATUS: Full  TOTAL TIME TAKING CARE OF THIS PATIENT: 45 minutes.   This patient was evaluated in the context of the global COVID-19 pandemic, which necessitated consideration that the patient might be at risk for infection with the SARS-CoV-2 virus that causes COVID-19. Institutional protocols and algorithms that pertain to the evaluation of patients at risk for COVID-19 are in a state of rapid change based on information released by regulatory bodies including the CDC and federal and state organizations. These policies and algorithms were followed to the best of this provider's knowledge to date during the patient's care at this facility.  Barney Drain 10/04/2018, 10:05 PM  [facility]  Office  [phone]  CC: Primary care physician; System, Pcp Not In  Note:  This document was prepared using Dragon voice recognition software and may include unintentional dictation errors.

## 2018-10-05 ENCOUNTER — Other Ambulatory Visit: Payer: Self-pay

## 2018-10-05 LAB — GLUCOSE, CAPILLARY
Glucose-Capillary: 332 mg/dL — ABNORMAL HIGH (ref 70–99)
Glucose-Capillary: 360 mg/dL — ABNORMAL HIGH (ref 70–99)
Glucose-Capillary: 378 mg/dL — ABNORMAL HIGH (ref 70–99)
Glucose-Capillary: 294 mg/dL — ABNORMAL HIGH (ref 70–99)
Glucose-Capillary: 276 mg/dL — ABNORMAL HIGH (ref 70–99)

## 2018-10-05 LAB — CBC
HCT: 44.1 % (ref 36.0–46.0)
Hemoglobin: 14.4 g/dL (ref 12.0–15.0)
MCH: 30.1 pg (ref 26.0–34.0)
MCHC: 32.7 g/dL (ref 30.0–36.0)
MCV: 92.3 fL (ref 80.0–100.0)
Platelets: 239 10*3/uL (ref 150–400)
RBC: 4.78 MIL/uL (ref 3.87–5.11)
RDW: 13.7 % (ref 11.5–15.5)
WBC: 10.1 10*3/uL (ref 4.0–10.5)
nRBC: 0 % (ref 0.0–0.2)

## 2018-10-05 LAB — BASIC METABOLIC PANEL
Anion gap: 14 (ref 5–15)
BUN: 17 mg/dL (ref 8–23)
CO2: 20 mmol/L — ABNORMAL LOW (ref 22–32)
Calcium: 8.9 mg/dL (ref 8.9–10.3)
Chloride: 99 mmol/L (ref 98–111)
Creatinine, Ser: 0.69 mg/dL (ref 0.44–1.00)
GFR calc Af Amer: >60 mL/min (ref >60)
GFR calc non Af Amer: >60 mL/min (ref >60)
Glucose, Bld: 371 mg/dL — ABNORMAL HIGH (ref 70–99)
Potassium: 3.6 mmol/L (ref 3.5–5.1)
Sodium: 133 mmol/L — ABNORMAL LOW (ref 135–145)

## 2018-10-05 LAB — HEMOGLOBIN A1C
Hgb A1c MFr Bld: 12.9 % — ABNORMAL HIGH (ref 4.8–5.6)
Mean Plasma Glucose: 323.53 mg/dL

## 2018-10-05 MED ORDER — BELLADONNA ALKALOIDS-OPIUM 16.2-60 MG RE SUPP
0.5000 | Freq: Three times a day (TID) | RECTAL | Status: DC | PRN
  Administered 2018-10-05: 23:00:00 0.5 via RECTAL
  Filled 2018-10-05 (×3): qty 1

## 2018-10-05 MED ORDER — ONDANSETRON HCL 4 MG PO TABS: 4.0000 mg | ORAL_TABLET | Freq: Four times a day (QID) | ORAL | Status: DC | PRN

## 2018-10-05 MED ORDER — ENOXAPARIN SODIUM 40 MG/0.4ML ~~LOC~~ SOLN
40.0000 mg | SUBCUTANEOUS | Status: DC
  Administered 2018-10-05 – 2018-10-06 (×2): 40 mg via SUBCUTANEOUS
  Filled 2018-10-05 (×3): qty 0.4

## 2018-10-05 MED ORDER — SODIUM CHLORIDE 0.9 % IV SOLN
INTRAVENOUS | Status: DC | PRN
  Administered 2018-10-05 – 2018-10-06 (×2): 20 mL via INTRAVENOUS
  Administered 2018-10-07: 07:00:00 1000 mL via INTRAVENOUS

## 2018-10-05 MED ORDER — POTASSIUM CHLORIDE CRYS ER 20 MEQ PO TBCR
20.0000 meq | EXTENDED_RELEASE_TABLET | Freq: Every day | ORAL | Status: DC
  Administered 2018-10-05 – 2018-10-06 (×2): 20 meq via ORAL
  Filled 2018-10-05 (×2): qty 1

## 2018-10-05 MED ORDER — ONDANSETRON HCL 4 MG/2ML IJ SOLN: 4.0000 mg | Freq: Four times a day (QID) | INTRAMUSCULAR | Status: DC | PRN

## 2018-10-05 MED ORDER — ACETAMINOPHEN 325 MG PO TABS
650.0000 mg | ORAL_TABLET | Freq: Four times a day (QID) | ORAL | Status: DC | PRN
  Administered 2018-10-08 – 2018-10-12 (×2): 650 mg via ORAL
  Filled 2018-10-05 (×2): qty 2

## 2018-10-05 MED ORDER — SODIUM CHLORIDE 0.9 % IV SOLN
INTRAVENOUS | Status: AC
  Administered 2018-10-05 (×2): via INTRAVENOUS

## 2018-10-05 MED ORDER — SODIUM CHLORIDE 0.9 % IV SOLN
1.0000 g | INTRAVENOUS | Status: DC
  Administered 2018-10-05: 1 g via INTRAVENOUS
  Filled 2018-10-05: qty 10
  Filled 2018-10-05: qty 1

## 2018-10-05 MED ORDER — ACETAMINOPHEN 650 MG RE SUPP: 650.0000 mg | Freq: Four times a day (QID) | RECTAL | Status: DC | PRN

## 2018-10-05 MED ORDER — INSULIN GLARGINE 100 UNIT/ML ~~LOC~~ SOLN
15.0000 [IU] | Freq: Every day | SUBCUTANEOUS | Status: DC
  Administered 2018-10-05 – 2018-10-06 (×2): 15 [IU] via SUBCUTANEOUS
  Filled 2018-10-05 (×2): qty 0.15

## 2018-10-05 MED ORDER — INSULIN ASPART 100 UNIT/ML ~~LOC~~ SOLN
0.0000 [IU] | Freq: Every day | SUBCUTANEOUS | Status: DC
  Administered 2018-10-05: 5 [IU] via SUBCUTANEOUS
  Administered 2018-10-05: 3 [IU] via SUBCUTANEOUS
  Administered 2018-10-06: 22:00:00 2 [IU] via SUBCUTANEOUS
  Administered 2018-10-07 – 2018-10-10 (×2): 5 [IU] via SUBCUTANEOUS
  Filled 2018-10-05 (×5): qty 1

## 2018-10-05 MED ORDER — INSULIN STARTER KIT- SYRINGES (ENGLISH)
1.0000 | Freq: Once | Status: AC
  Administered 2018-10-05: 1
  Filled 2018-10-05: qty 1

## 2018-10-05 MED ORDER — INSULIN ASPART 100 UNIT/ML ~~LOC~~ SOLN: 0.0000 [IU] | Freq: Every day | SUBCUTANEOUS | Status: DC

## 2018-10-05 MED ORDER — SODIUM BICARBONATE 650 MG PO TABS
650.0000 mg | ORAL_TABLET | Freq: Once | ORAL | Status: DC
  Filled 2018-10-05: qty 1

## 2018-10-05 MED ORDER — MAGNESIUM OXIDE 400 (241.3 MG) MG PO TABS
800.0000 mg | ORAL_TABLET | Freq: Two times a day (BID) | ORAL | Status: DC
  Administered 2018-10-05 – 2018-10-12 (×15): 800 mg via ORAL
  Filled 2018-10-05 (×15): qty 2

## 2018-10-05 MED ORDER — INSULIN ASPART 100 UNIT/ML ~~LOC~~ SOLN
0.0000 [IU] | Freq: Three times a day (TID) | SUBCUTANEOUS | Status: DC
  Administered 2018-10-05: 12:00:00 9 [IU] via SUBCUTANEOUS
  Administered 2018-10-05: 5 [IU] via SUBCUTANEOUS
  Administered 2018-10-05 – 2018-10-06 (×3): 9 [IU] via SUBCUTANEOUS
  Administered 2018-10-06: 08:00:00 5 [IU] via SUBCUTANEOUS
  Administered 2018-10-07: 9 [IU] via SUBCUTANEOUS
  Administered 2018-10-07: 7 [IU] via SUBCUTANEOUS
  Administered 2018-10-07: 5 [IU] via SUBCUTANEOUS
  Administered 2018-10-08: 09:00:00 3 [IU] via SUBCUTANEOUS
  Administered 2018-10-08: 5 [IU] via SUBCUTANEOUS
  Administered 2018-10-08: 3 [IU] via SUBCUTANEOUS
  Administered 2018-10-09 (×2): 2 [IU] via SUBCUTANEOUS
  Administered 2018-10-10: 12:00:00 3 [IU] via SUBCUTANEOUS
  Administered 2018-10-10: 5 [IU] via SUBCUTANEOUS
  Administered 2018-10-11: 09:00:00 7 [IU] via SUBCUTANEOUS
  Administered 2018-10-11: 3 [IU] via SUBCUTANEOUS
  Administered 2018-10-11: 5 [IU] via SUBCUTANEOUS
  Administered 2018-10-12: 12:00:00 3 [IU] via SUBCUTANEOUS
  Administered 2018-10-12: 2 [IU] via SUBCUTANEOUS
  Filled 2018-10-05 (×22): qty 1

## 2018-10-05 MED ORDER — ENOXAPARIN SODIUM 40 MG/0.4ML ~~LOC~~ SOLN: 40.0000 mg | SUBCUTANEOUS | Status: DC

## 2018-10-05 MED ORDER — ASPIRIN EC 81 MG PO TBEC
81.0000 mg | DELAYED_RELEASE_TABLET | Freq: Every day | ORAL | Status: DC
  Administered 2018-10-05 – 2018-10-12 (×8): 81 mg via ORAL
  Filled 2018-10-05 (×8): qty 1

## 2018-10-05 MED ORDER — SODIUM CHLORIDE 0.9 % IV BOLUS
500.0000 mL | Freq: Once | INTRAVENOUS | Status: AC
  Administered 2018-10-05: 500 mL via INTRAVENOUS

## 2018-10-05 MED ORDER — SODIUM CHLORIDE 0.9 % IV SOLN: 1.0000 g | INTRAVENOUS | Status: DC

## 2018-10-05 NOTE — ED Notes (Signed)
ED TO INPATIENT HANDOFF REPORT  ED Nurse Name and Phone #:  Madelon Lips Name/Age/Gender Julie Tran 68 y.o. female Room/Bed: ED18A/ED18A  Code Status   Code Status: Not on file  Home/SNF/Other Home Patient oriented to: self, place, time and situation Is this baseline? Yes   Triage Complete: Triage complete  Chief Complaint Pelvic Pain  Triage Note PT to ED via EMS from home. PT has been experiencing bladder area pain since Friday that has been getting worse, today she has not been able to urinate since early this AM but has the urge to. PT easily agitated and moaning in pain.    Allergies Allergies  Allergen Reactions  . Ace Inhibitors Hives and Swelling    Had angioedema with lisinopril. Seen in [facility] ED for this.  Had angioedema with lisinopril. Seen in [facility] ED for this.    . Contrast Media [Iodinated Diagnostic Agents]   . Iodine Swelling  . Latex Hives and Swelling  . Penicillins Hives  . Salicylates Hives  . Shellfish Allergy Swelling    Level of Care/Admitting Diagnosis ED Disposition    ED Disposition Condition Comment   Admit  Hospital Area: [facility] [[id number]]  Level of Care: Med-Surg [16]  Covid Evaluation: Screening Protocol (No Symptoms)  Diagnosis: UTI (urinary tract infection) [295621]  Admitting Physician: Oralia Manis [[id number]]  Attending Physician: Oralia Manis [phone]  Estimated length of stay: past midnight tomorrow  Certification:: I certify this patient will need inpatient services for at least 2 midnights  PT Class (Do Not Modify): Inpatient [101]  PT Acc Code (Do Not Modify): Private [1]       B Medical/Surgery History Past Medical History:  Diagnosis Date  . Asthma   . Diabetes mellitus without complication (HCC)   . HTN (hypertension)   . Neuropathy   . PAD (peripheral artery disease) (HCC)    Past Surgical History:  Procedure Laterality Date  . CARDIAC SURGERY       A IV  Location/Drains/Wounds Patient Lines/Drains/Airways Status   Active Line/Drains/Airways    Name:   Placement date:   Placement time:   Site:   Days:   Peripheral IV 10/04/18 Left Antecubital   10/04/18    1809    Antecubital   1          Intake/Output Last 24 hours  Intake/Output Summary (Last 24 hours) at 10/05/2018 0439 Last data filed at 10/05/2018 0326 Gross per 24 hour  Intake 740 ml  Output 1100 ml  Net -360 ml    Labs/Imaging Results for orders placed or performed during the hospital encounter of 10/04/18 (from the past 48 hour(s))  CBC with Differential     Status: None   Collection Time: 10/04/18  6:43 PM  Result Value Ref Range   WBC 8.4 4.0 - 10.5 K/uL   RBC 4.91 3.87 - 5.11 MIL/uL   Hemoglobin 14.5 12.0 - 15.0 g/dL   HCT 28.4 13.2 - 44.0 %   MCV 92.1 80.0 - 100.0 fL   MCH 29.5 26.0 - 34.0 pg   MCHC 32.1 30.0 - 36.0 g/dL   RDW 10.2 72.5 - 36.6 %   Platelets 231 150 - 400 K/uL   nRBC 0.0 0.0 - 0.2 %   Neutrophils Relative % 76 %   Neutro Abs 6.3 1.7 - 7.7 K/uL   Lymphocytes Relative 15 %   Lymphs Abs 1.3 0.7 - 4.0 K/uL   Monocytes Relative 9 %   Monocytes  Absolute 0.7 0.1 - 1.0 K/uL   Eosinophils Relative 0 %   Eosinophils Absolute 0.0 0.0 - 0.5 K/uL   Basophils Relative 0 %   Basophils Absolute 0.0 0.0 - 0.1 K/uL   Immature Granulocytes 0 %   Abs Immature Granulocytes 0.03 0.00 - 0.07 K/uL    Comment: Performed at [facility], [address] [place], [place] [address]  Urinalysis, Complete w Microscopic     Status: Abnormal   Collection Time: 10/04/18  6:43 PM  Result Value Ref Range   Color, Urine YELLOW (A) YELLOW   APPearance HAZY (A) CLEAR   Specific Gravity, Urine 1.028 1.005 - 1.030   pH 5.0 5.0 - 8.0   Glucose, UA >=500 (A) NEGATIVE mg/dL   Hgb urine dipstick NEGATIVE NEGATIVE   Bilirubin Urine NEGATIVE NEGATIVE   Ketones, ur 80 (A) NEGATIVE mg/dL   Protein, ur 191100 (A) NEGATIVE mg/dL   Nitrite POSITIVE (A) NEGATIVE   Leukocytes,Ua  NEGATIVE NEGATIVE   RBC / HPF 0-5 0 - 5 RBC/hpf   WBC, UA 11-20 0 - 5 WBC/hpf   Bacteria, UA FEW (A) NONE SEEN   Squamous Epithelial / LPF NONE SEEN 0 - 5    Comment: Performed at [facility], [address] [place], [place] [address]  Basic metabolic panel     Status: Abnormal   Collection Time: 10/04/18  6:43 PM  Result Value Ref Range   Sodium 133 (L) 135 - 145 mmol/L   Potassium 3.4 (L) 3.5 - 5.1 mmol/L   Chloride 94 (L) 98 - 111 mmol/L   CO2 21 (L) 22 - 32 mmol/L   Glucose, Bld 416 (H) 70 - 99 mg/dL   BUN 12 8 - 23 mg/dL   Creatinine, Ser 5.620.77 0.44 - 1.00 mg/dL   Calcium 9.4 8.9 - 13.010.3 mg/dL   GFR calc non Af Amer >60 >60 mL/min   GFR calc Af Amer >60 >60 mL/min   Anion gap 18 (H) 5 - 15    Comment: Performed at [facility], [address] [place], [place] [address]  Glucose, capillary     Status: Abnormal   Collection Time: 10/04/18  8:34 PM  Result Value Ref Range   Glucose-Capillary 286 (H) 70 - 99 mg/dL  SARS Coronavirus 2 (CEPHEID - Performed in [facility] hospital lab), Hosp Order     Status: None   Collection Time: 10/04/18  9:24 PM  Result Value Ref Range   SARS Coronavirus 2 NEGATIVE NEGATIVE    Comment: (NOTE) If result is NEGATIVE SARS-CoV-2 target nucleic acids are NOT DETECTED. The SARS-CoV-2 RNA is generally detectable in upper and lower  respiratory specimens during the acute phase of infection. The lowest  concentration of SARS-CoV-2 viral copies this assay can detect is 250  copies / mL. A negative result does not preclude SARS-CoV-2 infection  and should not be used as the sole basis for treatment or other  patient management decisions.  A negative result may occur with  improper specimen collection / handling, submission of specimen other  than nasopharyngeal swab, presence of viral mutation(s) within the  areas targeted by this assay, and inadequate number of viral copies  (<250 copies / mL). A negative result must be combined  with clinical  observations, patient history, and epidemiological information. If result is POSITIVE SARS-CoV-2 target nucleic acids are DETECTED. The SARS-CoV-2 RNA is generally detectable in upper and lower  respiratory specimens dur ing the acute phase  of infection.  Positive  results are indicative of active infection with SARS-CoV-2.  Clinical  correlation with patient history and other diagnostic information is  necessary to determine patient infection status.  Positive results do  not rule out bacterial infection or co-infection with other viruses. If result is PRESUMPTIVE POSTIVE SARS-CoV-2 nucleic acids MAY BE PRESENT.   A presumptive positive result was obtained on the submitted specimen  and confirmed on repeat testing.  While 2019 novel coronavirus  (SARS-CoV-2) nucleic acids may be present in the submitted sample  additional confirmatory testing may be necessary for epidemiological  and / or clinical management purposes  to differentiate between  SARS-CoV-2 and other Sarbecovirus currently known to infect humans.  If clinically indicated additional testing with an alternate test  methodology [phone]) is advised. The SARS-CoV-2 RNA is generally  detectable in upper and lower respiratory sp ecimens during the acute  phase of infection. The expected result is Negative. Fact Sheet for Patients:  BoilerBrush.com.cy Fact Sheet for Healthcare Providers: https://pope.com/ This test is not yet approved or cleared by the [place] FDA and has been authorized for detection and/or diagnosis of SARS-CoV-2 by FDA under an Emergency Use Authorization (EUA).  This EUA will remain in effect (meaning this test can be used) for the duration of the COVID-19 declaration under Section 564(b)(1) of the Act, 21 U.S.C. section 360bbb-3(b)(1), unless the authorization is terminated or revoked sooner. Performed at [facility], [address] [place], [place] [address]   Basic metabolic panel     Status: Abnormal   Collection Time: 10/04/18  9:24 PM  Result Value Ref Range   Sodium 135 135 - 145 mmol/L   Potassium 3.4 (L) 3.5 - 5.1 mmol/L   Chloride 102 98 - 111 mmol/L   CO2 19 (L) 22 - 32 mmol/L   Glucose, Bld 313 (H) 70 - 99 mg/dL   BUN 11 8 - 23 mg/dL   Creatinine, Ser 8.11 0.44 - 1.00 mg/dL   Calcium 8.7 (L) 8.9 - 10.3 mg/dL   GFR calc non Af Amer >60 >60 mL/min   GFR calc Af Amer >60 >60 mL/min   Anion gap 14 5 - 15    Comment: Performed at [facility], [address] [place], [place] [address]  Glucose, capillary     Status: Abnormal   Collection Time: 10/05/18  1:08 AM  Result Value Ref Range   Glucose-Capillary 332 (H) 70 - 99 mg/dL   No results found.  Pending Labs Unresulted Labs (From admission, onward)    Start     Ordered   10/04/18 1825  Urine culture  Once,   STAT     10/04/18 1824   Signed and Held  HIV antibody (Routine Testing)  Once,   R     Signed and Held   Signed and Held  CBC  (enoxaparin (LOVENOX)    CrCl >/= 30 ml/min)  Once,   R    Comments:  Baseline for enoxaparin therapy IF NOT ALREADY DRAWN.  Notify MD if PLT < 100 K.    Signed and Held   Signed and Held  Creatinine, serum  (enoxaparin (LOVENOX)    CrCl >/= 30 ml/min)  Once,   R    Comments:  Baseline for enoxaparin therapy IF NOT ALREADY DRAWN.    Signed and Held   Signed and Held  Creatinine, serum  (enoxaparin (LOVENOX)    CrCl >/= 30 ml/min)  Weekly,  R    Comments:  while on enoxaparin therapy    Signed and Held   Signed and Held  Basic metabolic panel  Tomorrow morning,   R     Signed and Held   Signed and Held  CBC  Tomorrow morning,   R     Signed and Held   Signed and Held  Hemoglobin A1c  Tomorrow morning,   R     Signed and Held          Vitals/Pain Today's Vitals   10/05/18 0100 10/05/18 0100 10/05/18 0110 10/05/18 0131  BP:   (!) 108/92 (!) 120/99  Pulse: (!) 112  (!) 116 (!) 110   Resp:      Temp:      TempSrc:      SpO2: 95%  97% 96%  Weight:      Height:      PainSc:  Asleep      Isolation Precautions No active isolations  Medications Medications  insulin aspart (novoLOG) injection 0-9 Units (has no administration in time range)  sodium bicarbonate tablet 650 mg (has no administration in time range)  insulin aspart (novoLOG) injection 0-5 Units (5 Units Subcutaneous Given 10/05/18 0319)  cefTRIAXone (ROCEPHIN) 1 g in sodium chloride 0.9 % 100 mL IVPB (0 g Intravenous Stopped 10/04/18 2118)  sodium chloride 0.9 % bolus 500 mL (0 mLs Intravenous Stopped 10/04/18 2118)  insulin aspart (novoLOG) injection 6 Units (6 Units Intravenous Given 10/04/18 1941)    Mobility walks with device Moderate fall risk   Focused Assessments Renal Assessment Handoff:         R Recommendations: See Admitting Provider Note  Report given to:   Additional Notes:  foley

## 2018-10-05 NOTE — Progress Notes (Signed)
Inpatient Diabetes Program Recommendations  AACE/ADA: New Consensus Statement on Inpatient Glycemic Control   Target Ranges:  Prepandial:   less than 140 mg/dL      Peak postprandial:   less than 180 mg/dL (1-2 hours)      Critically ill patients:  140 - 180 mg/dL  Results for Julie Tran, Julie Tran (MRN [id number]) as of 10/05/2018 10:54  Ref. Range 10/04/2018 20:34 10/05/2018 01:08 10/05/2018 07:45  Glucose-Capillary Latest Ref Range: 70 - 99 mg/dL 286 (H) 332 (H) 360 (H)   Results for Julie Tran, Julie Tran (MRN [id number]) as of 10/05/2018 10:54  Ref. Range 10/04/2018 18:43 10/04/2018 21:24 10/05/2018 06:24  Glucose Latest Ref Range: 70 - 99 mg/dL 416 (H) 313 (H) 371 (H)  Hemoglobin A1C Latest Ref Range: 4.8 - 5.6 %   12.9 (H)   Review of Glycemic Control  Diabetes history: DM2 Outpatient Diabetes medications: Metformin 1000 mg BID Current orders for Inpatient glycemic control: Novolog 0-9 units TID with meals, Novolog 0-5 units QHS  Inpatient Diabetes Program Recommendations:   Insulin - Basal: Please consider ordering Lantus 15 units Q24H starting now (based on 77 kg x 0.2 units).  Insulin-Meal Coverage: Please consider ordering Novolog 4 units TID with meals for meal coverage if patient eats at least 50% of meals.  HgbA1C: A1C 12.9% on 10/05/18 indicating an average glucose of 324 mg/dl over the past 2-3 months. Noted A1C in Care Everywhere 12.5% on 06/29/18. Patient likely needs to take insulin as an outpatient for DM control.  NOTE: In reviewing chart, noted patient was inpatient at [facility] 10/30/2017 to 11/13/2017 and was discharged to SNF on 11/13/2017 on Lantus 28 units daily, Humalog 10 units TID with meals, plus Humalog correction scale. Per home medication list in chart, patient only has Metformin listed as outpatient DM medications. Patient needs to be on insulin as an outpatient.   Spoke with patient about diabetes and home regimen for diabetes control. Patient reports being followed by PCP for diabetes  management and was taking only Metformin 1000 mg BID as an outpatient for diabetes control but notes that she has not had any medication in a few days because she left them in [place]. Patient notes that she will need prescription for Metformin so she can get more medication as she will not be going back to [place].  Inquired about taking insulin in the past and patient denies ever taking insulin as an outpatient. Explained that it was noted she was discharged to rehab facility last year from [facility] on 11/13/2017 and it was noted Lantus and Humalog insulin was prescribed. In further talking with patient, she reported that they gave her a prescription for insulin pens when she was discharged from the rehab facility but when she went to pick it up, her cost was over $400 so she did not get it filled. Patient states that she talked with her PCP and it was decided to just keep her on Metformin since she could not afford the insulin.  Patient states that she was never shown or explained anything about insulin at the rehab facility prior to discharge. Discussed initial glucose of 416 mg/dl on 10/04/18 and A1C results (12.9% on 10/05/18) and explained that current A1C indicates an average glucose of 324 mg/dl over the past 2-3 months. Discussed glucose and A1C goals.  Discussed importance of checking CBGs and maintaining good CBG control to prevent long-term and short-term complications. Explained how hyperglycemia leads to damage within blood vessels which lead to the common complications seen  with uncontrolled diabetes. Stressed to the patient the importance of improving glycemic control to prevent further complications from uncontrolled diabetes. Discussed impact of nutrition, exercise, stress, sickness, and medications on diabetes control.  Discussed using insulin as an outpatient. Patient states that if it expensive she will not take insulin. Discussed Lantus and Novolog as ordered and discussed cost of vials and pens if  patient does not have prescription coverage (which would be over $400 per box of insulin pens).  Informed patient that CM would be consulted to run a benefit check to see if she has prescription coverage and if so how much copays would be.  If patient has no prescription coverage, would recommend patient use more affordable insulins at [employer] (Novolin R, NPH, or 70/30) which are $25 per vial or $43 for box of 5 insulin pens.  Patient states she could afford $43 per box of insulin pens if she does not have prescription coverage with Medicare.  Educated patient on vial/syringe and insulin pen use. Reviewed contents of insulin starter kit. Patient states that she would prefer to use insulin pens if prescribed insulin at discharge.  Reviewed all steps of insulin pen including attachment of needle, 2-unit air shot, dialing up dose, giving injection, removing needle, disposal of sharps, storage of unused insulin, disposal of insulin etc. Patient able to provide successful return demonstration. Will ask nursing to allow patient to self-administer insulin injections while inpatient. Informed patient that vial/syringe is used while inpatient and that nursing will be asking her to self inject insulin shots. Reminded patient that CM would be consulted and that diabetes coordinator will follow up on discharge plan if discharged on insulin so that whichever insulins are prescribed she would be able to afford them.  Patient verbalized understanding of information discussed and reports no further questions at this time related to diabetes.  Thanks, Barnie Alderman, RN, MSN, CDE Diabetes Coordinator Inpatient Diabetes Program [phone] (Team Pager from 8am to 5pm)

## 2018-10-05 NOTE — Progress Notes (Signed)
[employer] [facility] - [place] at [facility]   PATIENT NAME: Julie Tran    MR#:  [id number]  DATE OF BIRTH:  July 08, 1950  SUBJECTIVE:  CHIEF COMPLAINT:   Chief Complaint  Patient presents with  . Abdominal Pain  . Urinary Tract Infection   Has lower abd pain.  Foley cathter placed due to urinary retention.  REVIEW OF SYSTEMS:    Review of Systems  Constitutional: Positive for malaise/fatigue. Negative for chills and fever.  HENT: Negative for sore throat.   Eyes: Negative for blurred vision, double vision and pain.  Respiratory: Negative for cough, hemoptysis, shortness of breath and wheezing.   Cardiovascular: Negative for chest pain, palpitations, orthopnea and leg swelling.  Gastrointestinal: Positive for abdominal pain and nausea. Negative for constipation, diarrhea, heartburn and vomiting.  Genitourinary: Negative for dysuria and hematuria.  Musculoskeletal: Negative for back pain and joint pain.  Skin: Negative for rash.  Neurological: Negative for sensory change, speech change, focal weakness and headaches.  Endo/Heme/Allergies: Does not bruise/bleed easily.  Psychiatric/Behavioral: Negative for depression. The patient is not nervous/anxious.    DRUG ALLERGIES:   Allergies  Allergen Reactions  . Ace Inhibitors Hives and Swelling    Had angioedema with lisinopril. Seen in [facility] ED for this.  Had angioedema with lisinopril. Seen in [facility] ED for this.    . Contrast Media [Iodinated Diagnostic Agents]   . Iodine Swelling  . Latex Hives and Swelling  . Penicillins Hives  . Salicylates Hives  . Shellfish Allergy Swelling   VITALS:  Blood pressure (!) 151/69, pulse (!) 106, temperature 97.7 F (36.5 C), temperature source Oral, resp. rate 18, height 5\' 3"  (1.6 m), weight 77.1 kg, SpO2 97 %.  PHYSICAL EXAMINATION:   Physical Exam  GENERAL:  68 y.o.-year-old patient lying in the bed with no acute distress.  EYES: Pupils equal, round, reactive to  light and accommodation. No scleral icterus. Extraocular muscles intact.  HEENT: Head atraumatic, normocephalic. Oropharynx and nasopharynx clear.  NECK:  Supple, no jugular venous distention. No thyroid enlargement, no tenderness.  LUNGS: Normal breath sounds bilaterally, no wheezing, rales, rhonchi. No use of accessory muscles of respiration.  CARDIOVASCULAR: S1, S2 normal. No murmurs, rubs, or gallops.  ABDOMEN: Soft, nontender, nondistended. Bowel sounds present. No organomegaly or mass.  Foley.  EXTREMITIES: No cyanosis, clubbing or edema b/l.    NEUROLOGIC: Cranial nerves II through XII are intact. No focal Motor or sensory deficits b/l.   PSYCHIATRIC: The patient is alert and oriented x 3.  SKIN: No obvious rash, lesion, or ulcer.   LABORATORY PANEL:   CBC Recent Labs  Lab 10/05/18 0624  WBC 10.1  HGB 14.4  HCT 44.1  PLT 239   ------------------------------------------------------------------------------------------------------------------ Chemistries  Recent Labs  Lab 10/05/18 0624  NA 133*  K 3.6  CL 99  CO2 20*  GLUCOSE 371*  BUN 17  CREATININE 0.69  CALCIUM 8.9   ------------------------------------------------------------------------------------------------------------------  Cardiac Enzymes No results for input(s): TROPONINI in the last 168 hours. ------------------------------------------------------------------------------------------------------------------  RADIOLOGY:  No results found.   ASSESSMENT AND PLAN:    * UTI with urinary retention On IV abx cx pending Has issues previously with urinary retention Will need foley at discharge and urology f/u   * Uncontrolled DM with hyperglycemia On metformin For hemoglobin A1c.  Blood sugars in 300 here.  On sliding scale.   * HTN (hypertension) Meds held    * Asthma -home dose inhalers  All the records are reviewed and case  discussed with Care Management/Social Worker Management plans  discussed with the patient, family and they are in agreement.  CODE STATUS: FULL CODE  DVT Prophylaxis: SCDs  TOTAL TIME TAKING CARE OF THIS PATIENT: 35 minutes.   POSSIBLE D/C IN 1-2 DAYS, DEPENDING ON CLINICAL CONDITION.  Molinda BailiffSrikar R Kadija Cruzen M.D on 10/05/2018 at 10:38 AM  Between 7am to 6pm - Pager - [phone]  After 6pm go to [email or web address] - password [email or web address]  [facility] [place] [facility]  Office  [phone]  CC: Primary care physician; System, Pcp Not In  Note: This dictation was prepared with Dragon dictation along with smaller phrase technology. Any transcriptional errors that result from this process are unintentional.

## 2018-10-06 ENCOUNTER — Encounter: Payer: Self-pay | Admitting: Radiology

## 2018-10-06 ENCOUNTER — Inpatient Hospital Stay: Payer: Medicare Other

## 2018-10-06 LAB — GLUCOSE, CAPILLARY
Glucose-Capillary: 299 mg/dL — ABNORMAL HIGH (ref 70–99)
Glucose-Capillary: 400 mg/dL — ABNORMAL HIGH (ref 70–99)
Glucose-Capillary: 474 mg/dL — ABNORMAL HIGH (ref 70–99)
Glucose-Capillary: 216 mg/dL — ABNORMAL HIGH (ref 70–99)

## 2018-10-06 LAB — HIV ANTIBODY (ROUTINE TESTING W REFLEX): HIV Screen 4th Generation wRfx: NONREACTIVE

## 2018-10-06 MED ORDER — INSULIN ASPART 100 UNIT/ML ~~LOC~~ SOLN
3.0000 [IU] | Freq: Three times a day (TID) | SUBCUTANEOUS | Status: DC
  Administered 2018-10-06 (×2): 3 [IU] via SUBCUTANEOUS
  Filled 2018-10-06 (×2): qty 1

## 2018-10-06 MED ORDER — INSULIN GLARGINE 100 UNIT/ML ~~LOC~~ SOLN
22.0000 [IU] | Freq: Every day | SUBCUTANEOUS | Status: DC
  Filled 2018-10-06: qty 0.22

## 2018-10-06 MED ORDER — IOHEXOL 300 MG/ML  SOLN
100.0000 mL | Freq: Once | INTRAMUSCULAR | Status: AC | PRN
  Administered 2018-10-06: 100 mL via INTRAVENOUS

## 2018-10-06 MED ORDER — CEFAZOLIN SODIUM-DEXTROSE 1-4 GM/50ML-% IV SOLN
1.0000 g | Freq: Three times a day (TID) | INTRAVENOUS | Status: DC
  Administered 2018-10-06 – 2018-10-07 (×4): 1 g via INTRAVENOUS
  Filled 2018-10-06 (×6): qty 50

## 2018-10-06 MED ORDER — PREDNISONE 50 MG PO TABS
50.0000 mg | ORAL_TABLET | Freq: Four times a day (QID) | ORAL | Status: AC
  Administered 2018-10-06 (×3): 50 mg via ORAL
  Filled 2018-10-06 (×3): qty 1

## 2018-10-06 MED ORDER — IOHEXOL 240 MG/ML SOLN
25.0000 mL | INTRAMUSCULAR | Status: AC
  Administered 2018-10-06 (×2): 25 mL via ORAL

## 2018-10-06 MED ORDER — DIPHENHYDRAMINE HCL 50 MG/ML IJ SOLN
50.0000 mg | Freq: Once | INTRAMUSCULAR | Status: AC
  Administered 2018-10-06: 50 mg via INTRAVENOUS
  Filled 2018-10-06: qty 1

## 2018-10-06 MED ORDER — DIPHENHYDRAMINE HCL 25 MG PO CAPS: 50.0000 mg | ORAL_CAPSULE | Freq: Once | ORAL | Status: AC

## 2018-10-06 MED ORDER — OXYBUTYNIN CHLORIDE 5 MG PO TABS
5.0000 mg | ORAL_TABLET | Freq: Once | ORAL | Status: AC
  Administered 2018-10-06: 5 mg via ORAL
  Filled 2018-10-06: qty 1

## 2018-10-06 MED ORDER — CARVEDILOL 6.25 MG PO TABS
6.2500 mg | ORAL_TABLET | Freq: Two times a day (BID) | ORAL | Status: DC
  Administered 2018-10-06 – 2018-10-12 (×13): 6.25 mg via ORAL
  Filled 2018-10-06: qty 1
  Filled 2018-10-06 (×3): qty 2
  Filled 2018-10-06: qty 1
  Filled 2018-10-06: qty 2
  Filled 2018-10-06 (×5): qty 1
  Filled 2018-10-06 (×5): qty 2
  Filled 2018-10-06: qty 1
  Filled 2018-10-06: qty 2
  Filled 2018-10-06 (×2): qty 1
  Filled 2018-10-06: qty 2
  Filled 2018-10-06 (×4): qty 1
  Filled 2018-10-06: qty 2

## 2018-10-06 MED ORDER — TRAMADOL HCL 50 MG PO TABS
50.0000 mg | ORAL_TABLET | Freq: Four times a day (QID) | ORAL | Status: DC | PRN
  Administered 2018-10-06 – 2018-10-12 (×7): 50 mg via ORAL
  Filled 2018-10-06 (×8): qty 1

## 2018-10-06 MED ORDER — AMLODIPINE BESYLATE 5 MG PO TABS
5.0000 mg | ORAL_TABLET | Freq: Every day | ORAL | Status: DC
  Administered 2018-10-06 – 2018-10-12 (×7): 5 mg via ORAL
  Filled 2018-10-06 (×7): qty 1

## 2018-10-06 NOTE — TOC Initial Note (Signed)
Transition of Care [facility] - Initial/Assessment Note    Patient Details  Name: Julie Tran MRN: [id number] Date of Birth: 05-10-1950  Transition of Care [facility] CM/SW Contact:    Collie Siad, RN Phone Number: 10/06/2018, 2:25 PM  Clinical Narrative:                 [facility] consult received for medication assistance. She uses Novolog ($133 at [facility]) , Lantus ([facility] discounted cost  $203 Good Rx) , and Metformin ($ at [facility]).  Expected Discharge Plan: Home/Self Care     Patient Goals and CMS Choice        Expected Discharge Plan and Services Expected Discharge Plan: Home/Self Care         Expected Discharge Date: 10/10/18                                    Prior Living Arrangements/Services                       Activities of Daily Living Home Assistive Devices/Equipment: Gilmer Mor (specify quad or straight)(quad) ADL Screening (condition at time of admission) Patient's cognitive ability adequate to safely complete daily activities?: Yes Is the patient deaf or have difficulty hearing?: No Does the patient have difficulty seeing, even when wearing glasses/contacts?: No Does the patient have difficulty concentrating, remembering, or making decisions?: No Patient able to express need for assistance with ADLs?: Yes Does the patient have difficulty dressing or bathing?: No Independently performs ADLs?: Yes (appropriate for developmental age) Does the patient have difficulty walking or climbing stairs?: Yes Weakness of Legs: None Weakness of Arms/Hands: None  Permission Sought/Granted                  Emotional Assessment              Admission diagnosis:  Lower urinary tract infectious disease [N39.0] Urinary retention [R33.9] Patient Active Problem List   Diagnosis Date Noted  . UTI (urinary tract infection) 10/04/2018  . DKA (diabetic ketoacidoses) (HCC) 10/04/2018  . HTN (hypertension) 10/04/2018  . Asthma 10/04/2018   PCP:  System,  Pcp Not In Pharmacy:  No Pharmacies Listed    Social Determinants of Health (SDOH) Interventions    Readmission Risk Interventions No flowsheet data found.

## 2018-10-06 NOTE — Progress Notes (Signed)
MD aware of BS 474, new order to give 15u of novolog total

## 2018-10-06 NOTE — Progress Notes (Signed)
[employer] [facility] - [place] at [facility]   PATIENT NAME: Julie Tran    MR#:  [id number]  DATE OF BIRTH:  1950/09/06  SUBJECTIVE:  CHIEF COMPLAINT:   Chief Complaint  Patient presents with  . Abdominal Pain  . Urinary Tract Infection   Still has lower abdominal pain/spasms. Foley catheter in place.  No hematuria.  REVIEW OF SYSTEMS:    Review of Systems  Constitutional: Positive for malaise/fatigue. Negative for chills and fever.  HENT: Negative for sore throat.   Eyes: Negative for blurred vision, double vision and pain.  Respiratory: Negative for cough, hemoptysis, shortness of breath and wheezing.   Cardiovascular: Negative for chest pain, palpitations, orthopnea and leg swelling.  Gastrointestinal: Positive for abdominal pain and nausea. Negative for constipation, diarrhea, heartburn and vomiting.  Genitourinary: Negative for dysuria and hematuria.  Musculoskeletal: Negative for back pain and joint pain.  Skin: Negative for rash.  Neurological: Negative for sensory change, speech change, focal weakness and headaches.  Endo/Heme/Allergies: Does not bruise/bleed easily.  Psychiatric/Behavioral: Negative for depression. The patient is not nervous/anxious.    DRUG ALLERGIES:   Allergies  Allergen Reactions  . Ace Inhibitors Hives and Swelling    Had angioedema with lisinopril. Seen in [facility] ED for this.  Had angioedema with lisinopril. Seen in [facility] ED for this.    . Contrast Media [Iodinated Diagnostic Agents]   . Iodine Swelling  . Latex Hives and Swelling  . Penicillins Hives  . Salicylates Hives  . Shellfish Allergy Swelling   VITALS:  Blood pressure 134/69, pulse (!) 111, temperature 99 F (37.2 C), temperature source Oral, resp. rate 18, height 5\' 3"  (1.6 m), weight 77.1 kg, SpO2 97 %.  PHYSICAL EXAMINATION:   Physical Exam  GENERAL:  68 y.o.-year-old patient lying in the bed.  In distress due to pain EYES: Pupils equal, round,  reactive to light and accommodation. No scleral icterus. Extraocular muscles intact.  HEENT: Head atraumatic, normocephalic. Oropharynx and nasopharynx clear.  NECK:  Supple, no jugular venous distention. No thyroid enlargement, no tenderness.  LUNGS: Normal breath sounds bilaterally, no wheezing, rales, rhonchi. No use of accessory muscles of respiration.  CARDIOVASCULAR: S1, S2 normal. No murmurs, rubs, or gallops.  ABDOMEN: Soft, nontender, nondistended. Bowel sounds present. No organomegaly or mass.  Foley.  EXTREMITIES: No cyanosis, clubbing or edema b/l.    NEUROLOGIC: Cranial nerves II through XII are intact. No focal Motor or sensory deficits b/l.   PSYCHIATRIC: The patient is alert and oriented x 3.  SKIN: No obvious rash, lesion, or ulcer.   LABORATORY PANEL:   CBC Recent Labs  Lab 10/05/18 0624  WBC 10.1  HGB 14.4  HCT 44.1  PLT 239   ------------------------------------------------------------------------------------------------------------------ Chemistries  Recent Labs  Lab 10/05/18 0624  NA 133*  K 3.6  CL 99  CO2 20*  GLUCOSE 371*  BUN 17  CREATININE 0.69  CALCIUM 8.9   ------------------------------------------------------------------------------------------------------------------  Cardiac Enzymes No results for input(s): TROPONINI in the last 168 hours. ------------------------------------------------------------------------------------------------------------------  RADIOLOGY:  No results found.   ASSESSMENT AND PLAN:    * UTI with urinary retention On IV abx Urine cultures with gram-negative rods.  Await for final ID and sensitivities. Has issues previously with urinary retention Will need foley at discharge and urology f/u. Has significant suprapubic area pain.  Will order CT scan of the abdomen and pelvis.  She does have contrast allergy but tells me that she has tolerated it well with prednisone dosing.  Ordered  contrast allergy protocol  with Benadryl and prednisone.   * Uncontrolled DM with hyperglycemia On metformin at home Hemoglobin A1c 12.9.  Blood sugars in 300 here.  On sliding scale. Added Lantus 15 units yesterday. Blood sugars continue to be elevated up to 300.  Will increase Lantus to 22 units.  Ordered 3 units NovoLog with meals.   * HTN (hypertension) Meds held on admission.  Reordered Coreg and amlodipine.    * Asthma -home dose inhalers  All the records are reviewed and case discussed with Care Management/Social Worker Management plans discussed with the patient, family and they are in agreement.  CODE STATUS: FULL CODE  DVT Prophylaxis: SCDs  TOTAL TIME TAKING CARE OF THIS PATIENT: 35 minutes.   POSSIBLE D/C IN 1-2 DAYS, DEPENDING ON CLINICAL CONDITION.  Molinda BailiffSrikar R Nikolina Simerson M.D on 10/06/2018 at 8:32 AM  Between 7am to 6pm - Pager - [phone]  After 6pm go to [email or web address] - password [email or web address]  [facility] [place] [facility]  Office  [phone]  CC: Primary care physician; System, Pcp Not In  Note: This dictation was prepared with Dragon dictation along with smaller phrase technology. Any transcriptional errors that result from this process are unintentional.

## 2018-10-06 NOTE — Progress Notes (Signed)
Inpatient Diabetes Program Recommendations  AACE/ADA: New Consensus Statement on Inpatient Glycemic Control   Target Ranges:  Prepandial:   less than 140 mg/dL      Peak postprandial:   less than 180 mg/dL (1-2 hours)      Critically ill patients:  140 - 180 mg/dL   Results for Julie Tran, Julie Tran (MRN [id number]) as of 10/06/2018 07:42  Ref. Range 10/05/2018 07:45 10/05/2018 11:57 10/05/2018 17:14 10/05/2018 20:30 10/06/2018 07:20  Glucose-Capillary Latest Ref Range: 70 - 99 mg/dL 615 (H) 379 (H) 432 (H) 276 (H) 299 (H)   Review of Glycemic Control Diabetes history: DM2 Outpatient Diabetes medications: Metformin 1000 mg BID Current orders for Inpatient glycemic control: Lantus 15 units daily, Novolog 0-9 units TID with meals, Novolog 0-5 units QHS  Inpatient Diabetes Program Recommendations:   Insulin - Basal: Please consider ordering Lantus 20 units daily.  Insulin-Meal Coverage: Please consider ordering Novolog 5 units TID with meals for meal coverage if patient eats at least 50% of meals.  HgbA1C: A1C 12.9% on 10/05/18 indicating an average glucose of 324 mg/dl over the past 2-3 months. Noted A1C in Care Everywhere 12.5% on 06/29/18. Patient likely needs to take insulin as an outpatient for DM control.  NOTE: Diabetes Coordinator spoke with patient on 10/05/18 regarding insulin as an outpatient. Patient is willing to take insulin injections with an insulin pen if insulin is prescribed a discharge. However, patient states that if insulin is expensive she will not take it as she can not afford expensive insulin. Consulted CM on 10/05/18 to ask if they can check to see if patient has prescription coverage and what copays would be for Lantus and Novolog insulin pens. If patient does not have prescription coverage, would recommend patient use more affordable insulins at [facility] (Novolin R, NPH, or 70/30) which are $43 for box of 5 insulin pens.    Thanks, Orlando Penner, RN, MSN, CDE Diabetes  Coordinator Inpatient Diabetes Program [phone] (Team Pager from 8am to 5pm)

## 2018-10-06 NOTE — TOC Progression Note (Addendum)
Transition of Care [facility]) - Progression Note    Patient Details  Name: Kiasha Bellin MRN: [id number] Date of Birth: 06-20-1950  Transition [facility] CM/SW Contact  Marshell Garfinkel, RN Phone Number: 10/06/2018, 2:43 PM  Clinical Narrative:    RNCM met with patient to assess for discharge planning needs. She states she thinks she has [employer]. She states she is only in the area for two months and states "there are legal reasons that I cannot share my personal information".  She would not say who her PCP was or where she was getting her medications from.  She states she only uses Metformin because Lantus and Novolog are too expensive.  I have provided Good Rx coupons for both but still cost remains too high.  She shows that she has Medicare A B.  I have requested SWA  to check with [facility] to see if she has drug coverage.  Update- she does not have [place] and she does not have drug coverage.  I have sent request to Medication Management to see if they can assist.  I shared my concerns that she reported about "legal issues and only staying in are for 2 months" with charge nurse.   Expected Discharge Plan: Home/Self Care    Expected Discharge Plan and Services Expected Discharge Plan: Home/Self Care         Expected Discharge Date: 10/10/18                                     Social Determinants of Health (SDOH) Interventions    Readmission Risk Interventions No flowsheet data found.

## 2018-10-07 ENCOUNTER — Inpatient Hospital Stay: Payer: Medicare Other

## 2018-10-07 DIAGNOSIS — R109 Unspecified abdominal pain: Secondary | ICD-10-CM

## 2018-10-07 DIAGNOSIS — R339 Retention of urine, unspecified: Secondary | ICD-10-CM

## 2018-10-07 LAB — CBC WITH DIFFERENTIAL/PLATELET
Abs Immature Granulocytes: 0.21 10*3/uL — ABNORMAL HIGH (ref 0.00–0.07)
Basophils Absolute: 0 10*3/uL (ref 0.0–0.1)
Basophils Relative: 0 %
Eosinophils Absolute: 0 10*3/uL (ref 0.0–0.5)
Eosinophils Relative: 0 %
HCT: 40.4 % (ref 36.0–46.0)
Hemoglobin: 13.1 g/dL (ref 12.0–15.0)
Immature Granulocytes: 1 %
Lymphocytes Relative: 5 %
Lymphs Abs: 0.8 10*3/uL (ref 0.7–4.0)
MCH: 29.8 pg (ref 26.0–34.0)
MCHC: 32.4 g/dL (ref 30.0–36.0)
MCV: 91.8 fL (ref 80.0–100.0)
Monocytes Absolute: 0.8 10*3/uL (ref 0.1–1.0)
Monocytes Relative: 5 %
Neutro Abs: 13.3 10*3/uL — ABNORMAL HIGH (ref 1.7–7.7)
Neutrophils Relative %: 89 %
Platelets: 245 10*3/uL (ref 150–400)
RBC: 4.4 MIL/uL (ref 3.87–5.11)
RDW: 14 % (ref 11.5–15.5)
Smear Review: NORMAL
WBC: 15.1 10*3/uL — ABNORMAL HIGH (ref 4.0–10.5)
nRBC: 0 % (ref 0.0–0.2)

## 2018-10-07 LAB — CHLAMYDIA/NGC RT PCR (ARMC ONLY)
Chlamydia Tr: NOT DETECTED
N gonorrhoeae: NOT DETECTED

## 2018-10-07 LAB — BASIC METABOLIC PANEL
Anion gap: 11 (ref 5–15)
BUN: 24 mg/dL — ABNORMAL HIGH (ref 8–23)
CO2: 22 mmol/L (ref 22–32)
Calcium: 8.5 mg/dL — ABNORMAL LOW (ref 8.9–10.3)
Chloride: 97 mmol/L — ABNORMAL LOW (ref 98–111)
Creatinine, Ser: 0.59 mg/dL (ref 0.44–1.00)
GFR calc Af Amer: >60 mL/min (ref >60)
GFR calc non Af Amer: >60 mL/min (ref >60)
Glucose, Bld: 539 mg/dL (ref 70–99)
Potassium: 4.7 mmol/L (ref 3.5–5.1)
Sodium: 130 mmol/L — ABNORMAL LOW (ref 135–145)

## 2018-10-07 LAB — GLUCOSE, CAPILLARY
Glucose-Capillary: 418 mg/dL — ABNORMAL HIGH (ref 70–99)
Glucose-Capillary: 258 mg/dL — ABNORMAL HIGH (ref 70–99)
Glucose-Capillary: 337 mg/dL — ABNORMAL HIGH (ref 70–99)
Glucose-Capillary: 352 mg/dL — ABNORMAL HIGH (ref 70–99)

## 2018-10-07 LAB — RAPID HIV SCREEN (HIV 1/2 AB+AG)
HIV 1/2 Antibodies: NONREACTIVE
HIV-1 P24 Antigen - HIV24: NONREACTIVE

## 2018-10-07 LAB — URINE CULTURE: Culture: >=100000 — AB

## 2018-10-07 MED ORDER — SODIUM CHLORIDE 0.9 % IV SOLN
INTRAVENOUS | Status: DC
  Administered 2018-10-07 – 2018-10-12 (×11): via INTRAVENOUS

## 2018-10-07 MED ORDER — SODIUM CHLORIDE 0.9 % IV SOLN
100.0000 mg | Freq: Two times a day (BID) | INTRAVENOUS | Status: DC
  Administered 2018-10-07 – 2018-10-09 (×4): 100 mg via INTRAVENOUS
  Filled 2018-10-07 (×6): qty 100

## 2018-10-07 MED ORDER — INSULIN GLARGINE 100 UNIT/ML ~~LOC~~ SOLN
30.0000 [IU] | Freq: Every day | SUBCUTANEOUS | Status: DC
  Administered 2018-10-07: 30 [IU] via SUBCUTANEOUS
  Filled 2018-10-07 (×2): qty 0.3

## 2018-10-07 MED ORDER — INSULIN REGULAR HUMAN 100 UNIT/ML IJ SOLN
10.0000 [IU] | Freq: Once | INTRAMUSCULAR | Status: AC
  Administered 2018-10-07: 10 [IU] via INTRAVENOUS
  Filled 2018-10-07: qty 10

## 2018-10-07 MED ORDER — SODIUM CHLORIDE 0.9 % IV SOLN
2.0000 g | Freq: Two times a day (BID) | INTRAVENOUS | Status: DC
  Administered 2018-10-07 – 2018-10-08 (×4): 2 g via INTRAVENOUS
  Filled 2018-10-07 (×7): qty 2

## 2018-10-07 MED ORDER — ENOXAPARIN SODIUM 40 MG/0.4ML ~~LOC~~ SOLN
40.0000 mg | SUBCUTANEOUS | Status: DC
  Administered 2018-10-08 – 2018-10-09 (×2): 40 mg via SUBCUTANEOUS
  Filled 2018-10-07 (×5): qty 0.4

## 2018-10-07 MED ORDER — INSULIN ASPART 100 UNIT/ML ~~LOC~~ SOLN
7.0000 [IU] | Freq: Three times a day (TID) | SUBCUTANEOUS | Status: DC
  Administered 2018-10-07 (×2): 7 [IU] via SUBCUTANEOUS
  Filled 2018-10-07 (×3): qty 1

## 2018-10-07 NOTE — Consult Note (Signed)
GYNECOLOGY CONSULT NOTE  GYN Consultation  Attending Provider: Jama Flavorsjie, Jude, MD   Julie Tran [id number] 10/07/2018 9:32 PM    Reason for Consultation:   Julie Tran is a 68 y.o. menopausal female seen at the request of Dr. Enid Baasjie for evaluation of CT and ultrasound findings of pelvic fluid collections concerning for abscess.    History of Present Ilness:   The patient was admitted on 10/04/2018 for urinary retention in the setting of tachycardia, an anion gap, and elevated blood glucose, concerning for DKA.  She is a tangential historian who states that her pain began about a week ago.  However, she does refer to times further back where she was also experiencing her pain.  The pain is sometimes described as sharp, other times as pressure.  She rates the pain as sometimes moderate and sometimes severe. The pain is located in her pelvic floor (based on her description of the location).  The pain does not radiate.  Nothing alleviates her pain (even after placement of the Foley catheter for her urinary retention).  Pushing on her abdomen makes her pain worse. She notes no associated symptoms, apart from alternately having urinary incontinence, which is her predominant state, to having urinary retention.  She has no vaginal bleeding. She has been seen by a urologist for her urinary retention in the past.  There is a telemedicine note dated 10/02/2018, in the system from Binnie KandJessica Anderson, MD, from [facility].  She does not recall exactly when she had her last pap smear, but believes it was about two years ago and was normal. She denies a history of abnormal pap smears.  She states that she has not had intercourse in at least seven years.  She denies significant weight changes, bloating, and early satiety.  She denies fevers and chills.  On admission she was diagnosed with a now culture-proven UTI. Her abdominal pain has not improved with treatment. A CT abdomen/pelvis was obtained yesterday that showed  finding in her pelvis concerning for abscess.  Gynecology has been consulted due to this finding.   Past Medical History:  Diagnosis Date  . Asthma   . Diabetes mellitus without complication (HCC)   . HTN (hypertension)   . Neuropathy   . PAD (peripheral artery disease) (HCC)    Past Surgical History:  Procedure Laterality Date  . CARDIAC SURGERY     Allergies  Allergen Reactions  . Ace Inhibitors Hives and Swelling    Had angioedema with lisinopril. [facility].  Had angioedema with lisinopril. [facility].    . Contrast Media [Iodinated Diagnostic Agents]   . Iodine Swelling  . Latex Hives and Swelling  . Penicillins Hives  . Salicylates Hives  . Shellfish Allergy Swelling   Prior to Admission medications   Medication Sig Start Date End Date Taking? Authorizing Provider  amLODipine (NORVASC) 5 MG tablet Take 5 mg by mouth daily. 05/13/16  Yes [provider]  carvedilol (COREG) 6.25 MG tablet Take 6.2 mg by mouth every 12 (twelve) hours. 11/12/17 11/12/18 Yes [provider]  hydrochlorothiazide (HYDRODIURIL) 25 MG tablet Take 25 mg by mouth daily. 11/13/17 11/13/18 Yes [provider]  magnesium oxide (MAG-OX) 400 MG tablet Take 800 mg by mouth 2 (two) times daily. 11/12/17 11/12/18 Yes [provider]  metFORMIN (GLUCOPHAGE) 1000 MG tablet Take 1,000 mg by mouth 2 (two) times daily with a meal. 12/15/15 11/12/18 Yes [provider]  Potassium Chloride  ER 20 MEQ TBCR Take 20 mEq by mouth daily. 11/13/17  Yes [provider]  aspirin EC 81 MG tablet Take 81 mg by mouth daily. 11/13/17 11/13/18  [provider]  cephALEXin (KEFLEX) 500 MG capsule Take 1 capsule (500 mg total) by mouth 3 (three) times daily for 7 days. 10/04/18 10/11/18  Jeanmarie Plant, MD    Social History:  She  reports that she has never smoked. She has never used smokeless tobacco. She reports that she does not drink alcohol or  use drugs.  Family History:  Denies family history of gynecologic malignancy.  Review of Systems:   Review of Systems  Constitutional: Negative.  Negative for chills, fever and weight loss.  HENT: Negative.   Eyes: Negative.   Respiratory: Negative.   Cardiovascular: Negative.   Gastrointestinal: Positive for abdominal pain. Negative for blood in stool, constipation, diarrhea, heartburn, melena, nausea and vomiting.  Genitourinary: Positive for dysuria.       See HPI for gynecology-specific ROS  Musculoskeletal: Negative.   Skin: Negative.   Neurological: Positive for tingling (bilateral legs from neuropathy). Negative for dizziness, tremors, sensory change, speech change, focal weakness, seizures, loss of consciousness, weakness and headaches.  Psychiatric/Behavioral: Negative.      Objective    BP (!) 148/76 (BP Location: Right Arm)   Pulse 92   Temp 98.4 F (36.9 C) (Oral)   Resp 16   Ht  (1.6 m)   Wt 77.1 kg   SpO2 99%   BMI 30.11 kg/m  Physical Exam  Physical Exam Constitutional:      General: She is not in acute distress.    Appearance: Normal appearance.  Genitourinary:     Pelvic exam was performed with patient in the lithotomy position.     Cervix normal.     Vulva exam comments: External erythema, no lesions, no blood noted.     Urethra exam comments: Foley catheter in place obscures exam.     No lesions in the vagina.     Vaginal tenderness and atrophic mucosa present.     No vaginal erythema, bleeding or rugosity.     Genitourinary Comments: Pelvic exam (patien on bed pain on bed): The cervix is severely retropubic and anterior, so very difficult to visualize under these exam conditions.  The cervix is partially visualized and no obvious lesions are noted.  On bimanual, she is very tender, though she tolerates the exam well.  There is fullness in her pelvis and she is to uncomfortable to allow a complete bimanual exam. So, I can not comment on her  uterus, adnexal, or parametrial structures. Her cervix does not palpate as if it has any lesions.  No CMT. Rectal is deferred given her level of discomfort.   HENT:     Head: Normocephalic and atraumatic.  Eyes:     General: No scleral icterus.    Conjunctiva/sclera: Conjunctivae normal.  Cardiovascular:     Rate and Rhythm: Normal rate and regular rhythm.  Pulmonary:     Effort: Pulmonary effort is normal.     Breath sounds: No wheezing, rhonchi or rales.  Abdominal:     General: There is no distension.     Palpations: Abdomen is soft. There is no mass.     Tenderness: There is abdominal tenderness (diffuse, worse in lower abdomen). There is no right CVA tenderness, left CVA tenderness or rebound.  Musculoskeletal:        General: Tenderness (bilateral lower extremities.  She is wearing tennis shoes in bed to help with her discomfort) present.  Neurological:     General: No focal deficit present.     Mental Status: She is alert.     Cranial Nerves: No cranial nerve deficit.  Skin:    General: Skin is warm.     Coloration: Skin is not jaundiced.  Psychiatric:        Mood and Affect: Mood normal.        Behavior: Behavior normal.        Judgment: Judgment normal.    Female chaperone present for pelvic exam:  Laboratory Results:   Lab Results  Component Value Date   WBC 15.1 (H) 10/07/2018   RBC 4.40 10/07/2018   HGB 13.1 10/07/2018   HCT 40.4 10/07/2018   PLT 245 10/07/2018   NA 130 (L) 10/07/2018   K 4.7 10/07/2018   CREATININE 0.59 10/07/2018   Imaging Results:  Ct Abdomen Pelvis W Contrast  Result Date: 10/06/2018 CLINICAL DATA:  Diffuse abdominal pain, urinary frequency and dysuria. EXAM: CT ABDOMEN AND PELVIS WITH CONTRAST TECHNIQUE: Multidetector CT imaging of the abdomen and pelvis was performed using the standard protocol following bolus administration of intravenous contrast. CONTRAST:  OMNIPAQUE IOHEXOL 300 MG/ML  SOLN COMPARISON:  None. FINDINGS: Lower  chest: Unremarkable. Hepatobiliary: Diffuse low density of the liver relative to the spleen. Normal appearing gallbladder. Pancreas: Mild diffuse pancreatic atrophy. Spleen: Normal in size without focal abnormality. Adrenals/Urinary Tract: Normal appearing adrenal glands. Bilateral renal cysts. Foley catheter in the urinary bladder with no significant urine in the bladder. The visualized portions of the ureters are unremarkable. Stomach/Bowel: Prominent stool in the right, transverse and descending colon and proximal sigmoid colon. 1.4 cm oval fat density mass within a small bowel loop in the mid upper pelvis. The remainder of the small bowel has a normal appearance as do the stomach and appendix. Vascular/Lymphatic: Tortuous aorta. No enlarged lymph nodes. Reproductive: Enlarged uterus containing multiple densely calcified fibroids. Multiple fluid collections with rim enhancement surrounding the uterus, most pronounced superior to the uterus on the right. The ovaries are difficult to separate from these collections. Other: Mild presacral edema. Musculoskeletal: Lumbar and lower thoracic spine degenerative changes. IMPRESSION: 1. Multiple pelvic fluid collections with surrounding rim enhancement surrounding the uterus, suspicious for abscesses, most pronounced superior to the uterus on the right. 2. 1.4 cm lipoma within a small bowel loop in the mid upper pelvis. 3. Diffuse hepatic steatosis. 4. Prominent stool in the colon. Electronically Signed   By: Beckie Salts M.D.   On: 10/06/2018 21:38   US Pelvic Complete With Transvaginal  Result Date: 10/07/2018 CLINICAL DATA:  68 year old female with suspected pelvic abscesses on CT Abdomen and Pelvis yesterday. Pain and dysuria. EXAM: TRANSABDOMINAL AND TRANSVAGINAL ULTRASOUND OF PELVIS TECHNIQUE: Both transabdominal and transvaginal ultrasound examinations of the pelvis were performed. Transabdominal technique was performed for global imaging of the pelvis including  uterus, ovaries, adnexal regions, and pelvic cul-de-sac. It was necessary to proceed with endovaginal exam following the transabdominal exam in an attempt to visualize the ovaries. COMPARISON:  CT Abdomen and Pelvis 10/06/2018. FINDINGS: Uterus Measurements: 13.5 x 7.3 x 9.7 = volume: 503 mL. Shadowing calcified fibroids as seen on the CT ranging from 2.6-3.4 centimeters diameter (image 38). Endometrium Thickness: 8 millimeters. The endometrium appears mildly distorted by the uterine fibroids. Right ovary Measurements: Not identified despite transabdominal and transvaginal imaging. Left ovary Measurements: Not identified despite transabdominal and transvaginal imaging. Other findings  Tubular appearing parametrial fluid collections redemonstrated (images 67 and 92) with mild internal echogenic debris, no vascular elements. The collections appear mildly septated and loculated in areas (image 149). It is unclear whether there might be a superimposed small volume of free pelvic fluid (probably not). Foley catheter balloon visible within the decompressed urinary bladder. IMPRESSION: 1. Multiple tubular parametrial fluid collections re-demonstrated with mild internal echogenic debris, mild septation/loculation. No vascular elements identified. These remain suspicious for Abscesses. Pyosalpinx is a possibility. 2. Neither ovary could be identified. 3. Multiple calcified uterine fibroids again noted. Upper limits of normal endometrial thickness (8 mm). Electronically Signed   By: Odessa Fleming M.D.   On: 10/07/2018 12:03     Assessment & Recommendations   Julie Tran is a 68 y.o. menopausal female with abdominal pain and urinary retention with possible pelvic abscess(es) or at least pelvic fluid collections being seen in consultation.  In a menopausal patient this is highly concerning for malignancy, as this is not a common finding.  To this end, I did consult with gynecologic oncology at [facility], who agreed with my  recommendations and plan.   Recommendations:  1.  Treat infection as PID, per CDC guidelines.  Recommend changing antibiotic coverage to Cefotetan 2 grams IV Q 12 hours or Cefoxitin 2 grams IV Q 6 hours.  Plus, add doxycycline 100 mg either PO or IV Q 12 hours. She should receive a total of 2 weeks of treatment. 2.  Re-image as needed, but within at least a month, if clinically improved.   3. She will need outpatient follow up to perform a pap smear and perhaps have gynecologic oncology referral for further management pending resolution of her infection.   4. If possible (it was not easy to tell based on the images), have interventional radiology assess ability to drain fluid collections in pelvis.  If not possible, she will likely see improvement with antibiotic treatment.  Please follow up with gynecology, if she does not.    Thank you for this interesting consult.  We will continue to follow along with her.  Please page [facility] MD on call per [facility] for questions.   Thomasene Mohair, MD 10/07/2018 9:32 PM

## 2018-10-07 NOTE — Progress Notes (Signed)
Inpatient Diabetes Program Recommendations  AACE/ADA: New Consensus Statement on Inpatient Glycemic Control   Target Ranges:  Prepandial:   less than 140 mg/dL      Peak postprandial:   less than 180 mg/dL (1-2 hours)      Critically ill patients:  140 - 180 mg/dL   Results for Julie Tran, Julie Tran (MRN [id number]) as of 10/07/2018 08:00  Ref. Range 10/06/2018 07:20 10/06/2018 11:58 10/06/2018 17:05 10/06/2018 21:47 10/07/2018 07:34  Glucose-Capillary Latest Ref Range: 70 - 99 mg/dL 299 (H) 400 (H) 474 (H) 216 (H) 418 (H)   Review of Glycemic Control  Diabetes history: DM2 Outpatient Diabetes medications: Metformin 1000 mg BID Current orders for Inpatient glycemic control: Lantus 30 units daily, Novolog 7 units TID with meals, Novolog 0-9 units TID with meals, Novolog 0-5 units QHS  Inpatient Diabetes Program Recommendations:   Insulin - Basal: Noted Lantus increased to 30 units daily today.  Insulin - Meal Coverage: Noted meal coverage increased to Novolog 7 units TID with meals today.  HgbA1C: A1C 12.9% on 10/05/18 indicating an average glucose of 324 mg/dl over the past 2-3 months. Noted A1C in Care Everywhere 12.5% on 06/29/18. Patient will need insulin as outpatient for DM control. May want to consider discharging on Humalog 75/25 insulin pens which Medication Management has on hand if they are able to assist with medication needs. Then patient could transition to Novolin 70/30 insulin pens at [facility] ($43 for box of 5 insulin pens) if needed.  NOTE: Noted patient received Prednisone 50 mg x3 on 10/06/18 which is contributing to hyperglycemia. No other steroids ordered at this time. Noted CM met with patient and she does NOT have prescription coverage. A request was sent to Medication Management by CM to see if they can assist with insulins at time of discharge. Patient stated she preferred to use an insulin pen for injections.  May want to consider discharging on Humalog 75/25 insulin pens which Medication  Management has on hand. Then patient could transition to Novolin 70/30 insulin pens from [place] for $43 per box of 5 insulin pens.  Thanks, Barnie Alderman, RN, MSN, CDE Diabetes Coordinator Inpatient Diabetes Program [phone] (Team Pager from 8am to 5pm)

## 2018-10-07 NOTE — Care Management Important Message (Signed)
Important Message  Patient Details  Name: Julie Tran MRN: [id number] Date of Birth: 12/23/1950   Medicare Important Message Given:  Yes    Olegario Messier A Quintina Hakeem 10/07/2018, 11:50 AM

## 2018-10-07 NOTE — Progress Notes (Signed)
Pt educated on self administration of insulin, pt able to verbalize and demonstrate proper administration

## 2018-10-07 NOTE — Progress Notes (Signed)
Interventional Radiology Progress Note  Asked by Dr. Enid Baas to review imaging to determine if any fluid collection can be percutaneously drained. By CT, the parauterine collections are small with the largest measuring only 1.3 cm in thickness along right posterior uterus. No dominant collection to drain and approach would currently be difficult and have to be transgluteal.  This is an unusual presentation at the patient's age and not typical of PID. Recommend follow up CT with contrast after appropriate antibiotic treatment, especially if patient is not improving clinically.  Julie Tran. Fredia Sorrow, M.D Pager:  [phone]

## 2018-10-07 NOTE — Progress Notes (Addendum)
[facility] [place] [facility]   PATIENT NAME: Julie BjorkJanie Tran    MR#:  [id number]  DATE OF BIRTH:  03/11/1951  SUBJECTIVE:  CHIEF COMPLAINT:   Chief Complaint  Patient presents with   Abdominal Pain   Urinary Tract Infection   No new complaint this morning.  No fevers overnight.  Still has some mild right lower quadrant abdominal pain.  Reviewed report of CT scan and consult for OB/GYN placed this morning.  REVIEW OF SYSTEMS:  Review of Systems  Constitutional: Negative for chills and fever.  HENT: Negative for hearing loss and tinnitus.   Eyes: Negative for blurred vision and double vision.  Respiratory: Negative for cough and shortness of breath.   Cardiovascular: Negative for chest pain and palpitations.  Gastrointestinal: Positive for abdominal pain. Negative for heartburn, nausea and vomiting.  Genitourinary: Negative for dysuria and urgency.  Musculoskeletal: Negative for myalgias and neck pain.  Skin: Negative for itching and rash.  Neurological: Negative for dizziness and headaches.  Psychiatric/Behavioral: Negative for depression and hallucinations.    DRUG ALLERGIES:   Allergies  Allergen Reactions   Ace Inhibitors Hives and Swelling    Had angioedema with lisinopril. [facility].  Had angioedema with lisinopril. [facility].     Contrast Media [Iodinated Diagnostic Agents]    Iodine Swelling   Latex Hives and Swelling   Penicillins Hives   Salicylates Hives   Shellfish Allergy Swelling   VITALS:  Blood pressure 127/70, pulse 86, temperature (!) 96.9 F (36.1 C), temperature source Axillary, resp. rate 18, height 5\' 3"  (1.6 m), weight 77.1 kg, SpO2 98 %. PHYSICAL EXAMINATION:  Physical Exam  GENERAL:  68 y.o.-year-old patient lying in the bed.  In distress due to pain EYES: Pupils equal, round, reactive to light and accommodation. No scleral icterus. Extraocular muscles intact.  HEENT:  Head atraumatic, normocephalic. Oropharynx and nasopharynx clear.  NECK:  Supple, no jugular venous distention. No thyroid enlargement, no tenderness.  LUNGS: Normal breath sounds bilaterally, no wheezing, rales, rhonchi. No use of accessory muscles of respiration.  CARDIOVASCULAR: S1, S2 normal. No murmurs, rubs, or gallops.  ABDOMEN: Soft, mild right lower quadrant tenderness.  No rebound or guarding.  Bowel sounds present. No organomegaly or mass.  Foley in place.  EXTREMITIES: No cyanosis, clubbing or edema b/l.    NEUROLOGIC: Cranial nerves II through XII are intact. No focal Motor or sensory deficits b/l.   PSYCHIATRIC: The patient is alert and oriented x 3.  SKIN: No obvious rash, lesion, or ulcer.   LABORATORY PANEL:  Female CBC Recent Labs  Lab 10/07/18 0640  WBC 15.1*  HGB 13.1  HCT 40.4  PLT 245   ------------------------------------------------------------------------------------------------------------------ Chemistries  Recent Labs  Lab 10/07/18 0512  NA 130*  K 4.7  CL 97*  CO2 22  GLUCOSE 539*  BUN 24*  CREATININE 0.59  CALCIUM 8.5*   RADIOLOGY:  Ct Abdomen Pelvis W Contrast  Result Date: 10/06/2018 CLINICAL DATA:  Diffuse abdominal pain, urinary frequency and dysuria. EXAM: CT ABDOMEN AND PELVIS WITH CONTRAST TECHNIQUE: Multidetector CT imaging of the abdomen and pelvis was performed using the standard protocol following bolus administration of intravenous contrast. CONTRAST:  100mL OMNIPAQUE IOHEXOL 300 MG/ML  SOLN COMPARISON:  None. FINDINGS: Lower chest: Unremarkable. Hepatobiliary: Diffuse low density of the liver relative to the spleen. Normal appearing gallbladder. Pancreas: Mild diffuse pancreatic atrophy. Spleen: Normal in size without focal abnormality. Adrenals/Urinary Tract:  Normal appearing adrenal glands. Bilateral renal cysts. Foley catheter in the urinary bladder with no significant urine in the bladder. The visualized portions of the ureters are  unremarkable. Stomach/Bowel: Prominent stool in the right, transverse and descending colon and proximal sigmoid colon. 1.4 cm oval fat density mass within a small bowel loop in the mid upper pelvis. The remainder of the small bowel has a normal appearance as do the stomach and appendix. Vascular/Lymphatic: Tortuous aorta. No enlarged lymph nodes. Reproductive: Enlarged uterus containing multiple densely calcified fibroids. Multiple fluid collections with rim enhancement surrounding the uterus, most pronounced superior to the uterus on the right. The ovaries are difficult to separate from these collections. Other: Mild presacral edema. Musculoskeletal: Lumbar and lower thoracic spine degenerative changes. IMPRESSION: 1. Multiple pelvic fluid collections with surrounding rim enhancement surrounding the uterus, suspicious for abscesses, most pronounced superior to the uterus on the right. 2. 1.4 cm lipoma within a small bowel loop in the mid upper pelvis. 3. Diffuse hepatic steatosis. 4. Prominent stool in the colon. Electronically Signed   By: Beckie Salts M.D.   On: 10/06/2018 21:38   ASSESSMENT AND PLAN:    1.UTI with urinary retention Patient currently on IV antibiotics with Ancef.  Urine culture with E. coli sensitive to cefazolin. Has issues previously with urinary retention Foley catheter placed due to urinary retention. Will need foley at discharge and urology f/u.  2.  Abscess around the uterus. Due to persistence of abdominal pains yesterday.  Patient had CT abdomen and pelvis done which reveale multiple pelvic fluid collections with surrounding rim enhancement surrounding the uterus, suspicious for abscesses, most pronounced superior to the uterus on the right. I discussed case with OB/GYN physician Dr. Jean Rosenthal who saw patient and recommended changing Abx from ancef to IV Cefotetan 2g Q12hr and Doxycycline 100mg  Q12hr. Also discussed with IR regarding possible drainage. Dr Fredia Sorrow stated the  abcsesses are very small for drainage and recommended continuing antibiotics and to repeat CT with IV contrast for follow up in a few days especially if pt is not improving  3.Uncontrolled DM with hyperglycemia Blood sugars uncontrolled this morning greater than 500.  Patient not in DKA.. A1c 12.9.  Increased Lantus insulin from 22 to 30 units.  Increased pre-meal NovoLog insulin from 3 to 7 units 3 times daily with meals.  Continue sliding scale insulin coverage.   IV fluid hydration monitor.  4.HTN (hypertension) Blood pressure controlled on current regimen  5. Asthma -home dose inhalers  DVT prophylaxis; Resumed Lovenox since no plans for surgery  SCDs for now  All the records are reviewed and case discussed with Care Management/Social Worker. Management plans discussed with the patient, family and they are in agreement.  CODE STATUS: Full Code  TOTAL TIME TAKING CARE OF THIS PATIENT: 37 minutes.   More than 50% of the time was spent in counseling/coordination of care: YES  POSSIBLE D/C IN 2 DAYS, DEPENDING ON CLINICAL CONDITION.   Kaneshia Cater M.D on 10/07/2018 at 10:46 AM  Between 7am to 6pm - Pager - [phone]  After 6pm go to [email or web address] - Social research officer, government  [facility] [facility]  Office  [phone]  CC: Primary care physician; System, Pcp Not In  Note: This dictation was prepared with Dragon dictation along with smaller phrase technology. Any transcriptional errors that result from this process are unintentional.

## 2018-10-08 LAB — BASIC METABOLIC PANEL
Anion gap: 9 (ref 5–15)
BUN: 18 mg/dL (ref 8–23)
CO2: 24 mmol/L (ref 22–32)
Calcium: 8.2 mg/dL — ABNORMAL LOW (ref 8.9–10.3)
Chloride: 106 mmol/L (ref 98–111)
Creatinine, Ser: 0.55 mg/dL (ref 0.44–1.00)
GFR calc Af Amer: >60 mL/min (ref >60)
GFR calc non Af Amer: >60 mL/min (ref >60)
Glucose, Bld: 234 mg/dL — ABNORMAL HIGH (ref 70–99)
Potassium: 3.3 mmol/L — ABNORMAL LOW (ref 3.5–5.1)
Sodium: 139 mmol/L (ref 135–145)

## 2018-10-08 LAB — CBC
HCT: 38.6 % (ref 36.0–46.0)
Hemoglobin: 12.4 g/dL (ref 12.0–15.0)
MCH: 29.8 pg (ref 26.0–34.0)
MCHC: 32.1 g/dL (ref 30.0–36.0)
MCV: 92.8 fL (ref 80.0–100.0)
Platelets: 260 10*3/uL (ref 150–400)
RBC: 4.16 MIL/uL (ref 3.87–5.11)
RDW: 14.2 % (ref 11.5–15.5)
WBC: 12.1 10*3/uL — ABNORMAL HIGH (ref 4.0–10.5)
nRBC: 0 % (ref 0.0–0.2)

## 2018-10-08 LAB — GLUCOSE, CAPILLARY
Glucose-Capillary: 239 mg/dL — ABNORMAL HIGH (ref 70–99)
Glucose-Capillary: 273 mg/dL — ABNORMAL HIGH (ref 70–99)
Glucose-Capillary: 205 mg/dL — ABNORMAL HIGH (ref 70–99)
Glucose-Capillary: 164 mg/dL — ABNORMAL HIGH (ref 70–99)

## 2018-10-08 LAB — MAGNESIUM: Magnesium: 2.4 mg/dL (ref 1.7–2.4)

## 2018-10-08 MED ORDER — POTASSIUM CHLORIDE CRYS ER 20 MEQ PO TBCR
40.0000 meq | EXTENDED_RELEASE_TABLET | Freq: Once | ORAL | Status: AC
  Administered 2018-10-08: 10:00:00 40 meq via ORAL
  Filled 2018-10-08: qty 2

## 2018-10-08 MED ORDER — INSULIN ASPART 100 UNIT/ML ~~LOC~~ SOLN
12.0000 [IU] | Freq: Three times a day (TID) | SUBCUTANEOUS | Status: DC
  Administered 2018-10-08 (×2): 12 [IU] via SUBCUTANEOUS
  Filled 2018-10-08 (×2): qty 1

## 2018-10-08 MED ORDER — INSULIN GLARGINE 100 UNIT/ML ~~LOC~~ SOLN
40.0000 [IU] | Freq: Every day | SUBCUTANEOUS | Status: DC
  Administered 2018-10-08: 10:00:00 40 [IU] via SUBCUTANEOUS
  Filled 2018-10-08 (×2): qty 0.4

## 2018-10-08 NOTE — Progress Notes (Signed)
[employer] [facility] -  at [facility]   PATIENT NAME: Julie Tran    MR#:  [id number]  DATE OF BIRTH:  05-10-1950  SUBJECTIVE:  CHIEF COMPLAINT:   Chief Complaint  Patient presents with  . Abdominal Pain  . Urinary Tract Infection   No new complaint this morning.  No fevers overnight.  Still has some mild right lower quadrant abdominal pain.   REVIEW OF SYSTEMS:  Review of Systems  Constitutional: Negative for chills and fever.  HENT: Negative for hearing loss and tinnitus.   Eyes: Negative for blurred vision and double vision.  Respiratory: Negative for cough and shortness of breath.   Cardiovascular: Negative for chest pain and palpitations.  Gastrointestinal: Positive for abdominal pain. Negative for heartburn, nausea and vomiting.  Genitourinary: Negative for dysuria and urgency.  Musculoskeletal: Negative for myalgias and neck pain.  Skin: Negative for itching and rash.  Neurological: Negative for dizziness and headaches.  Psychiatric/Behavioral: Negative for depression and hallucinations.    DRUG ALLERGIES:   Allergies  Allergen Reactions  . Ace Inhibitors Hives and Swelling    Had angioedema with lisinopril. Seen in [facility] ED for this.  Had angioedema with lisinopril. Seen in [facility] ED for this.    . Contrast Media [Iodinated Diagnostic Agents]   . Iodine Swelling  . Latex Hives and Swelling  . Penicillins Hives  . Salicylates Hives  . Shellfish Allergy Swelling   VITALS:  Blood pressure (!) 144/71, pulse 85, temperature 98.1 F (36.7 C), temperature source Oral, resp. rate 16, height 5\' 3"  (1.6 m), weight 77.1 kg, SpO2 97 %. PHYSICAL EXAMINATION:  Physical Exam  GENERAL:  68 y.o.-year-old patient lying in the bed.  In distress due to pain EYES: Pupils equal, round, reactive to light and accommodation. No scleral icterus. Extraocular muscles intact.  HEENT: Head atraumatic, normocephalic. Oropharynx and nasopharynx clear.  NECK:   Supple, no jugular venous distention. No thyroid enlargement, no tenderness.  LUNGS: Normal breath sounds bilaterally, no wheezing, rales, rhonchi. No use of accessory muscles of respiration.  CARDIOVASCULAR: S1, S2 normal. No murmurs, rubs, or gallops.  ABDOMEN: Soft, mild right lower quadrant tenderness.  No rebound or guarding.  Bowel sounds present. No organomegaly or mass.  Foley in place.  EXTREMITIES: No cyanosis, clubbing or edema b/l.    NEUROLOGIC: Cranial nerves II through XII are intact. No focal Motor or sensory deficits b/l.   PSYCHIATRIC: The patient is alert and oriented x 3.  SKIN: No obvious rash, lesion, or ulcer.   LABORATORY PANEL:  Female CBC Recent Labs  Lab 10/08/18 0543  WBC 12.1*  HGB 12.4  HCT 38.6  PLT 260   ------------------------------------------------------------------------------------------------------------------ Chemistries  Recent Labs  Lab 10/08/18 0543  NA 139  K 3.3*  CL 106  CO2 24  GLUCOSE 234*  BUN 18  CREATININE 0.55  CALCIUM 8.2*  MG 2.4   RADIOLOGY:  No results found. ASSESSMENT AND PLAN:    1.UTI with urinary retention Patient currently on IV antibiotics with Ancef.  Urine culture with E. coli sensitive to cefazolin. Has issues previously with urinary retention Foley catheter placed due to urinary retention. Will need foley at discharge and urology f/u.  2.  Abscess around the uterus. Due to persistence of abdominal pains on 10/06/2018.  Patient had CT abdomen and pelvis done which reveale multiple pelvic fluid collections with surrounding rim enhancement surrounding the uterus, suspicious for abscesses, most pronounced superior to the uterus on the right. I discussed  case with OB/GYN physician Dr. Jean RosenthalJackson who saw patient and recommended changing Abx from ancef to IV Cefotetan 2g Q12hr and Doxycycline 100mg  Q12hr. Also discussed with IR regarding possible drainage. Dr Fredia SorrowYamagata stated the abcsesses are very small for  drainage and recommended continuing antibiotics and to repeat CT with IV contrast for follow up in 2 days especially if pt is not improving  3.Uncontrolled DM with hyperglycemia Blood sugars uncontrolled this morning greater than 500.  Patient not in DKA.. A1c 12.9.  Increased Lantus insulin from 30 to 40 units.  Increased pre-meal NovoLog insulin from 7 to 12 units 3 times daily with meals.  Continue sliding scale insulin coverage.   IV fluid hydration monitor. Reviewed notes from diabetic nurse coordinator.  Recommend considering Humalog 75/25 on discharge due to insurance coverage.  4.HTN (hypertension) Blood pressure controlled on current regimen  5. Asthma -home dose inhalers  DVT prophylaxis; Resumed Lovenox since no plans for surgery  SCDs for now  All the records are reviewed and case discussed with Care Management/Social Worker. Management plans discussed with the patient, family and they are in agreement.  CODE STATUS: Full Code  TOTAL TIME TAKING CARE OF THIS PATIENT: 36 minutes.   More than 50% of the time was spent in counseling/coordination of care: YES  POSSIBLE D/C IN 3 DAYS, DEPENDING ON CLINICAL CONDITION.   Gorden Stthomas M.D on 10/08/2018 at 1:54 PM  Between 7am to 6pm - Pager - [phone]  After 6pm go to [email or web address] - Social research officer, governmentpassword [email or web address] [email or web address]  [facility] [place] [facility]  Office  [phone]  CC: Primary care physician; System, Pcp Not In  Note: This dictation was prepared with Dragon dictation along with smaller phrase technology. Any transcriptional errors that result from this process are unintentional.

## 2018-10-08 NOTE — Progress Notes (Signed)
Inpatient Diabetes Program Recommendations  AACE/ADA: New Consensus Statement on Inpatient Glycemic Control   Target Ranges:  Prepandial:   less than 140 mg/dL      Peak postprandial:   less than 180 mg/dL (1-2 hours)      Critically ill patients:  140 - 180 mg/dL   Results for Julie Tran, Julie Tran (MRN [id number]) as of 10/08/2018 07:54  Ref. Range 10/07/2018 07:34 10/07/2018 12:04 10/07/2018 16:54 10/07/2018 21:56 10/08/2018 07:47  Glucose-Capillary Latest Ref Range: 70 - 99 mg/dL 418 (H) 258 (H) 337 (H) 352 (H) 239 (H)   Review of Glycemic Control  Diabetes history: DM2 Outpatient Diabetes medications: Metformin 1000 mg BID Current orders for Inpatient glycemic control: Lantus 30 units daily,Novolog 7 units TID with meals, Novolog 0-9 units TID with meals, Novolog 0-5 units QHS  Inpatient Diabetes Program Recommendations:   Insulin - Basal: Please consider increasing Lantus to 40 units dialy.  Insulin - Meal Coverage: Please consider increasing meal coverage increased to Novolog 12 units TID with meals today.  HgbA1C: A1C 12.9% on 10/05/18 indicating an average glucose of 324 mg/dl over the past 2-3 months. Noted A1C in Care Everywhere 12.5% on 06/29/18. Patient will need insulin as outpatient for DM control. May want to consider discharging on Humalog 75/25 insulin pens which Medication Management has on hand if they are able to assist with medication needs. Then patient could transition to Novolin 70/30 insulin pens at [employer] ($43 for box of 5 insulin pens) if needed.  NOTE: Noted patient received Prednisone 50 mg x3 on 10/06/18 which is contributing to hyperglycemia. No other steroids ordered at this time. Noted CM met with patient and she does NOT have prescription coverage. A request was sent to Medication Management by CM to see if they can assist with insulins at time of discharge. Patient stated she preferred to use an insulin pen for injections.  May want to consider discharging on Humalog  75/25 insulin pens which Medication Management has on hand. Then patient could transition to Novolin 70/30 insulin pens from [employer] for $43 per box of 5 insulin pens.  Thanks, Barnie Alderman, RN, MSN, CDE Diabetes Coordinator Inpatient Diabetes Program [phone] (Team Pager from 8am to 5pm)

## 2018-10-09 ENCOUNTER — Telehealth: Payer: Self-pay | Admitting: Pharmacy Technician

## 2018-10-09 LAB — BASIC METABOLIC PANEL
Anion gap: 9 (ref 5–15)
BUN: 7 mg/dL — ABNORMAL LOW (ref 8–23)
CO2: 26 mmol/L (ref 22–32)
Calcium: 8.3 mg/dL — ABNORMAL LOW (ref 8.9–10.3)
Chloride: 106 mmol/L (ref 98–111)
Creatinine, Ser: 0.42 mg/dL — ABNORMAL LOW (ref 0.44–1.00)
GFR calc Af Amer: >60 mL/min (ref >60)
GFR calc non Af Amer: >60 mL/min (ref >60)
Glucose, Bld: 111 mg/dL — ABNORMAL HIGH (ref 70–99)
Potassium: 3.1 mmol/L — ABNORMAL LOW (ref 3.5–5.1)
Sodium: 141 mmol/L (ref 135–145)

## 2018-10-09 LAB — CBC
HCT: 40.3 % (ref 36.0–46.0)
Hemoglobin: 13.1 g/dL (ref 12.0–15.0)
MCH: 29.2 pg (ref 26.0–34.0)
MCHC: 32.5 g/dL (ref 30.0–36.0)
MCV: 89.8 fL (ref 80.0–100.0)
Platelets: 288 10*3/uL (ref 150–400)
RBC: 4.49 MIL/uL (ref 3.87–5.11)
RDW: 14 % (ref 11.5–15.5)
WBC: 10.2 10*3/uL (ref 4.0–10.5)
nRBC: 0 % (ref 0.0–0.2)

## 2018-10-09 LAB — GLUCOSE, CAPILLARY
Glucose-Capillary: 105 mg/dL — ABNORMAL HIGH (ref 70–99)
Glucose-Capillary: 162 mg/dL — ABNORMAL HIGH (ref 70–99)
Glucose-Capillary: 171 mg/dL — ABNORMAL HIGH (ref 70–99)
Glucose-Capillary: 112 mg/dL — ABNORMAL HIGH (ref 70–99)

## 2018-10-09 LAB — MAGNESIUM: Magnesium: 2 mg/dL (ref 1.7–2.4)

## 2018-10-09 MED ORDER — INSULIN ASPART PROT & ASPART (70-30 MIX) 100 UNIT/ML ~~LOC~~ SUSP
28.0000 [IU] | Freq: Two times a day (BID) | SUBCUTANEOUS | Status: DC
  Administered 2018-10-09 (×2): 28 [IU] via SUBCUTANEOUS
  Filled 2018-10-09 (×2): qty 10

## 2018-10-09 MED ORDER — DOXYCYCLINE HYCLATE 100 MG PO TABS
100.0000 mg | ORAL_TABLET | Freq: Two times a day (BID) | ORAL | Status: DC
  Administered 2018-10-09 – 2018-10-12 (×7): 100 mg via ORAL
  Filled 2018-10-09 (×8): qty 1

## 2018-10-09 MED ORDER — ALUM & MAG HYDROXIDE-SIMETH 200-200-20 MG/5ML PO SUSP
30.0000 mL | ORAL | Status: DC | PRN
  Administered 2018-10-09 – 2018-10-12 (×4): 30 mL via ORAL
  Filled 2018-10-09 (×4): qty 30

## 2018-10-09 MED ORDER — SODIUM CHLORIDE 0.9 % IV SOLN
2.0000 g | Freq: Two times a day (BID) | INTRAVENOUS | Status: DC
  Administered 2018-10-09 – 2018-10-11 (×6): 2 g via INTRAVENOUS
  Filled 2018-10-09 (×9): qty 2

## 2018-10-09 MED ORDER — POTASSIUM CHLORIDE CRYS ER 20 MEQ PO TBCR
40.0000 meq | EXTENDED_RELEASE_TABLET | Freq: Once | ORAL | Status: AC
  Administered 2018-10-09: 40 meq via ORAL
  Filled 2018-10-09: qty 2

## 2018-10-09 NOTE — Evaluation (Signed)
Physical Therapy Evaluation Patient Details Name: Julie Tran MRN: [id number] DOB: 19-Oct-1950 Today's Date: 10/09/2018   History of Present Illness  68 y/o female here with a complaint of urinary frequency and dysuria.  She is found here to have nitrite positive UA strongly suspicious for UTI.  She was also in very mild DKA.  Clinical Impression  Pt was able to ambulate into the hallway and showed good confidence with walker once she agreed to use it, very firm on trying to walk with QC but needing contralateral hand on rail, counter, wall, etc the entire effort.  Pt refused to try steps despite walking to stairwell, likely she could manage the 3 to get into the home with QC and rail w/o issue.  Pt wanted to get to recliner, then didn't, then ultimately did agree to remain sitting post session as she has "not been up in days."  Pt could benefit from further PT at home if she is interested.    Follow Up Recommendations Home health PT   Equipment Recommendations      Recommendations for Other Services       Precautions / Restrictions Precautions Precautions: Fall Restrictions Weight Bearing Restrictions: No      Mobility  Bed Mobility Overal bed mobility: Independent             General bed mobility comments: Pt was able to get to sitting at EOB w/o assist  Transfers Overall transfer level: Modified independent Equipment used: Quad cane             General transfer comment: Pt was able to rise to standing and maintain balance relatively well, though she needed R hand on rail.  Ambulation/Gait Ambulation/Gait assistance: Min guard Gait Distance (Feet): 100 Feet Assistive device: Quad cane;Rolling walker (2 wheeled)       General Gait Details: Pt initially wanting to use QC but unquestionably required R UE on something to maintian balance, despite cues to use walker she persistently resisted, finally with cuing and explaination she agreed to walker and instantly had  markedly better cadence, speed and safety.    Stairs            Wheelchair Mobility    Modified Rankin (Stroke Patients Only)       Balance Overall balance assessment: Modified Independent(needs b/l UE assist to maintain standing act balance)                                           Pertinent Vitals/Pain Pain Location: Pt c/o R flank/hip pain and L foot neuropathy pain, does not rate    Home Living Family/patient expects to be discharged to:: Private residence Living Arrangements: Alone Available Help at Discharge: Family   Home Access: Stairs to enter   Entrance Stairs-Number of Steps: 3   Home Equipment: Environmental consultantWalker - 2 wheels;Cane - single point      Prior Function Level of Independence: Independent with assistive device(s)         Comments: Pt reports that she uses walker most of the time, but that she tries to use cane but then needs wall, etc     Hand Dominance        Extremity/Trunk Assessment   Upper Extremity Assessment Upper Extremity Assessment: Generalized weakness;Overall [facility] for tasks assessed    Lower Extremity Assessment Lower Extremity Assessment: Generalized weakness;[facility] for tasks assessed  Communication   Communication: No difficulties  Cognition Arousal/Alertness: Awake/alert Behavior During Therapy: WFL for tasks assessed/performed Overall Cognitive Status: Within Functional Limits for tasks assessed                                 General Comments: Pt with penchant for contrarian response to most interactions      General Comments      Exercises     Assessment/Plan    PT Assessment Patient needs continued PT services  PT Problem List Decreased strength;Decreased range of motion;Decreased activity tolerance;Decreased balance;Decreased mobility;Decreased coordination;Decreased knowledge of use of DME;Decreased safety awareness;Decreased knowledge of precautions;Pain       PT  Treatment Interventions DME instruction;Gait training;Stair training;Functional mobility training;Balance training;Therapeutic exercise;Therapeutic activities;Neuromuscular re-education;Patient/family education    PT Goals (Current goals can be found in the Care Plan section)  Acute Rehab PT Goals Patient Stated Goal: go home PT Goal Formulation: With patient Time For Goal Achievement: 10/23/18 Potential to Achieve Goals: Good    Frequency Min 2X/week   Barriers to discharge        Co-evaluation               AM-PAC PT "6 Clicks" Mobility  Outcome Measure Help needed turning from your back to your side while in a flat bed without using bedrails?: None Help needed moving from lying on your back to sitting on the side of a flat bed without using bedrails?: None Help needed moving to and from a bed to a chair (including a wheelchair)?: None Help needed standing up from a chair using your arms (e.g., wheelchair or bedside chair)?: None Help needed to walk in hospital room?: A Little Help needed climbing 3-5 steps with a railing? : A Little 6 Click Score: 22    End of Session Equipment Utilized During Treatment: Gait belt Activity Tolerance: Patient tolerated treatment well Patient left: with bed alarm set Nurse Communication: Mobility status PT Visit Diagnosis: Muscle weakness (generalized) (M62.81);Difficulty in walking, not elsewhere classified (R26.2);Unsteadiness on feet (R26.81)    Time: 2440-1027 PT Time Calculation (min) (ACUTE ONLY): 27 min   Charges:   PT Evaluation $PT Eval Low Complexity: 1 Low PT Treatments $Gait Training: 8-22 mins        Malachi Pro, DPT 10/09/2018, 4:41 PM

## 2018-10-09 NOTE — Progress Notes (Addendum)
Inpatient Diabetes Program Recommendations  AACE/ADA: New Consensus Statement on Inpatient Glycemic Control  Target Ranges:  Prepandial:   less than 140 mg/dL      Peak postprandial:   less than 180 mg/dL (1-2 hours)      Critically ill patients:  140 - 180 mg/dL   Results for Tran, Julie (MRN [id number]) as of 10/09/2018 08:41  Ref. Range 10/08/2018 07:47 10/08/2018 11:31 10/08/2018 16:47 10/08/2018 21:11 10/09/2018 08:21  Glucose-Capillary Latest Ref Range: 70 - 99 mg/dL 239 (H) 273 (H) 205 (H) 164 (H) 105 (H)   Review of Glycemic Control  Diabetes history:DM2 Outpatient Diabetes medications:Metformin 1000 mg BID Current orders for Inpatient glycemic control:Lantus40units daily,Novolog 12 units TID with meals,Novolog 0-9 units TID with meals, Novolog 0-5 units QHS  Inpatient Diabetes Program Recommendations:  Insulin- Since patient will need an affordable insulin, recommend transitioning patient to 70/30 while inpatient. If MD agreeable, please discontinue Lantus 40 units daily, Novolog 12 units TID meal coverage, and order 70/30 28 units BID (starting now; to be given with breakfast and supper).  HgbA1C: A1C 12.9% on 10/05/18 indicating an average glucose of 324 mg/dl over the past 2-3 months. Noted A1C in Care Everywhere 12.5% on 06/29/18. Patientwill need insulin asoutpatient for DM control. Recommend discharging patient on Novolin 70/30 insulin pens which can be purchased at [facility] for $43 for box of 5 insulin pens.  NOTE: Noted patient received Prednisone 50 mg x3 on 10/06/18 which is contributing to hyperglycemia. No other steroids ordered at this time. Noted CM met with patient and she does NOT have prescription coverage. A request was sent to Medication Management by CM to see if they can assist with insulins at time of discharge but it does not look like patient will be able to get assistance from [facility]. Patient stated she preferred to use an insulin penfor  injections. Therefore, recommend discharging patient on Novolin 70/30 insulin pens from [facility] for $43 per box of 5 insulin pens.  Addendum 10/09/18@12:10- Met with patient again regarding insulin and affordable insulin options. Patient states that she prefers to use insulin pens and understands that she will need to take insulin for DM.  Informed patient she is being transitioned to 70/30 insulin today.  Patient states that the nurse told her that earlier but she does not know what that means. Discussed 70/30 insulin in detail and how it works.  Informed patient that NOVOLIN 70/30 can be purchased at [facility] for $43 per box or $25 per vial. Patient states that the price is reasonable and she would work it out.  Provided handout on [employer]On products at [employer]. Encouraged patient to purchase Reli-On Premier Classic glucometer for $9 and a box of 50 test strips for $9 since it is an affordable glucometer.  Also informed patient she can purchase insulin pen needles at [facility] for $9 (for box of 50 needles).  Reviewed how to use an insulin pen with patient again and reminded patient she has step by step instructions in the insulin starter kit she has at bedside. Patient states that the insulin sounds much more affordable for her than Lantus and Novolog she was prescribed in the past.  Patient verbalized understanding of information discussed and she states that she has no questions at this time.   Thanks, Marie Byrd, RN, MSN, CDE Diabetes Coordinator Inpatient Diabetes Program [phone] (Team Pager from 8am to 5pm)    

## 2018-10-09 NOTE — Telephone Encounter (Signed)
Attempted to contact patient to discuss eligibility criteria for [facility].  Unable to reach patient.  Received a voice message indicating that patient has a voice mailbox that has not been set-up.  Beryl Meager reached out to [employer] to find out the best way to contact patient.  Sherilyn Dacosta Care Manager [facility]

## 2018-10-09 NOTE — TOC Progression Note (Addendum)
Transition of Care [facility]) - Progression Note    Patient Details  Name: Julie Tran MRN: [id number] Date of Birth: November 04, 1950  [facility] CM/SW Contact  Collie Siad, RN Phone Number: 10/09/2018, 7:52 AM  Clinical Narrative:    Follow up message left for Betty at Medication Management to see if she was able to connect with patient for financial details related to process for medication assistance.  Patient has been provided Good Rx vouchers however the cost of the Novolog and Lantus are still over $100 each.  Patient advised to seek medication coverage and she responded that "I have had it and I can't afford it".  It is difficult for medication management to be able to assist patient if she cannot or will not provide financial information.  There may not be any other options for this this patient.  Update at 1500: RNCM received notification from Pender with Medication Management that she spoke with patient by phone. Patient told her that she did not want to talk with her regarding financial information. There are not other options at this time for patient as she will not participate in treatment plan progress.  Please assess patient for need for psych evaluation as patient seems somewhat paranoid although she assures that "she is safe to discharge to home" wherever that may be because she refused to share that with me. Update 1652: Medication Management reached back out to me stating that they would be able to provide 30-day supply however an RX is needed for medications MD notified. Medication Management is closed for the weekend. MD updated. RNCM may need to use MATCH assistance at time of weekend discharge.  Home health can only be arranged if patient shares current address and contact number. MD notified. Patient is pending more studies per MD and unsure of what medications she will need at this time.    .  Expected Discharge Plan: Home/Self Care    Expected Discharge Plan and  Services Expected Discharge Plan: Home/Self Care         Expected Discharge Date: 10/10/18                                     Social Determinants of Health (SDOH) Interventions    Readmission Risk Interventions No flowsheet data found.

## 2018-10-09 NOTE — Progress Notes (Signed)
[employer] [facility] [facility] [place] at [facility]   PATIENT NAME: Ardine BjorkJanie Hooley    MR#:  [id number]  DATE OF BIRTH:  31-Mar-1951  SUBJECTIVE:  CHIEF COMPLAINT:   Chief Complaint  Patient presents with   Abdominal Pain   Urinary Tract Infection   No new complaint this morning.  No fevers overnight.  Patient reports improvement in abdominal pains. No nausea vomiting. REVIEW OF SYSTEMS:  Review of Systems  Constitutional: Negative for chills and fever.  HENT: Negative for hearing loss and tinnitus.   Eyes: Negative for blurred vision and double vision.  Respiratory: Negative for cough and shortness of breath.   Cardiovascular: Negative for chest pain and palpitations.  Gastrointestinal: Positive for abdominal pain. Negative for heartburn, nausea and vomiting.  Genitourinary: Negative for dysuria and urgency.  Musculoskeletal: Negative for myalgias and neck pain.  Skin: Negative for itching and rash.  Neurological: Negative for dizziness and headaches.  Psychiatric/Behavioral: Negative for depression and hallucinations.    DRUG ALLERGIES:   Allergies  Allergen Reactions   Ace Inhibitors Hives and Swelling    Had angioedema with lisinopril. [facility].  Had angioedema with lisinopril. [facility].     Contrast Media [Iodinated Diagnostic Agents]    Iodine Swelling   Latex Hives and Swelling   Penicillins Hives   Salicylates Hives   Shellfish Allergy Swelling   VITALS:  Blood pressure (!) 146/89, pulse 87, temperature 98.3 F (36.8 C), temperature source Oral, resp. rate 20, height 5\' 3"  (1.6 m), weight 77.1 kg, SpO2 97 %. PHYSICAL EXAMINATION:  Physical Exam  GENERAL:  68 y.o.-year-old patient lying in the bed.  In distress due to pain EYES: Pupils equal, round, reactive to light and accommodation. No scleral icterus. Extraocular muscles intact.  HEENT: Head atraumatic, normocephalic. Oropharynx and nasopharynx clear.    NECK:  Supple, no jugular venous distention. No thyroid enlargement, no tenderness.  LUNGS: Normal breath sounds bilaterally, no wheezing, rales, rhonchi. No use of accessory muscles of respiration.  CARDIOVASCULAR: S1, S2 normal. No murmurs, rubs, or gallops.  ABDOMEN: Soft, mild right lower quadrant tenderness.  No rebound or guarding.  Bowel sounds present. No organomegaly or mass.  Foley in place.  EXTREMITIES: No cyanosis, clubbing or edema b/l.    NEUROLOGIC: Cranial nerves II through XII are intact. No focal Motor or sensory deficits b/l.   PSYCHIATRIC: The patient is alert and oriented x 3.  SKIN: No obvious rash, lesion, or ulcer.   LABORATORY PANEL:  Female CBC Recent Labs  Lab 10/09/18 0607  WBC 10.2  HGB 13.1  HCT 40.3  PLT 288   ------------------------------------------------------------------------------------------------------------------ Chemistries  Recent Labs  Lab 10/09/18 0607  NA 141  K 3.1*  CL 106  CO2 26  GLUCOSE 111*  BUN 7*  CREATININE 0.42*  CALCIUM 8.3*  MG 2.0   RADIOLOGY:  No results found. ASSESSMENT AND PLAN:    1.UTI with urinary retention Patient currently on IV antibiotics with Ancef.  Urine culture with E. coli sensitive to cefazolin.  Patient currently on Cefotan which is also being used for treatment of intra-abdominal infection. Has issues previously with urinary retention Foley catheter placed due to urinary retention. Will need foley at discharge and urology f/u.  2.  Abscess around the uterus. Due to persistence of abdominal pains on 10/06/2018.  Patient had CT abdomen and pelvis done which reveale multiple pelvic fluid collections with surrounding rim enhancement surrounding the uterus,  suspicious for abscesses, most pronounced superior to the uterus on the right. I discussed case with OB/GYN physician Dr. Jean Rosenthal previously who saw patient and recommended changing Abx from ancef to IV Cefotetan 2g Q12hr and Doxycycline  100mg  Q12hr. patient will require a total of at least 2 weeks of antibiotics. Also discussed with IR regarding possible drainage. Dr Fredia Sorrow stated the abcsesses are very small for drainage and recommended continuing antibiotics and to repeat CT with IV contrast for follow tomorrow. If repeat scan is improved continues to improve clinically, patient may be transitioned to p.o. antibiotics to complete total of 2 weeks and follow-up with [facility] physician Dr. Jean Rosenthal as outpatient Consult for physical therapy to evaluate and treat today.  3.Uncontrolled DM with hyperglycemia Blood sugars uncontrolled recently due to ongoing infectious process.   A1c 12.9.  Patient was being managed with Lantus insulin and pre-meal NovoLog insulin.  This has been changed to NovoLog 70/30 effective from today which will be affordable for patient was discharged to facilitate compliance with meds.  4.HTN (hypertension) Blood pressure fairly controlled on current regimen  5. Asthma -home dose inhalers  6.  Hypokalemia; replaced  DVT prophylaxis; Lovenox  All the records are reviewed and case discussed with Care Management/Social Worker. Management plans discussed with the patient, family and they are in agreement.  CODE STATUS: Full Code  TOTAL TIME TAKING CARE OF THIS PATIENT: 37 minutes.   More than 50% of the time was spent in counseling/coordination of care: YES  POSSIBLE D/C IN 3 DAYS, DEPENDING ON CLINICAL CONDITION.   Raquon Milledge M.D on 10/09/2018 at 11:15 AM  Between 7am to 6pm - Pager - [phone]  After 6pm go to [email or web address] - Social research officer, government  [facility] [place] [facility]  Office  [phone]  CC: Primary care physician; System, Pcp Not In  Note: This dictation was prepared with Dragon dictation along with smaller phrase technology. Any transcriptional errors that result from this process are unintentional.

## 2018-10-09 NOTE — Telephone Encounter (Signed)
Spoke with patient in room.  Patient stated that she was trying to rest and asked that I call her back after 1:00p.m.  Julie Tran Care Manager [facility]

## 2018-10-09 NOTE — Care Management Important Message (Signed)
Important Message  Patient Details  Name: Julie Tran MRN: [id number] Date of Birth: 11-02-1950   Medicare Important Message Given:  Yes    Olegario Messier A Marrell Dicaprio 10/09/2018, 11:15 AM

## 2018-10-10 ENCOUNTER — Inpatient Hospital Stay: Payer: Medicare Other

## 2018-10-10 ENCOUNTER — Encounter: Payer: Self-pay | Admitting: Radiology

## 2018-10-10 LAB — BASIC METABOLIC PANEL
Anion gap: 7 (ref 5–15)
BUN: 7 mg/dL — ABNORMAL LOW (ref 8–23)
CO2: 28 mmol/L (ref 22–32)
Calcium: 8.4 mg/dL — ABNORMAL LOW (ref 8.9–10.3)
Chloride: 104 mmol/L (ref 98–111)
Creatinine, Ser: 0.49 mg/dL (ref 0.44–1.00)
GFR calc Af Amer: >60 mL/min (ref >60)
GFR calc non Af Amer: >60 mL/min (ref >60)
Glucose, Bld: 74 mg/dL (ref 70–99)
Potassium: 3.2 mmol/L — ABNORMAL LOW (ref 3.5–5.1)
Sodium: 139 mmol/L (ref 135–145)

## 2018-10-10 LAB — CBC
HCT: 39.4 % (ref 36.0–46.0)
Hemoglobin: 12.9 g/dL (ref 12.0–15.0)
MCH: 29.3 pg (ref 26.0–34.0)
MCHC: 32.7 g/dL (ref 30.0–36.0)
MCV: 89.5 fL (ref 80.0–100.0)
Platelets: 295 10*3/uL (ref 150–400)
RBC: 4.4 MIL/uL (ref 3.87–5.11)
RDW: 14.2 % (ref 11.5–15.5)
WBC: 12.9 10*3/uL — ABNORMAL HIGH (ref 4.0–10.5)
nRBC: 0 % (ref 0.0–0.2)

## 2018-10-10 LAB — GLUCOSE, CAPILLARY
Glucose-Capillary: 179 mg/dL — ABNORMAL HIGH (ref 70–99)
Glucose-Capillary: 59 mg/dL — ABNORMAL LOW (ref 70–99)
Glucose-Capillary: 209 mg/dL — ABNORMAL HIGH (ref 70–99)
Glucose-Capillary: 264 mg/dL — ABNORMAL HIGH (ref 70–99)
Glucose-Capillary: 354 mg/dL — ABNORMAL HIGH (ref 70–99)

## 2018-10-10 LAB — MAGNESIUM: Magnesium: 2.2 mg/dL (ref 1.7–2.4)

## 2018-10-10 MED ORDER — IOHEXOL 300 MG/ML  SOLN
100.0000 mL | Freq: Once | INTRAMUSCULAR | Status: AC | PRN
  Administered 2018-10-10: 100 mL via INTRAVENOUS

## 2018-10-10 MED ORDER — INSULIN ASPART PROT & ASPART (70-30 MIX) 100 UNIT/ML ~~LOC~~ SUSP
22.0000 [IU] | Freq: Two times a day (BID) | SUBCUTANEOUS | Status: DC
  Administered 2018-10-10 – 2018-10-12 (×5): 22 [IU] via SUBCUTANEOUS
  Filled 2018-10-10 (×6): qty 10

## 2018-10-10 MED ORDER — PREDNISONE 50 MG PO TABS
50.0000 mg | ORAL_TABLET | Freq: Four times a day (QID) | ORAL | Status: AC
  Administered 2018-10-10 (×3): 50 mg via ORAL
  Filled 2018-10-10 (×3): qty 1

## 2018-10-10 MED ORDER — PANTOPRAZOLE SODIUM 40 MG PO TBEC
40.0000 mg | DELAYED_RELEASE_TABLET | Freq: Every day | ORAL | Status: DC
  Administered 2018-10-10 – 2018-10-12 (×3): 40 mg via ORAL
  Filled 2018-10-10 (×3): qty 1

## 2018-10-10 MED ORDER — INSULIN ASPART PROT & ASPART (70-30 MIX) 100 UNIT/ML ~~LOC~~ SUSP: 25.0000 [IU] | Freq: Two times a day (BID) | SUBCUTANEOUS | Status: DC

## 2018-10-10 MED ORDER — DIPHENHYDRAMINE HCL 25 MG PO CAPS
50.0000 mg | ORAL_CAPSULE | Freq: Once | ORAL | Status: AC
  Administered 2018-10-10: 50 mg via ORAL
  Filled 2018-10-10: qty 2

## 2018-10-10 MED ORDER — IOHEXOL 240 MG/ML SOLN
25.0000 mL | INTRAMUSCULAR | Status: AC
  Administered 2018-10-10 (×2): 25 mL via ORAL

## 2018-10-10 MED ORDER — DIPHENHYDRAMINE HCL 25 MG PO CAPS
ORAL_CAPSULE | ORAL | Status: AC
  Administered 2018-10-10: via ORAL
  Filled 2018-10-10: qty 1

## 2018-10-10 MED ORDER — DIPHENHYDRAMINE HCL 50 MG/ML IJ SOLN: 50.0000 mg | Freq: Once | INTRAMUSCULAR | Status: AC

## 2018-10-10 MED ORDER — DIPHENHYDRAMINE HCL 25 MG PO CAPS
25.0000 mg | ORAL_CAPSULE | Freq: Once | ORAL | Status: AC
  Administered 2018-10-10: via ORAL

## 2018-10-10 MED ORDER — POTASSIUM CHLORIDE CRYS ER 20 MEQ PO TBCR
40.0000 meq | EXTENDED_RELEASE_TABLET | Freq: Once | ORAL | Status: AC
  Administered 2018-10-10: 40 meq via ORAL
  Filled 2018-10-10: qty 2

## 2018-10-10 NOTE — Progress Notes (Signed)
[place] at [place] NAME: Julie Tran    MR#:  [id number]  DATE OF BIRTH:  1950/07/26  SUBJECTIVE:  CHIEF COMPLAINT:   Chief Complaint  Patient presents with  . Abdominal Pain  . Urinary Tract Infection   No new complaint this morning.  No fevers overnight.  Patient reports improvement in abdominal pains. No nausea vomiting. REVIEW OF SYSTEMS:  Review of Systems  Constitutional: Negative for chills and fever.  HENT: Negative for hearing loss and tinnitus.   Eyes: Negative for blurred vision and double vision.  Respiratory: Negative for cough and shortness of breath.   Cardiovascular: Negative for chest pain and palpitations.  Gastrointestinal: Positive for abdominal pain and nausea. Negative for heartburn and vomiting.  Genitourinary: Negative for dysuria and urgency.  Musculoskeletal: Negative for myalgias and neck pain.  Skin: Negative for itching and rash.  Neurological: Negative for dizziness and headaches.  Psychiatric/Behavioral: Negative for depression and hallucinations.    DRUG ALLERGIES:   Allergies  Allergen Reactions  . Ace Inhibitors Hives and Swelling    Had angioedema with lisinopril. Seen in [facility] ED for this.  Had angioedema with lisinopril. Seen in [facility] ED for this.    . Contrast Media [Iodinated Diagnostic Agents]   . Iodine Swelling  . Latex Hives and Swelling  . Penicillins Hives  . Salicylates Hives  . Shellfish Allergy Swelling   VITALS:  Blood pressure (!) 160/81, pulse 91, temperature 98.8 F (37.1 C), resp. rate 16, height 5\' 3"  (1.6 m), weight 77.1 kg, SpO2 97 %. PHYSICAL EXAMINATION:  Physical Exam  GENERAL:  68 y.o.-year-old patient lying in the bed.  In distress due to pain EYES: Pupils equal, round, reactive to light and accommodation. No scleral icterus. Extraocular muscles intact.  HEENT: Head atraumatic, normocephalic. Oropharynx and nasopharynx clear.  NECK:  Supple, no  jugular venous distention. No thyroid enlargement, no tenderness.  LUNGS: Normal breath sounds bilaterally, no wheezing, rales, rhonchi. No use of accessory muscles of respiration.  CARDIOVASCULAR: S1, S2 normal. No murmurs, rubs, or gallops.  ABDOMEN: Soft, mild right lower quadrant tenderness.  No rebound or guarding.  Bowel sounds present. No organomegaly or mass.  Foley in place.  EXTREMITIES: No cyanosis, clubbing or edema b/l.    NEUROLOGIC: Cranial nerves II through XII are intact. No focal Motor or sensory deficits b/l.   PSYCHIATRIC: The patient is alert and oriented x 3.  SKIN: No obvious rash, lesion, or ulcer.   LABORATORY PANEL:  Female CBC Recent Labs  Lab 10/10/18 0516  WBC 12.9*  HGB 12.9  HCT 39.4  PLT 295   ------------------------------------------------------------------------------------------------------------------ Chemistries  Recent Labs  Lab 10/10/18 0516  NA 139  K 3.2*  CL 104  CO2 28  GLUCOSE 74  BUN 7*  CREATININE 0.49  CALCIUM 8.4*  MG 2.2   RADIOLOGY:  No results found. ASSESSMENT AND PLAN:    1.UTI with urinary retention on Cefotan which is also being used for treatment of intra-abdominal infection. Has issues previously with urinary retention Foley catheter placed due to urinary retention. Will need foley at discharge and urology f/u.  2.  Pelvic abscess around the uterus.   Patient had CT abdomen and pelvis done which reveale multiple pelvic fluid collections with surrounding rim enhancement surrounding the uterus, suspicious for abscesses, most pronounced superior to the uterus on the right. On IV Cefotetan 2g Q12hr and Doxycycline 100mg  Q12hr. patient will require a total of at  least 2 weeks of antibiotics. IR was unable to drain due to size and location. Will repeat Ct abdomen today  3.Uncontrolled DM with hyperglycemia On Novolog 70/30 and SSI  4.HTN (hypertension) Blood pressure controlled on current regimen  5.  Asthma -home dose inhalers  6.  Hypokalemia; replaced  DVT prophylaxis; Lovenox  All the records are reviewed and case discussed with Care Management/Social Worker. Management plans discussed with the patient, family and they are in agreement.  CODE STATUS: Full Code  TOTAL TIME TAKING CARE OF THIS PATIENT: 37 minutes.   More than 50% of the time was spent in counseling/coordination of care: YES  POSSIBLE D/C IN 3 DAYS, DEPENDING ON CLINICAL CONDITION.   Molinda BailiffSrikar R Eddye Broxterman M.D on 10/10/2018 at 12:11 PM  Between 7am to 6pm - Pager - [phone]  After 6pm go to [email or web address] - Social research officer, governmentpassword EPAS [facility]  [facility] [place] [facility]  Office  [phone]  CC: Primary care physician; System, Pcp Not In  Note: This dictation was prepared with Dragon dictation along with smaller phrase technology. Any transcriptional errors that result from this process are unintentional.

## 2018-10-11 LAB — CREATININE, SERUM
Creatinine, Ser: 0.59 mg/dL (ref 0.44–1.00)
GFR calc Af Amer: >60 mL/min (ref >60)
GFR calc non Af Amer: >60 mL/min (ref >60)

## 2018-10-11 LAB — CBC WITH DIFFERENTIAL/PLATELET
Abs Immature Granulocytes: 0.27 10*3/uL — ABNORMAL HIGH (ref 0.00–0.07)
Basophils Absolute: 0 10*3/uL (ref 0.0–0.1)
Basophils Relative: 0 %
Eosinophils Absolute: 0 10*3/uL (ref 0.0–0.5)
Eosinophils Relative: 0 %
HCT: 37.5 % (ref 36.0–46.0)
Hemoglobin: 12.4 g/dL (ref 12.0–15.0)
Immature Granulocytes: 2 %
Lymphocytes Relative: 9 %
Lymphs Abs: 1.4 10*3/uL (ref 0.7–4.0)
MCH: 29.5 pg (ref 26.0–34.0)
MCHC: 33.1 g/dL (ref 30.0–36.0)
MCV: 89.1 fL (ref 80.0–100.0)
Monocytes Absolute: 0.8 10*3/uL (ref 0.1–1.0)
Monocytes Relative: 5 %
Neutro Abs: 12.6 10*3/uL — ABNORMAL HIGH (ref 1.7–7.7)
Neutrophils Relative %: 84 %
Platelets: 328 10*3/uL (ref 150–400)
RBC: 4.21 MIL/uL (ref 3.87–5.11)
RDW: 14.2 % (ref 11.5–15.5)
WBC: 15 10*3/uL — ABNORMAL HIGH (ref 4.0–10.5)
nRBC: 0 % (ref 0.0–0.2)

## 2018-10-11 LAB — GLUCOSE, CAPILLARY
Glucose-Capillary: 307 mg/dL — ABNORMAL HIGH (ref 70–99)
Glucose-Capillary: 262 mg/dL — ABNORMAL HIGH (ref 70–99)
Glucose-Capillary: 231 mg/dL — ABNORMAL HIGH (ref 70–99)
Glucose-Capillary: 146 mg/dL — ABNORMAL HIGH (ref 70–99)

## 2018-10-11 NOTE — Progress Notes (Signed)
Inpatient Diabetes Program Recommendations  AACE/ADA: New Consensus Statement on Inpatient Glycemic Control (2015)  Target Ranges:  Prepandial:   less than 140 mg/dL      Peak postprandial:   less than 180 mg/dL (1-2 hours)      Critically ill patients:  140 - 180 mg/dL   Lab Results  Component Value Date   GLUCAP 307 (H) 10/11/2018   HGBA1C 12.9 (H) 10/05/2018    Review of Glycemic Control  Blood sugars in past 24H - 59-354 mg/dL. Received Prednisone 50 mg Q6H on 6/6. Needs insulin adjustment for tighter glycemic control.  Recommendations:  Increase 70/30 to 27 units bid.  Will follow while inpatient.  Thank you. Lorenda Peck, RD, LDN, CDE Inpatient Diabetes Coordinator [phone]

## 2018-10-11 NOTE — Progress Notes (Signed)
[place] at [place] NAME: Julie Tran    MR#:  [id number]  DATE OF BIRTH:  12/31/1950  SUBJECTIVE:  CHIEF COMPLAINT:   Chief Complaint  Patient presents with  . Abdominal Pain  . Urinary Tract Infection   Continues to have lower abdominal pain but slowly improving.  Afebrile. REVIEW OF SYSTEMS:  Review of Systems  Constitutional: Negative for chills and fever.  HENT: Negative for hearing loss and tinnitus.   Eyes: Negative for blurred vision and double vision.  Respiratory: Negative for cough and shortness of breath.   Cardiovascular: Negative for chest pain and palpitations.  Gastrointestinal: Positive for abdominal pain and nausea. Negative for heartburn and vomiting.  Genitourinary: Negative for dysuria and urgency.  Musculoskeletal: Negative for myalgias and neck pain.  Skin: Negative for itching and rash.  Neurological: Negative for dizziness and headaches.  Psychiatric/Behavioral: Negative for depression and hallucinations.    DRUG ALLERGIES:   Allergies  Allergen Reactions  . Ace Inhibitors Hives and Swelling    Had angioedema with lisinopril. Seen in [facility] ED for this.  Had angioedema with lisinopril. Seen in [facility] ED for this.    . Contrast Media [Iodinated Diagnostic Agents]   . Iodine Swelling  . Latex Hives and Swelling  . Penicillins Hives  . Salicylates Hives  . Shellfish Allergy Swelling   VITALS:  Blood pressure (!) 154/87, pulse (!) 102, temperature 98.3 F (36.8 C), temperature source Oral, resp. rate 18, height 5\' 3"  (1.6 m), weight 77.1 kg, SpO2 99 %. PHYSICAL EXAMINATION:  Physical Exam  GENERAL:  68 y.o.-year-old patient lying in the bed. EYES: Pupils equal, round, reactive to light and accommodation. No scleral icterus. Extraocular muscles intact.  HEENT: Head atraumatic, normocephalic. Oropharynx and nasopharynx clear.  NECK:  Supple, no jugular venous distention. No thyroid enlargement,  no tenderness.  LUNGS: Normal breath sounds bilaterally, no wheezing, rales, rhonchi. No use of accessory muscles of respiration.  CARDIOVASCULAR: S1, S2 normal. No murmurs, rubs, or gallops.  ABDOMEN: Soft, mild right lower quadrant tenderness.  No rebound or guarding.  Bowel sounds present. No organomegaly or mass.  Foley in place.  EXTREMITIES: No cyanosis, clubbing or edema b/l.    NEUROLOGIC: Cranial nerves II through XII are intact. No focal Motor or sensory deficits b/l.   PSYCHIATRIC: The patient is alert and oriented x 3.  SKIN: No obvious rash, lesion, or ulcer.   LABORATORY PANEL:  Female CBC Recent Labs  Lab 10/11/18 0636  WBC 15.0*  HGB 12.4  HCT 37.5  PLT 328   ------------------------------------------------------------------------------------------------------------------ Chemistries  Recent Labs  Lab 10/10/18 0516 10/11/18 0636  NA 139  --   K 3.2*  --   CL 104  --   CO2 28  --   GLUCOSE 74  --   BUN 7*  --   CREATININE 0.49 0.59  CALCIUM 8.4*  --   MG 2.2  --    RADIOLOGY:  Ct Abdomen Pelvis W Contrast  Result Date: 10/11/2018 CLINICAL DATA:  68 year old female with history of abdominal infection/peritonitis. Followup study. EXAM: CT ABDOMEN AND PELVIS WITH CONTRAST TECHNIQUE: Multidetector CT imaging of the abdomen and pelvis was performed using the standard protocol following bolus administration of intravenous contrast. CONTRAST:  149mL OMNIPAQUE IOHEXOL 300 MG/ML  SOLN COMPARISON:  CT of the abdomen and pelvis 10/06/2018. FINDINGS: Lower chest: Unremarkable. Hepatobiliary: No suspicious cystic or solid hepatic lesions. No intra or extrahepatic biliary ductal dilatation. Gallbladder  is normal in appearance. Pancreas: No pancreatic mass. No pancreatic ductal dilatation. No pancreatic or peripancreatic fluid or inflammatory changes. Spleen: Unremarkable. Adrenals/Urinary Tract: Low-attenuation lesions in both kidneys, largest of which are compatible with simple  cysts, measuring up to 4.0 x 2.9 cm in the upper pole the right kidney. Other subcentimeter low-attenuation lesions in both kidneys are too small to definitively characterize, but are statistically likely to represent tiny cysts. Left-sided extra renal pelvis. No hydroureteronephrosis. Urinary bladder is decompressed with an indwelling Foley balloon catheter in place. Stomach/Bowel: Normal appearance of the stomach. No pathologic dilatation of small bowel or colon. Normal appendix. Vascular/Lymphatic: Aortic atherosclerosis, without evidence of aneurysm or dissection in the abdominal or pelvic vasculature. No lymphadenopathy noted in the abdomen or pelvis. Reproductive: Large complex partially calcified soft tissue attenuation mass in the low anatomic pelvis, presumably a fibroid uterus, estimated to measure approximately 6.5 x 9.3 x 7.1 cm. In the surrounding spaces of the low anatomic pelvis there are multiple low-attenuation rim enhancing fluid collections, which appear to reflect small abscesses. The largest of these is posteriorly in the presacral space (axial image 61 of series 8 and sagittal image 53 of series 6) measuring 2.3 x 2.0 x 7.8 cm. Additional low-attenuation rim enhancing structures in the right adnexa are very serpiginous in appearance and likely represent dilated inflamed right fallopian tube. Other: Trace volume of ascites.  No pneumoperitoneum. Musculoskeletal: There are no aggressive appearing lytic or blastic lesions noted in the visualized portions of the skeleton. IMPRESSION: 1. The appearance of the complex process in the pelvis is very similar to the recent prior study from 10/06/2018. Findings again suggest a fibroid uterus surrounded by multiple small pelvic abscesses. In addition, the right fallopian tube appears dilated and inflamed, likely to represent a tubo-ovarian abscess. 2. Aortic atherosclerosis. 3. Additional incidental findings, as above. Electronically Signed   By: Trudie Reedaniel   Entrikin M.D.   On: 10/11/2018 03:58   ASSESSMENT AND PLAN:    1.UTI with urinary retention on Cefotan which is also being used for treatment of intra-abdominal infection. Has issues previously with urinary retention Foley catheter placed due to urinary retention. Will need foley at discharge and urology f/u.  2.  Pelvic abscess around the uterus.   Patient had CT abdomen and pelvis done which reveale multiple pelvic fluid collections with surrounding rim enhancement surrounding the uterus, suspicious for abscesses, most pronounced superior to the uterus on the right. On IV Cefotetan 2g Q12hr and Doxycycline 100mg  Q12hr. patient will require a total of at least 2 weeks of antibiotics. IR unable to drain due to size and location. Repeat CT abdomen shows persistent pelvic abscesses along with tubo-ovarian abscess.  Discussed with Dr. Tiburcio PeaHarris of gynecology.  He will follow-up with patient.  No emergent surgery planned.  Will need outpatient follow-up with [facility] oncology as malignancy is a definite concern in situations like this.  3.Uncontrolled DM with hyperglycemia On Novolog 70/30 and SSI  Blood sugars elevated due to prednisone given yesterday for contrast allergy  4.HTN (hypertension) Blood pressure controlled on current regimen  5. Asthma -home dose inhalers  6.  Hypokalemia; replaced  DVT prophylaxis; Lovenox  All the records are reviewed and case discussed with Care Management/Social Worker. Management plans discussed with the patient, family and they are in agreement.  CODE STATUS: Full Code  TOTAL TIME TAKING CARE OF THIS PATIENT: 35 minutes.   POSSIBLE D/C IN 1-2 DAYS, DEPENDING ON CLINICAL CONDITION.   Orie FishermanSrikar R Cabell Lazenby M.D on  10/11/2018 at 12:55 PM  Between 7am to 6pm - Pager - [phone]  After 6pm go to [email or web address] - Social research officer, governmentpassword EPAS [facility]  [facility] [place] [facility]  Office  [phone]  CC: Primary care physician; System, Pcp Not In  Note:  This dictation was prepared with Dragon dictation along with smaller phrase technology. Any transcriptional errors that result from this process are unintentional.

## 2018-10-12 LAB — CBC WITH DIFFERENTIAL/PLATELET
Abs Immature Granulocytes: 0.17 10*3/uL — ABNORMAL HIGH (ref 0.00–0.07)
Basophils Absolute: 0 10*3/uL (ref 0.0–0.1)
Basophils Relative: 0 %
Eosinophils Absolute: 0.2 10*3/uL (ref 0.0–0.5)
Eosinophils Relative: 1 %
HCT: 36.1 % (ref 36.0–46.0)
Hemoglobin: 11.9 g/dL — ABNORMAL LOW (ref 12.0–15.0)
Immature Granulocytes: 1 %
Lymphocytes Relative: 24 %
Lymphs Abs: 3.4 10*3/uL (ref 0.7–4.0)
MCH: 29.4 pg (ref 26.0–34.0)
MCHC: 33 g/dL (ref 30.0–36.0)
MCV: 89.1 fL (ref 80.0–100.0)
Monocytes Absolute: 1.5 10*3/uL — ABNORMAL HIGH (ref 0.1–1.0)
Monocytes Relative: 11 %
Neutro Abs: 8.8 10*3/uL — ABNORMAL HIGH (ref 1.7–7.7)
Neutrophils Relative %: 63 %
Platelets: 337 10*3/uL (ref 150–400)
RBC: 4.05 MIL/uL (ref 3.87–5.11)
RDW: 14.4 % (ref 11.5–15.5)
WBC: 14 10*3/uL — ABNORMAL HIGH (ref 4.0–10.5)
nRBC: 0 % (ref 0.0–0.2)

## 2018-10-12 LAB — BASIC METABOLIC PANEL
Anion gap: 9 (ref 5–15)
BUN: 12 mg/dL (ref 8–23)
CO2: 28 mmol/L (ref 22–32)
Calcium: 8.3 mg/dL — ABNORMAL LOW (ref 8.9–10.3)
Chloride: 101 mmol/L (ref 98–111)
Creatinine, Ser: 0.53 mg/dL (ref 0.44–1.00)
GFR calc Af Amer: >60 mL/min (ref >60)
GFR calc non Af Amer: >60 mL/min (ref >60)
Glucose, Bld: 237 mg/dL — ABNORMAL HIGH (ref 70–99)
Potassium: 3.2 mmol/L — ABNORMAL LOW (ref 3.5–5.1)
Sodium: 138 mmol/L (ref 135–145)

## 2018-10-12 LAB — GLUCOSE, CAPILLARY
Glucose-Capillary: 176 mg/dL — ABNORMAL HIGH (ref 70–99)
Glucose-Capillary: 207 mg/dL — ABNORMAL HIGH (ref 70–99)

## 2018-10-12 MED ORDER — CEFDINIR 300 MG PO CAPS
300.0000 mg | ORAL_CAPSULE | Freq: Two times a day (BID) | ORAL | Status: DC
  Administered 2018-10-12: 300 mg via ORAL
  Filled 2018-10-12 (×2): qty 1

## 2018-10-12 MED ORDER — DOXYCYCLINE HYCLATE 100 MG PO TABS: 100.0000 mg | ORAL_TABLET | Freq: Two times a day (BID) | ORAL | 0 refills | Status: AC

## 2018-10-12 MED ORDER — TRAMADOL HCL 50 MG PO TABS: 50.0000 mg | ORAL_TABLET | Freq: Four times a day (QID) | ORAL | 0 refills | Status: DC | PRN

## 2018-10-12 MED ORDER — INSULIN ASPART PROT & ASPART (70-30 MIX) 100 UNIT/ML ~~LOC~~ SUSP: 27.0000 [IU] | Freq: Two times a day (BID) | SUBCUTANEOUS | 11 refills | Status: DC

## 2018-10-12 MED ORDER — CEFDINIR 300 MG PO CAPS: 300.0000 mg | ORAL_CAPSULE | Freq: Two times a day (BID) | ORAL | 0 refills | Status: AC

## 2018-10-12 NOTE — Care Management Important Message (Signed)
Important Message  Patient Details  Name: Julie Tran MRN: [id number] Date of Birth: [birth year]   Medicare Important Message Given:  Yes    Juliann Pulse A Laron Angelini 10/12/2018, 11:50 AM

## 2018-10-12 NOTE — Progress Notes (Signed)
Patients four point cane given back to patient at discharge

## 2018-10-12 NOTE — Progress Notes (Signed)
Received MD orders  to discharge patient to home, reviewed home meds discharge  instructions and follow up appointments, home meds and prescriptions with patient and patient verbalized  Understanding, patient given her filled antibiotic prescriptions and Humalog Mix 75/25  Kwikpens and needles at discharge.    Patient is to have her Tramadol prescription filled at the pharmacy of her choice

## 2018-10-12 NOTE — TOC Transition Note (Signed)
Transition of Care [facility]) - CM/SW Discharge Note   Patient Details  Name: Julie Tran MRN: [id number] Date of Birth: February 06, 1951  [facility] CM/SW Contact:  Annamaria Boots, [place] Phone Number: 10/12/2018, 11:12 AM   Clinical Narrative: Patient is medically ready for discharge today. CSW asked patient about home health but she is refusing. Patient does not want anyone in her home. She does need assistance obtaining medications. TOC team has spoken with Medication management and they can provide most of her medications. CSW explained that patient will need to get her pain medication filled at another pharmacy because medication management does not provide it. Patient states understanding.        Final next level of care: Home/Self Care Barriers to Discharge: No Barriers Identified   Patient Goals and CMS Choice   CMS Medicare.gov Compare Post Acute Care list provided to:: Patient Choice offered to / list presented to : Patient  Discharge Placement                    Patient and family notified of of transfer: 10/12/18  Discharge Plan and Services                                     Social Determinants of Health (SDOH) Interventions     Readmission Risk Interventions No flowsheet data found.

## 2018-10-12 NOTE — Progress Notes (Signed)
Patient said she did not want [place] contacting her family with an update on her status

## 2018-10-12 NOTE — Consult Note (Signed)
Pharmacy Antibiotic Note  Julie Tran is a 68 y.o. female admitted on 10/04/2018 with UTI/PID  Pharmacy has been consulted for po antimicrobial selection.  Assessment/Plan: Pt has been treated for both E Coli UTI and PID.  Patient has received a total of 7 days of IV antimicrobial cephalosporin therapy, along with doxycycline for 5 days.     Per Up-to-date, for PID pt will need a total of 14 days of doxycycline therapy, so will continue doxycycline at this time.  Pt has already received IV cephalosporin commensurate with PID treatment.     Will change pt to po Cefdinir in order to treat for a total of 14 days for Cefazolin sensitive E Coli UTI.  Height: 5\' 3"  (160 cm) Weight: 170 lb (77.1 kg) IBW/kg (Calculated) : 52.4  Temp (24hrs), Avg:98.4 F (36.9 C), Min:98.2 F (36.8 C), Max:98.7 F (37.1 C)  Recent Labs  Lab 10/08/18 0543 10/09/18 0607 10/10/18 0516 10/11/18 0636 10/12/18 0448  WBC 12.1* 10.2 12.9* 15.0* 14.0*  CREATININE 0.55 0.42* 0.49 0.59 0.53    Estimated Creatinine Clearance: 65.3 mL/min (by C-G formula based on SCr of 0.53 mg/dL).    Allergies  Allergen Reactions  . Ace Inhibitors Hives and Swelling    Had angioedema with lisinopril. Seen in [facility] ED for this.  Had angioedema with lisinopril. Seen in [facility] ED for this.    . Contrast Media [Iodinated Diagnostic Agents]   . Iodine Swelling  . Latex Hives and Swelling  . Penicillins Hives  . Salicylates Hives  . Shellfish Allergy Swelling    Antimicrobials this admission: CTX 5/31 >>6/2 Cefazolin 6/2 >>6/3  (Now treating for peri-uterine abcess/PID) Cefotetan 6/3 >> 6/8  Doxycycline 6/3 >> Cefdinir 6/8 >>  Dose adjustments this admission: N/A  Microbiology results: 5/31 UCx: 100k + E Coli (Cefazolin sensitive)   Thank you for allowing pharmacy to be a part of this patient's care.  Lu Duffel, PharmD, BCPS Clinical Pharmacist 10/12/2018 9:43 AM

## 2018-10-12 NOTE — Progress Notes (Signed)
PT Cancellation Note  Patient Details Name: Julie Tran MRN: [id number] DOB: 07-02-1950   Cancelled Treatment:    Reason Eval/Treat Not Completed: Other (comment)   Discharge orders noted.  Offered session but she declined.    Chesley Noon 10/12/2018, 10:44 AM

## 2018-10-12 NOTE — TOC Progression Note (Signed)
Transition of Care [facility]) - Progression Note    Patient Details  Name: Julie Tran MRN: [id number] Date of Birth: 24-Jul-1950  Transition of Care [facility]) CM/SW Contact  Marshell Garfinkel, RN Phone Number: 10/12/2018, 8:10 AM  Clinical Narrative:     Message update sent to [facility] at Medication Management and unit CSW.  Please e-scribe Rx to Medication Management when treatment plan is updated.  Patient has no drug benefits.   Expected Discharge Plan: Home/Self Care    Expected Discharge Plan and Services Expected Discharge Plan: Home/Self Care         Expected Discharge Date: 10/10/18                                     Social Determinants of Health (SDOH) Interventions    Readmission Risk Interventions No flowsheet data found.

## 2018-10-12 NOTE — Discharge Summary (Signed)
[place] at [place] NAME: Julie Tran    MR#:  [id number]  DATE OF BIRTH:  [birth year]  DATE OF ADMISSION:  10/04/2018 ADMITTING PHYSICIAN: Lance Coon, MD  DATE OF DISCHARGE: 10/12/2018  PRIMARY CARE PHYSICIAN: System, Pcp Not In    ADMISSION DIAGNOSIS:  Lower urinary tract infectious disease [N39.0] Urinary retention [R33.9]  DISCHARGE DIAGNOSIS:  Principal Problem:   UTI (urinary tract infection) Active Problems:   DKA (diabetic ketoacidoses) (HCC)   HTN (hypertension)   Asthma   SECONDARY DIAGNOSIS:   Past Medical History:  Diagnosis Date  . Asthma   . Diabetes mellitus without complication ([place])   . HTN (hypertension)   . Neuropathy   . PAD (peripheral artery disease) [facility])     HOSPITAL COURSE:    68 year old female with a history of diabetes and PAD who presented to the hospital due to abdominal pain.  1.  E. coli UTI with urinary retention: Urine culture was positive for E. coli.  Patient will be discharged on appropriate antibiotics to finish course of treatment.  Patient also was found to have urinary retention.  She has a Foley catheter placed.  She will need Foley at discharge.  She will follow-up with urology at the end of the week for voiding trial.  2.  Pelvic abscess around the uterus: CT scan of the abdomen and pelvis showed multiple pelvic fluid collections with surrounding rim enhancement surrounding the uterus suspicious for abscesses.  Patient was evaluated by GYN as well as interventional radiologist.  Interventional radiologist unable to drain due to size and location of these abscesses.  Repeat CT scan did show persistent pelvic abscesses along with tubo-ovarian abscess.  GYN feels patient should be treated for PID for a total of 2 weeks of antibiotics.  She will then need outpatient follow-up with GYN and possible oncology as malignancy is also on the differential diagnosis. She will be discharged on  cefdinir and doxycycline. 3.  Diabetes: Patient will be discharged on NovoLog 70/30.  Case management assisted with medication management.  4.  Essential hypertension: Continue Norvasc, Coreg, HCTZ  DISCHARGE CONDITIONS AND DIET:  Stable diabetic diet  CONSULTS OBTAINED:  Treatment Team:  Will Bonnet, MD  DRUG ALLERGIES:   Allergies  Allergen Reactions  . Ace Inhibitors Hives and Swelling    Had angioedema with lisinopril. Seen in [facility] ED for [facility].  Had angioedema with lisinopril. Seen in [facility] ED for this.    . Contrast Media [Iodinated Diagnostic Agents]   . Iodine Swelling  . Latex Hives and Swelling  . Penicillins Hives  . Salicylates Hives  . Shellfish Allergy Swelling    DISCHARGE MEDICATIONS:   Allergies as of 10/12/2018      Reactions   Ace Inhibitors Hives, Swelling   Had angioedema with lisinopril. Seen in [facility] ED for this.  Had angioedema with lisinopril. Seen in [facility] ED for this.    Contrast Media [iodinated Diagnostic Agents]    Iodine Swelling   Latex Hives, Swelling   Penicillins Hives   Salicylates Hives   Shellfish Allergy Swelling      Medication List    STOP taking these medications   metFORMIN 1000 MG tablet Commonly known as:  GLUCOPHAGE     TAKE these medications   amLODipine 5 MG tablet Commonly known as:  NORVASC Take 5 mg by mouth daily.   aspirin EC 81 MG tablet Take 81 mg by mouth daily.  carvedilol 6.25 MG tablet Commonly known as:  COREG Take 6.2 mg by mouth every 12 (twelve) hours.   cefdinir 300 MG capsule Commonly known as:  OMNICEF Take 1 capsule (300 mg total) by mouth every 12 (twelve) hours for 12 days.   doxycycline 100 MG tablet Commonly known as:  VIBRA-TABS Take 1 tablet (100 mg total) by mouth every 12 (twelve) hours for 12 days.   hydrochlorothiazide 25 MG tablet Commonly known as:  HYDRODIURIL Take 25 mg by mouth daily.   insulin aspart protamine- aspart (70-30) 100 UNIT/ML  injection Commonly known as:  NOVOLOG MIX 70/30 Inject 0.27 mLs (27 Units total) into the skin 2 (two) times daily with a meal.   magnesium oxide 400 MG tablet Commonly known as:  MAG-OX Take 800 mg by mouth 2 (two) times daily.   Potassium Chloride ER 20 MEQ Tbcr Take 20 mEq by mouth daily.   traMADol 50 MG tablet Commonly known as:  ULTRAM Take 1 tablet (50 mg total) by mouth every 6 (six) hours as needed for moderate pain.         Today   CHIEF COMPLAINT:  Doing ok this am no dysuria   VITAL SIGNS:  Blood pressure 133/74, pulse 88, temperature 98 F (36.7 C), temperature source Oral, resp. rate 16, height 5\' 3"  (1.6 m), weight 77.1 kg, SpO2 98 %.   REVIEW OF SYSTEMS:  Review of Systems  Constitutional: Negative.  Negative for chills, fever and malaise/fatigue.  HENT: Negative.  Negative for ear discharge, ear pain, hearing loss, nosebleeds and sore throat.   Eyes: Negative for blurred vision and pain.  Respiratory: Negative.  Negative for cough, hemoptysis, shortness of breath and wheezing.   Cardiovascular: Negative.  Negative for chest pain, palpitations and leg swelling.  Gastrointestinal: Negative.  Negative for abdominal pain, blood in stool, diarrhea, nausea and vomiting.  Genitourinary: Negative.  Negative for dysuria.  Musculoskeletal: Negative.  Negative for back pain.  Skin: Negative.   Neurological: Negative for dizziness, tremors, speech change, focal weakness, seizures and headaches.  Endo/Heme/Allergies: Negative.  Does not bruise/bleed easily.  Psychiatric/Behavioral: Negative.  Negative for depression, hallucinations and suicidal ideas.     PHYSICAL EXAMINATION:  GENERAL:  68 y.o.-year-old patient lying in the bed with no acute distress.  NECK:  Supple, no jugular venous distention. No thyroid enlargement, no tenderness.  LUNGS: Normal breath sounds bilaterally, no wheezing, rales,rhonchi  No use of accessory muscles of respiration.   CARDIOVASCULAR: S1, S2 normal. No murmurs, rubs, or gallops.  ABDOMEN: Soft, non-tender, non-distended. Bowel sounds present. No organomegaly or mass.  EXTREMITIES: No pedal edema, cyanosis, or clubbing.  PSYCHIATRIC: The patient is alert and oriented x 3.  SKIN: No obvious rash, lesion, or ulcer.   DATA REVIEW:   CBC Recent Labs  Lab 10/12/18 0448  WBC 14.0*  HGB 11.9*  HCT 36.1  PLT 337    Chemistries  Recent Labs  Lab 10/10/18 0516  10/12/18 0448  NA 139  --  138  K 3.2*  --  3.2*  CL 104  --  101  CO2 28  --  28  GLUCOSE 74  --  237*  BUN 7*  --  12  CREATININE 0.49   < > 0.53  CALCIUM 8.4*  --  8.3*  MG 2.2  --   --    < > = values in this interval not displayed.    Cardiac Enzymes No results for input(s): TROPONINI in the last  168 hours.  Microbiology Results  @[email or web address] @  RADIOLOGY:  Ct Abdomen Pelvis W Contrast  Result Date: 10/11/2018 CLINICAL DATA:  68 year old female with history of abdominal infection/peritonitis. Followup study. EXAM: CT ABDOMEN AND PELVIS WITH CONTRAST TECHNIQUE: Multidetector CT imaging of the abdomen and pelvis was performed using the standard protocol following bolus administration of intravenous contrast. CONTRAST:  100mL OMNIPAQUE IOHEXOL 300 MG/ML  SOLN COMPARISON:  CT of the abdomen and pelvis 10/06/2018. FINDINGS: Lower chest: Unremarkable. Hepatobiliary: No suspicious cystic or solid hepatic lesions. No intra or extrahepatic biliary ductal dilatation. Gallbladder is normal in appearance. Pancreas: No pancreatic mass. No pancreatic ductal dilatation. No pancreatic or peripancreatic fluid or inflammatory changes. Spleen: Unremarkable. Adrenals/Urinary Tract: Low-attenuation lesions in both kidneys, largest of which are compatible with simple cysts, measuring up to 4.0 x 2.9 cm in the upper pole the right kidney. Other subcentimeter low-attenuation lesions in both kidneys are too small to definitively characterize, but are  statistically likely to represent tiny cysts. Left-sided extra renal pelvis. No hydroureteronephrosis. Urinary bladder is decompressed with an indwelling Foley balloon catheter in place. Stomach/Bowel: Normal appearance of the stomach. No pathologic dilatation of small bowel or colon. Normal appendix. Vascular/Lymphatic: Aortic atherosclerosis, without evidence of aneurysm or dissection in the abdominal or pelvic vasculature. No lymphadenopathy noted in the abdomen or pelvis. Reproductive: Large complex partially calcified soft tissue attenuation mass in the low anatomic pelvis, presumably a fibroid uterus, estimated to measure approximately 6.5 x 9.3 x 7.1 cm. In the surrounding spaces of the low anatomic pelvis there are multiple low-attenuation rim enhancing fluid collections, which appear to reflect small abscesses. The largest of these is posteriorly in the presacral space (axial image 61 of series 8 and sagittal image 53 of series 6) measuring 2.3 x 2.0 x 7.8 cm. Additional low-attenuation rim enhancing structures in the right adnexa are very serpiginous in appearance and likely represent dilated inflamed right fallopian tube. Other: Trace volume of ascites.  No pneumoperitoneum. Musculoskeletal: There are no aggressive appearing lytic or blastic lesions noted in the visualized portions of the skeleton. IMPRESSION: 1. The appearance of the complex process in the pelvis is very similar to the recent prior study from 10/06/2018. Findings again suggest a fibroid uterus surrounded by multiple small pelvic abscesses. In addition, the right fallopian tube appears dilated and inflamed, likely to represent a tubo-ovarian abscess. 2. Aortic atherosclerosis. 3. Additional incidental findings, as above. Electronically Signed   By: Trudie Reedaniel  Entrikin M.D.   On: 10/11/2018 03:58      Allergies as of 10/12/2018      Reactions   Ace Inhibitors Hives, Swelling   Had angioedema with lisinopril. [facility].  Had angioedema with lisinopril. [facility].    Contrast Media [iodinated Diagnostic Agents]    Iodine Swelling   Latex Hives, Swelling   Penicillins Hives   Salicylates Hives   Shellfish Allergy Swelling      Medication List    STOP taking these medications   metFORMIN 1000 MG tablet Commonly known as:  GLUCOPHAGE     TAKE these medications   amLODipine 5 MG tablet Commonly known as:  NORVASC Take 5 mg by mouth daily.   aspirin EC 81 MG tablet Take 81 mg by mouth daily.   carvedilol 6.25 MG tablet Commonly known as:  COREG Take 6.2 mg by mouth every 12 (twelve) hours.   cefdinir 300 MG capsule Commonly known as:  OMNICEF Take 1 capsule (  300 mg total) by mouth every 12 (twelve) hours for 12 days.   doxycycline 100 MG tablet Commonly known as:  VIBRA-TABS Take 1 tablet (100 mg total) by mouth every 12 (twelve) hours for 12 days.   hydrochlorothiazide 25 MG tablet Commonly known as:  HYDRODIURIL Take 25 mg by mouth daily.   insulin aspart protamine- aspart (70-30) 100 UNIT/ML injection Commonly known as:  NOVOLOG MIX 70/30 Inject 0.27 mLs (27 Units total) into the skin 2 (two) times daily with a meal.   magnesium oxide 400 MG tablet Commonly known as:  MAG-OX Take 800 mg by mouth 2 (two) times daily.   Potassium Chloride ER 20 MEQ Tbcr Take 20 mEq by mouth daily.   traMADol 50 MG tablet Commonly known as:  ULTRAM Take 1 tablet (50 mg total) by mouth every 6 (six) hours as needed for moderate pain.          Management plans discussed with the patient and she is in agreement. Stable for discharge   Patient should follow up with pcp urology and GYN  CODE STATUS:     Code Status Orders  (From admission, onward)         Start     Ordered   10/05/18 0610  Full code  Continuous     10/05/18 0609        Code Status History    This patient has a current code status but no historical code status.      TOTAL TIME TAKING  CARE OF THIS PATIENT: 39 minutes.    Note: This dictation was prepared with Dragon dictation along with smaller phrase technology. Any transcriptional errors that result from this process are unintentional.  Adrian SaranSital Rheagan Nayak M.D on 10/12/2018 at 11:56 AM  Between 7am to 6pm - Pager - [phone] After 6pm go to [email or web address] - password [email or web address]  [facility] [place]  Office  [phone]  CC: Primary care physician; System, Pcp Not In

## 2018-10-12 NOTE — TOC Progression Note (Signed)
Transition of Care [facility]) - Progression Note    Patient Details  Name: Bular Hickok MRN: [id number] Date of Birth: 14-May-1950  Transition of Care [facility]) CM/SW Contact  Marshell Garfinkel, RN Phone Number: 10/12/2018, 1:59 PM  Clinical Narrative:     Medication Management is bringing over patient's medications now.  Patient updated that she will need to take pain medication to outside pharmacy. Application to both [facility] and Medication Management placed in patient discharge packet along with prescriptions. Patient to follow up with Urology (scheduled for 10/19/18) to pull urinary Foley.  No other RNCM needs.  Expected Discharge Plan: Home/Self Care Barriers to Discharge: No Barriers Identified  Expected Discharge Plan and Services Expected Discharge Plan: Home/Self Care   Discharge Planning Services: Medication Assistance, Follow-up appt scheduled, [facility]     Expected Discharge Date: 10/12/18                         HH Arranged: Refused HH           Social Determinants of Health (SDOH) Interventions    Readmission Risk Interventions Readmission Risk Prevention Plan 10/12/2018  Post Dischage Appt Complete  Medication Screening Complete  Transportation Screening Complete

## 2018-10-13 ENCOUNTER — Encounter: Payer: Medicare Other | Admitting: Obstetrics and Gynecology

## 2018-10-14 ENCOUNTER — Ambulatory Visit (INDEPENDENT_AMBULATORY_CARE_PROVIDER_SITE_OTHER): Payer: Medicare Other | Admitting: Obstetrics and Gynecology

## 2018-10-14 ENCOUNTER — Other Ambulatory Visit: Payer: Self-pay

## 2018-10-14 ENCOUNTER — Telehealth: Payer: Self-pay | Admitting: Pharmacist

## 2018-10-14 ENCOUNTER — Encounter: Payer: Self-pay | Admitting: Obstetrics and Gynecology

## 2018-10-14 ENCOUNTER — Other Ambulatory Visit (HOSPITAL_COMMUNITY)
Admission: RE | Admit: 2018-10-14 | Discharge: 2018-10-14 | Disposition: A | Discharge status: Home / Self Care | Payer: Medicare Other | Source: Ambulatory Visit | Attending: Obstetrics and Gynecology | Admitting: Obstetrics and Gynecology

## 2018-10-14 VITALS — BP 136/90 | Ht 63.0 in | Wt 163.0 lb

## 2018-10-14 DIAGNOSIS — N739 Female pelvic inflammatory disease, unspecified: Secondary | ICD-10-CM

## 2018-10-14 DIAGNOSIS — Z124 Encounter for screening for malignant neoplasm of cervix: Secondary | ICD-10-CM

## 2018-10-14 NOTE — Progress Notes (Signed)
Obstetrics & Gynecology Office Visit    Chief Complaint  Patient presents with  . ER follow up  Follow up from admission for pelvic abscesses  History of Present Illness: 68 y.o. menopausal female who presents in follow up from a recent hospitalization.  She was initially admitted on June 1 for urinary retention.  She also had abdominal pain and some elevated blood sugars.  Due to ongoing abdominal pain a CT was obtained and pelvic abscesses were noted.  I was consulted during her hospitalization and she was started on pelvic inflammatory disease antibiotic regimen.  She had another CT scan on June 6 that was essentially unchanged from her scan on June 2.  Given her age, and pelvic abscesses, there is concern for malignancy.  She states she still has some abdominal pain.  She follows up with me today to discuss ongoing management.    Past Medical History:  Diagnosis Date  . Asthma   . Diabetes mellitus without complication (HCC)   . HTN (hypertension)   . Neuropathy   . PAD (peripheral artery disease) (HCC)     Past Surgical History:  Procedure Laterality Date  . CARDIAC SURGERY      Gynecologic History: No LMP recorded. (Menstrual status: Perimenopausal).  Obstetric History: No obstetric history on file.  No family history on file.  Social History   Socioeconomic History  . Marital status: Widowed    Spouse name: Not on file  . Number of children: Not on file  . Years of education: Not on file  . Highest education level: Not on file  Occupational History  . Not on file  Social Needs  . Financial resource strain: Not very hard  . Food insecurity:    Worry: Patient refused    Inability: Patient refused  . Transportation needs:    Medical: Patient refused    Non-medical: Patient refused  Tobacco Use  . Smoking status: Never Smoker  . Smokeless tobacco: Never Used  Substance and Sexual Activity  . Alcohol use: Never    Frequency: Never  . Drug use: Never  .  Sexual activity: Not Currently  Lifestyle  . Physical activity:    Days per week: Patient refused    Minutes per session: Patient refused  . Stress: Only a little  Relationships  . Social connections:    Talks on phone: Patient refused    Gets together: Patient refused    Attends religious service: Patient refused    Active member of club or organization: Patient refused    Attends meetings of clubs or organizations: Patient refused    Relationship status: Patient refused  . Intimate partner violence:    Fear of current or ex partner: Patient refused    Emotionally abused: Patient refused    Physically abused: Patient refused    Forced sexual activity: Patient refused  Other Topics Concern  . Not on file  Social History Narrative  . Not on file    Allergies  Allergen Reactions  . Ace Inhibitors Hives and Swelling    Had angioedema with lisinopril. [facility].  Had angioedema with lisinopril. [facility].    . Contrast Media [Iodinated Diagnostic Agents]   . Iodine Swelling  . Latex Hives and Swelling  . Penicillins Hives  . Salicylates Hives  . Shellfish Allergy Swelling    Prior to Admission medications   Medication Sig Start Date End Date Taking? Authorizing Provider  amLODipine (NORVASC)  5 MG tablet Take 5 mg by mouth daily. 05/13/16   [provider]  aspirin EC 81 MG tablet Take 81 mg by mouth daily. 11/13/17 11/13/18  [provider]  carvedilol (COREG) 6.25 MG tablet Take 6.2 mg by mouth every 12 (twelve) hours. 11/12/17 11/12/18  [provider]  cefdinir (OMNICEF) 300 MG capsule Take 1 capsule (300 mg total) by mouth every 12 (twelve) hours for 12 days. 10/12/18 10/24/18  Adrian SaranMody, Sital, MD  doxycycline (VIBRA-TABS) 100 MG tablet Take 1 tablet (100 mg total) by mouth every 12 (twelve) hours for 12 days. 10/12/18 10/24/18  Adrian SaranMody, Sital, MD  hydrochlorothiazide (HYDRODIURIL) 25 MG tablet Take 25 mg by mouth daily.  11/13/17 11/13/18  [provider]  insulin aspart protamine- aspart (NOVOLOG MIX 70/30) (70-30) 100 UNIT/ML injection Inject 0.27 mLs (27 Units total) into the skin 2 (two) times daily with a meal. 10/12/18   Adrian SaranMody, Sital, MD  magnesium oxide (MAG-OX) 400 MG tablet Take 800 mg by mouth 2 (two) times daily. 11/12/17 11/12/18  [provider]  Potassium Chloride ER 20 MEQ TBCR Take 20 mEq by mouth daily. 11/13/17   [provider]  traMADol (ULTRAM) 50 MG tablet Take 1 tablet (50 mg total) by mouth every 6 (six) hours as needed for moderate pain. 10/12/18   Adrian SaranMody, Sital, MD    Review of Systems  Constitutional: Negative.   HENT: Negative.   Eyes: Negative.   Respiratory: Negative.   Cardiovascular: Negative.   Gastrointestinal: Positive for abdominal pain (mild) and constipation. Negative for blood in stool, diarrhea, heartburn, melena, nausea and vomiting.  Genitourinary: Negative.        She has a foley catheter in place  Musculoskeletal: Negative.   Skin: Negative.   Neurological: Negative.   Psychiatric/Behavioral: Negative.      Physical Exam BP 136/90   Ht 5\' 3"  (1.6 m)   Wt 163 lb (73.9 kg)   BMI 28.87 kg/m  No LMP recorded. (Menstrual status: Perimenopausal). Physical Exam Constitutional:      General: She is not in acute distress.    Appearance: Normal appearance. She is well-developed.  Genitourinary:     Pelvic exam was performed with patient in the lithotomy position.     Vulva, inguinal canal and urethra normal.     No posterior fourchette tenderness, injury or lesion present.     Bladder exam comments: Foley cathter in place.     No lesions in the vagina.     Vaginal erythema and atrophic mucosa present.     No vaginal discharge, tenderness, bleeding, ulceration or rugosity.     No cervical lesion, bleeding or polyp.     Genitourinary Comments: Notably, she has less erythema than during her examination at the hospital.   Bimanual is limited. Her  uterus is enlarged and irregular in contour. The exam was limited by her discomfort.  Cells were taken in sample for cervical cytology, given suspicion for malignancy.   HENT:     Head: Normocephalic and atraumatic.  Eyes:     General: No scleral icterus.    Conjunctiva/sclera: Conjunctivae normal.  Neck:     Musculoskeletal: Normal range of motion and neck supple.  Cardiovascular:     Rate and Rhythm: Normal rate and regular rhythm.     Heart sounds: No murmur. No friction rub. No gallop.   Pulmonary:     Effort: Pulmonary effort is normal. No respiratory distress.     Breath sounds: Normal  breath sounds. No wheezing or rales.  Abdominal:     General: Bowel sounds are normal. There is no distension.     Palpations: Abdomen is soft. There is no mass.     Tenderness: There is no abdominal tenderness. There is no guarding or rebound.  Musculoskeletal: Normal range of motion.  Neurological:     General: No focal deficit present.     Mental Status: She is alert and oriented to person, place, and time.     Cranial Nerves: No cranial nerve deficit.  Skin:    General: Skin is warm and dry.     Findings: No erythema.  Psychiatric:        Mood and Affect: Mood normal.        Behavior: Behavior normal.        Judgment: Judgment normal.     Female chaperone present for pelvic and breast  portions of the physical exam  Assessment: 68 y.o. No obstetric history on file. female here for  1. Pelvic abscess in female   2. Pap smear for cervical cancer screening      Plan: Problem List Items Addressed This Visit    None    Visit Diagnoses    Pelvic abscess in female    -  Primary   Relevant Orders   Cytology - PAP   CT ABDOMEN PELVIS WO CONTRAST   Pap smear for cervical cancer screening       Relevant Orders   Cytology - PAP     We again discussed in detail my concern about her having a malignancy originating in her pelvis.  It is uncommon for a woman who is menopausal and not  sexually active to have a pelvic abscess.  She does not think she has cancer, but is in agreement to proceed with examination.  The follow-up plan after today would be to repeat the CT scan in about 4 weeks, per the recommendations from gynecologic oncology.  She voiced understanding and agreement with this plan.  Prentice Docker, MD 10/14/2018 5:20 PM

## 2018-10-14 NOTE — Telephone Encounter (Signed)
Julie Tran was recently discharged from [facility].  [facility] received prescriptions for and filled Humalog MIX 75/25, cefdinir and doxycycline.  She has Medicare A/B and not D (prescription). Patient called this morning to inquire about "her other medications". She has a telephone appointment with Dr. Ladoris Gene [facility]) this Friday. I encouraged her to discuss her medication regimen at that visit. Patient is concerned that this is a phone visit vs. face-to-face. She called their office to try and get that switched. I have referred her to our eligibility coordinator. She will follow up with her when she returns on Monday. If Julie Tran qualifies for LIS/MSP, she may be able to sign up for a Medicare Part D plan. If not, she will need to wait until the annual enrollment period in the fall. During the interim, [facility] will fill her medications.   Julie Tran, PharmD [facility] Operations Coordinator [phone]

## 2018-10-15 ENCOUNTER — Telehealth: Payer: Self-pay | Admitting: Obstetrics and Gynecology

## 2018-10-15 NOTE — Telephone Encounter (Signed)
Patient is aware of schedule CT Scan for 11/30/18. Patient aware of location, date, time. Patient aware of Noothing by mouth after 12 am the not before. Patient awake to stop by the day before schedule appointment to pick up her drink. Routing Message to [place] for authorization.

## 2018-10-15 NOTE — Telephone Encounter (Signed)
-----   Message from [place] Lodge sent at 10/15/2018  9:29 AM EDT ----- Regarding: FW: CT abdomen/Pelvis Clarise Cruz please schedule, notify the patient, and let me know the appointment date so I can get an [place]. Thanks. ----- Message ----- From: Will Bonnet, MD Sent: 10/14/2018   5:20 PM EDT To: Alexandria Lodge Subject: CT abdomen/Pelvis                              See order for CT abdomen/pelvis for 1 month from now. Thanks!

## 2018-10-16 ENCOUNTER — Emergency Department
Admission: EM | Admit: 2018-10-16 | Discharge: 2018-10-16 | Disposition: A | Discharge status: Home / Self Care | Payer: Medicare Other | Attending: Emergency Medicine | Admitting: Emergency Medicine

## 2018-10-16 ENCOUNTER — Encounter: Payer: Self-pay | Admitting: Emergency Medicine

## 2018-10-16 ENCOUNTER — Other Ambulatory Visit: Payer: Self-pay

## 2018-10-16 DIAGNOSIS — I10 Essential (primary) hypertension: Secondary | ICD-10-CM | POA: Diagnosis not present

## 2018-10-16 DIAGNOSIS — N3090 Cystitis, unspecified without hematuria: Secondary | ICD-10-CM

## 2018-10-16 DIAGNOSIS — Y69 Unspecified misadventure during surgical and medical care: Secondary | ICD-10-CM | POA: Insufficient documentation

## 2018-10-16 DIAGNOSIS — Z960 Presence of urogenital implants: Secondary | ICD-10-CM | POA: Insufficient documentation

## 2018-10-16 DIAGNOSIS — J45909 Unspecified asthma, uncomplicated: Secondary | ICD-10-CM | POA: Insufficient documentation

## 2018-10-16 DIAGNOSIS — Z91040 Latex allergy status: Secondary | ICD-10-CM | POA: Diagnosis not present

## 2018-10-16 DIAGNOSIS — E1140 Type 2 diabetes mellitus with diabetic neuropathy, unspecified: Secondary | ICD-10-CM | POA: Diagnosis not present

## 2018-10-16 DIAGNOSIS — T839XXA Unspecified complication of genitourinary prosthetic device, implant and graft, initial encounter: Secondary | ICD-10-CM

## 2018-10-16 LAB — URINALYSIS, COMPLETE (UACMP) WITH MICROSCOPIC
Bilirubin Urine: NEGATIVE
Glucose, UA: NEGATIVE mg/dL
Ketones, ur: NEGATIVE mg/dL
Nitrite: NEGATIVE
Protein, ur: 100 mg/dL — AB
RBC / HPF: >50 RBC/hpf — ABNORMAL HIGH (ref 0–5)
Specific Gravity, Urine: 1.011 (ref 1.005–1.030)
WBC, UA: >50 WBC/hpf — ABNORMAL HIGH (ref 0–5)
pH: 8 (ref 5.0–8.0)

## 2018-10-16 NOTE — ED Notes (Signed)
Patient given water cup, apple sauce, and sandwich tray

## 2018-10-16 NOTE — ED Notes (Signed)
ED Provider at bedside. 

## 2018-10-16 NOTE — ED Notes (Signed)
Foley discontinued.  Patient tolerated well.

## 2018-10-16 NOTE — ED Notes (Signed)
Patient given water cup and two warm blankets. Pt instructed to press call bell when she can give needed urine sample

## 2018-10-16 NOTE — ED Notes (Signed)
Pt assisted to the toilet in room.  

## 2018-10-16 NOTE — ED Triage Notes (Signed)
Patient presents to the ED for a leak in her catheter.  Patient states the catheter was placed on Monday and is supposed to be taken out on Monday.  Patient states she called the urology office and they instructed patient to come to the ED.

## 2018-10-16 NOTE — ED Provider Notes (Signed)
[facility] Emergency Department Provider Note       Time seen: ----------------------------------------- 9:25 AM on 10/16/2018 -----------------------------------------   I have reviewed the triage vital signs and the nursing notes.  HISTORY   Chief Complaint Other (Foley Catheter Leaking)    HPI Julie Tran is a 68 y.o. female with a history of asthma, diabetes, hypertension, peripheral arterial disease who presents to the ED for urinary leakage around the Foley catheter.  Patient had a Foley catheter placed 5 days ago and states it was supposed to be taken out on Monday.  She states she called urology but they were not able to see her today so she was encouraged to come to the ER for evaluation.  Past Medical History:  Diagnosis Date  . Asthma   . Diabetes mellitus without complication ([place])   . HTN (hypertension)   . Neuropathy   . PAD (peripheral artery disease) [facility])     Patient Active Problem List   Diagnosis Date Noted  . UTI (urinary tract infection) 10/04/2018  . DKA (diabetic ketoacidoses) ([place]) 10/04/2018  . HTN (hypertension) 10/04/2018  . Asthma 10/04/2018    Past Surgical History:  Procedure Laterality Date  . CARDIAC SURGERY      Allergies Ace inhibitors, Contrast media [iodinated diagnostic agents], Iodine, Latex, Penicillins, Salicylates, and Shellfish allergy  Social History Social History   Tobacco Use  . Smoking status: Never Smoker  . Smokeless tobacco: Never Used  Substance Use Topics  . Alcohol use: Never    Frequency: Never  . Drug use: Never    Review of Systems Constitutional: Negative for fever. Cardiovascular: Negative for chest pain. Respiratory: Negative for shortness of breath. Gastrointestinal: Negative for abdominal pain, vomiting and diarrhea. Genitourinary: Positive for leakage of urine Musculoskeletal: Negative for back pain. Skin: Negative for rash. Neurological: Negative for headaches,  focal weakness or numbness.  All systems negative/normal/unremarkable except as stated in the HPI  ____________________________________________   PHYSICAL EXAM:  VITAL SIGNS: ED Triage Vitals  Enc Vitals Group     BP 10/16/18 0854 104/70     Pulse Rate 10/16/18 0854 94     Resp --      Temp 10/16/18 0854 98.2 F (36.8 C)     Temp Source 10/16/18 0854 Oral     SpO2 10/16/18 0854 98 %     Weight 10/16/18 0855 160 lb (72.6 kg)     Height 10/16/18 0855 5' 3.25" (1.607 m)     Head Circumference --      Peak Flow --      Pain Score 10/16/18 0905 5     Pain Loc --      Pain Edu? --      Excl. in [place]? --    Constitutional: Alert and oriented. Well appearing and in no distress. Musculoskeletal: Nontender with normal range of motion in extremities. No lower extremity tenderness nor edema. Neurologic:  Normal speech and language. No gross focal neurologic deficits are appreciated.  Skin:  Skin is warm, dry and intact. No rash noted. Psychiatric: Mood and affect are normal. Speech and behavior are normal.  ____________________________________________  ED COURSE:  As part of my medical decision making, I reviewed the following data within the [place] History obtained from family if available, nursing notes, old chart and ekg, as well as notes from prior ED visits. Patient presented for problems with her Foley catheter, we will assess with labs and imaging as indicated at this  time. Clinical Course as of Oct 16 1131  Fri Oct 16, 2018  1100 Patient was able to urinate after the Foley catheter was removed.   [JW]    Clinical Course User Index [JW] Emily FilbertWilliams, Cheyenna Pankowski E, MD   Procedures  Julie Tran was evaluated in Emergency Department on 10/16/2018 for the symptoms described in the history of present illness. She was evaluated in the context of the global COVID-19 pandemic, which necessitated consideration that the patient might be at risk for infection with the  SARS-CoV-2 virus that causes COVID-19. Institutional protocols and algorithms that pertain to the evaluation of patients at risk for COVID-19 are in a state of rapid change based on information released by regulatory bodies including the CDC and federal and state organizations. These policies and algorithms were followed during the patient's care in the ED.  ____________________________________________   LABS (pertinent positives/negatives)  Labs Reviewed  URINALYSIS, COMPLETE (UACMP) WITH MICROSCOPIC - Abnormal; Notable for the following components:      Result Value   Color, Urine YELLOW (*)    APPearance CLOUDY (*)    Hgb urine dipstick MODERATE (*)    Protein, ur 100 (*)    Leukocytes,Ua LARGE (*)    RBC / HPF >50 (*)    WBC, UA >50 (*)    Bacteria, UA RARE (*)    All other components within normal limits  URINE CULTURE   ____________________________________________   DIFFERENTIAL DIAGNOSIS   Foley catheter dysfunction, UTI  FINAL ASSESSMENT AND PLAN  Foley catheter dysfunction, cystitis   Plan: The patient had presented for problems with her Foley catheter. Patient's labs did indicate persistent urinary tract infection.  She is currently on antibiotics and will see urology on Monday for reevaluation.    Ulice DashJohnathan E Kamille Toomey, MD    Note: This note was generated in part or whole with voice recognition software. Voice recognition is usually quite accurate but there are transcription errors that can and very often do occur. I apologize for any typographical errors that were not detected and corrected.     Emily FilbertWilliams, Anahita Cua E, MD 10/16/18 1133

## 2018-10-17 LAB — URINE CULTURE
Culture: NO GROWTH
Special Requests: NORMAL

## 2018-10-19 ENCOUNTER — Other Ambulatory Visit: Payer: Self-pay

## 2018-10-19 ENCOUNTER — Encounter: Payer: Self-pay | Admitting: Emergency Medicine

## 2018-10-19 ENCOUNTER — Encounter: Payer: Self-pay | Admitting: Urology

## 2018-10-19 ENCOUNTER — Emergency Department
Admission: EM | Admit: 2018-10-19 | Discharge: 2018-10-19 | Disposition: A | Discharge status: Home / Self Care | Payer: Medicare Other | Attending: Student in an Organized Health Care Education/Training Program | Admitting: Student in an Organized Health Care Education/Training Program

## 2018-10-19 ENCOUNTER — Ambulatory Visit (INDEPENDENT_AMBULATORY_CARE_PROVIDER_SITE_OTHER): Payer: Medicare Other | Admitting: Urology

## 2018-10-19 ENCOUNTER — Emergency Department: Payer: Medicare Other

## 2018-10-19 VITALS — BP 152/119 | HR 96 | Ht 63.0 in | Wt 160.0 lb

## 2018-10-19 DIAGNOSIS — Z79899 Other long term (current) drug therapy: Secondary | ICD-10-CM | POA: Insufficient documentation

## 2018-10-19 DIAGNOSIS — S63501A Unspecified sprain of right wrist, initial encounter: Secondary | ICD-10-CM | POA: Diagnosis not present

## 2018-10-19 DIAGNOSIS — I10 Essential (primary) hypertension: Secondary | ICD-10-CM | POA: Diagnosis not present

## 2018-10-19 DIAGNOSIS — M25511 Pain in right shoulder: Secondary | ICD-10-CM | POA: Diagnosis not present

## 2018-10-19 DIAGNOSIS — R339 Retention of urine, unspecified: Secondary | ICD-10-CM | POA: Diagnosis not present

## 2018-10-19 DIAGNOSIS — Z7982 Long term (current) use of aspirin: Secondary | ICD-10-CM | POA: Insufficient documentation

## 2018-10-19 DIAGNOSIS — J45909 Unspecified asthma, uncomplicated: Secondary | ICD-10-CM | POA: Diagnosis not present

## 2018-10-19 DIAGNOSIS — W010XXA Fall on same level from slipping, tripping and stumbling without subsequent striking against object, initial encounter: Secondary | ICD-10-CM | POA: Diagnosis not present

## 2018-10-19 DIAGNOSIS — E119 Type 2 diabetes mellitus without complications: Secondary | ICD-10-CM | POA: Diagnosis not present

## 2018-10-19 DIAGNOSIS — Z794 Long term (current) use of insulin: Secondary | ICD-10-CM | POA: Diagnosis not present

## 2018-10-19 DIAGNOSIS — Y9389 Activity, other specified: Secondary | ICD-10-CM | POA: Insufficient documentation

## 2018-10-19 DIAGNOSIS — Y999 Unspecified external cause status: Secondary | ICD-10-CM | POA: Diagnosis not present

## 2018-10-19 DIAGNOSIS — Z91040 Latex allergy status: Secondary | ICD-10-CM | POA: Diagnosis not present

## 2018-10-19 DIAGNOSIS — M25561 Pain in right knee: Secondary | ICD-10-CM | POA: Insufficient documentation

## 2018-10-19 DIAGNOSIS — S6991XA Unspecified injury of right wrist, hand and finger(s), initial encounter: Secondary | ICD-10-CM | POA: Diagnosis present

## 2018-10-19 DIAGNOSIS — Y9289 Other specified places as the place of occurrence of the external cause: Secondary | ICD-10-CM | POA: Insufficient documentation

## 2018-10-19 LAB — CYTOLOGY - PAP: Diagnosis: NEGATIVE

## 2018-10-19 LAB — BLADDER SCAN AMB NON-IMAGING

## 2018-10-19 MED ORDER — TRAMADOL HCL 50 MG PO TABS: 50.0000 mg | ORAL_TABLET | Freq: Two times a day (BID) | ORAL | 0 refills | Status: DC

## 2018-10-19 NOTE — ED Provider Notes (Signed)
[facility] Emergency Department Provider Note   ____________________________________________   First MD Initiated Contact with Patient 10/19/18 0902     (approximate)  I have reviewed the triage vital signs and the nursing notes.   HISTORY  Chief Complaint Shoulder Pain, Knee Pain, and Wrist Pain    HPI Julie Tran is a 68 y.o. female patient complain of pain to right shoulder, right wrist, and right knee secondary to a fall.  Patient [facility] for [facility] slipped and fell.  Patient sent to the emergency room for a definitive evaluation and treatment.  Patient denies LOC or head injury.   Patient rates the pain as a 3/10.  Patient described the pain is "achy".  No palliative measures prior to arrival.   Past Medical History:  Diagnosis Date  . Acute blood loss anemia 11/06/2017  . Acute post-operative pain 11/03/2017  . Aneurysm of thoracic aorta (HCC) 10/30/2017  . Asthma   . Decreased activities of daily living (ADL) 11/06/2017  . Decreased strength, endurance, and mobility 11/04/2017  . Diabetes mellitus without complication (HCC)   . 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 DKA (diabetic ketoacidoses) (HCC) 10/04/2018  . Goiter 07/31/2012   U/s Jan 2014 with bilateral small cysts stable in size from December. U/s Jan 2014 with bilateral small cysts stable in size from December.  . 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 HTN (hypertension)   . Hyperlipidemia, unspecified 07/31/2012  . Insomnia 11/04/2017  . LVH (left ventricular hypertrophy) 11/06/2017  . Neuropathy   . OAB (overactive bladder) 01/20/2018  . PAD (peripheral artery disease) (HCC)   . Pleural effusion on left 11/01/2017  . Urinary retention 01/20/2018    Patient Active Problem List   Diagnosis Date Noted  . UTI (urinary tract infection) 10/04/2018  . DKA (diabetic  ketoacidoses) (HCC) 10/04/2018  . HTN (hypertension) 10/04/2018  . Asthma 10/04/2018  . Bimalleolar fracture, left, closed, initial encounter 07/21/2018  . Nondisplaced fracture of medial malleolus of left tibia 07/13/2018  . Urinary retention 01/20/2018  . OAB (overactive bladder) 01/20/2018  . 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 LVH (left ventricular hypertrophy) 11/06/2017  . Decreased activities of daily living (ADL) 11/06/2017  . Acute blood loss anemia 11/06/2017  . Insomnia 11/04/2017  . Decreased strength, endurance, and mobility 11/04/2017  . Acute post-operative pain 11/03/2017  . Pleural effusion on left 11/01/2017  . PAD (peripheral artery disease) (HCC) 10/31/2017  . 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 Aneurysm of thoracic aorta (HCC) 10/30/2017  . Hyperlipidemia, unspecified 07/31/2012  . Goiter 07/31/2012    Past Surgical History:  Procedure Laterality Date  . CARDIAC SURGERY      Prior to Admission medications   Medication Sig Start Date End Date Taking? Authorizing Provider  amLODipine (NORVASC) 5 MG tablet Take 5 mg by mouth daily. 05/13/16   [provider]  aspirin EC 81 MG tablet Take 81 mg by mouth daily. 11/13/17 11/13/18  [provider]  carvedilol (COREG) 6.25 MG tablet Take 6.2 mg by mouth every 12 (twelve) hours. 11/12/17 11/12/18  [provider]  cefdinir (OMNICEF) 300 MG capsule Take 1 capsule (300 mg total) by mouth every 12 (twelve) hours for 12 days. 10/12/18 10/24/18  Adrian SaranMody, Sital, MD  doxycycline (VIBRA-TABS) 100 MG tablet Take  1 tablet (100 mg total) by mouth every 12 (twelve) hours for 12 days. 10/12/18 10/24/18  Bettey Costa, MD  hydrochlorothiazide (HYDRODIURIL) 25 MG tablet Take 25 mg by mouth daily. 11/13/17 11/13/18  [provider]  insulin aspart protamine- aspart (NOVOLOG MIX 70/30) (70-30) 100 UNIT/ML injection Inject  0.27 mLs (27 Units total) into the skin 2 (two) times daily with a meal. 10/12/18   Bettey Costa, MD  magnesium oxide (MAG-OX) 400 MG tablet Take 800 mg by mouth 2 (two) times daily. 11/12/17 11/12/18  [provider]  Potassium Chloride ER 20 MEQ TBCR Take 20 mEq by mouth daily. 11/13/17   [provider]  traMADol (ULTRAM) 50 MG tablet Take 1 tablet (50 mg total) by mouth 2 (two) times daily. 10/19/18   Sable Feil, PA-C    Allergies Ace inhibitors, Contrast media [iodinated diagnostic agents], Iodine, Latex, Penicillins, Salicylates, and Shellfish allergy  No family history on file.  Social History Social History   Tobacco Use  . Smoking status: Never Smoker  . Smokeless tobacco: Never Used  Substance Use Topics  . Alcohol use: Never    Frequency: Never  . Drug use: Never    Review of Systems Constitutional: No fever/chills Eyes: No visual changes. ENT: No sore throat. Cardiovascular: Denies chest pain. Respiratory: Denies shortness of breath. Gastrointestinal: No abdominal pain.  No nausea, no vomiting.  No diarrhea.  No constipation. Genitourinary: Negative for dysuria.  Urinary retention. Musculoskeletal: Positive for right shoulder, right wrist, right knee pain. Skin: Negative for rash. Neurological: Negative for headaches, focal weakness or numbness. Endocrine:  Diabetes, hyperlipidemia, and hypertension. Allergic/Immunilogical: Medication list. ____________________________________________   PHYSICAL EXAM:  VITAL SIGNS: ED Triage Vitals  Enc Vitals Group     BP 10/19/18 0854 (!) 170/111     Pulse Rate 10/19/18 0854 93     Resp --      Temp 10/19/18 0854 98.4 F (36.9 C)     Temp Source 10/19/18 0854 Oral     SpO2 10/19/18 0854 98 %     Weight 10/19/18 0847 160 lb (72.6 kg)     Height 10/19/18 0847 5\' 3"  (1.6 m)     Head Circumference --      Peak Flow --      Pain Score 10/19/18 0847 3     Pain Loc --      Pain Edu? --      Excl. in [place]?  --     Constitutional: Alert and oriented. Well appearing and in no acute distress. Eyes: Conjunctivae are normal. PERRL. EOMI. Head: Atraumatic. Nose: No congestion/rhinnorhea. Mouth/Throat: Mucous membranes are moist.  Oropharynx non-erythematous. Neck: No stridor.  No cervical spine tenderness to palpation. Hematological/Lymphatic/Immunilogical: No cervical lymphadenopathy. Cardiovascular: Normal rate, regular rhythm. Grossly normal heart sounds.  Good peripheral circulation.  Elevated blood pressure. Respiratory: Normal respiratory effort.  No retractions. Lungs CTAB. Gastrointestinal: Soft and nontender. No distention. No abdominal bruits. No CVA tenderness. Genitourinary: Deferred Musculoskeletal: No lower extremity tenderness nor edema.  No joint effusions. Neurologic:  Normal speech and language. No gross focal neurologic deficits are appreciated. No gait instability. Skin:  Skin is warm, dry and intact. No rash noted. Psychiatric: Mood and affect are normal. Speech and behavior are normal.  ____________________________________________   LABS (all labs ordered are listed, but only abnormal results are displayed)  Labs Reviewed - No data to display ____________________________________________  EKG   ____________________________________________  RADIOLOGY  ED MD interpretation:    Official  radiology report(s): Dg Shoulder Right  Result Date: 10/19/2018 CLINICAL DATA:  Fall. EXAM: RIGHT SHOULDER - 2+ VIEW COMPARISON:  No recent. FINDINGS: Acromioclavicular glenohumeral degenerative change. No evidence of fracture or dislocation. Aortic stent graft noted. IMPRESSION: Acromioclavicular glenohumeral degenerative change. No acute abnormality. Electronically Signed   By: Maisie Fushomas  Register   On: 10/19/2018 09:42   Dg Wrist Complete Right  Result Date: 10/19/2018 CLINICAL DATA:  Right wrist pain after a fall. EXAM: RIGHT WRIST - COMPLETE 3+ VIEW COMPARISON:  None. FINDINGS: No  acute fracture or dislocation is identified. Mild radiocarpal degenerative changes are noted with a 3 mm lucency in the radial styloid likely reflecting a subchondral cyst and with degenerative appearing cortical irregularity involving the adjacent scaphoid. Mild soft tissue swelling is noted about the wrist. IMPRESSION: No acute osseous abnormality identified. Electronically Signed   By: Sebastian AcheAllen  Grady M.D.   On: 10/19/2018 09:43   Dg Knee 2 Views Right  Result Date: 10/19/2018 CLINICAL DATA:  Pain after fall. EXAM: RIGHT KNEE - 1-2 VIEW COMPARISON:  No prior. FINDINGS: Tricompartment degenerative change. No evidence of fracture or dislocation. IMPRESSION: Tricompartment degenerative change.  No acute abnormality. Electronically Signed   By: Maisie Fushomas  Register   On: 10/19/2018 09:43    ____________________________________________   PROCEDURES  Procedure(s) performed (including Critical Care):  Procedures   ____________________________________________   INITIAL IMPRESSION / ASSESSMENT AND PLAN / ED COURSE  As part of my medical decision making, I reviewed the following data within the electronic MEDICAL RECORD NUMBER         Patient presents with right side musculoskeletal pain involving the shoulder, wrist, and knee.  Pain is secondary to a fall.  Gust x-ray findings with patient moderate degenerative changes throughout the shoulder, wrist, and knee.  Patient with patient wrist splint and given discharge care instructions.  Patient advised take pain medication as directed.  Patient advised continue previous medications.  Patient advised follow-up PCP in 3 days if no improvements.      ____________________________________________   FINAL CLINICAL IMPRESSION(S) / ED DIAGNOSES  Final diagnoses:  Acute pain of right shoulder  Sprain of right wrist, initial encounter  Acute pain of right knee     ED Discharge Orders         Ordered    traMADol (ULTRAM) 50 MG tablet  2 times daily      10/19/18 1007           Note:  This document was prepared using Dragon voice recognition software and may include unintentional dictation errors.    Joni ReiningSmith, Ronald K, PA-C 10/19/18 1014    Willy Eddyobinson, Patrick, MD 10/19/18 1220

## 2018-10-19 NOTE — Progress Notes (Signed)
10/19/2018 8:38 AM   Ardine BjorkJanie Tran Aug 13, 1950 [id number]  Referring provider: No referring provider defined for this encounter.  Chief Complaint  Patient presents with  . Urinary Retention    New Patient    HPI: [facility].  1 day after aneurysm surgery she had retention.  800 mL was obtained.  She has had urodynamics.  She has mixed incontinence.  It appears when she had urodynamics she voided often involuntary contraction.  She went to the emergency room leaking around her catheter here locally.  Urine culture was -October 16, 2018.  Details of the history were difficult.  It appears that her flow was reasonable and she feels empty.  The catheter is been out for a few days.  She can get up many times at night and now she is using pads at night while she sleeping.  She is pad free during the day and voids about every 2 hours.  She was a started on insulin.  No issues with constipation.  No other neurologic issues.  She said she had a bladder infection at [facility] but otherwise does not get infections and does not had bladder surgery  Scan today for 48 mL  Modifying factors: There are no other modifying factors  Associated signs and symptoms: There are no other associated signs and symptoms Aggravating and relieving factors: There are no other aggravating or relieving factors Severity: Moderate Duration: Persistent       PMH: Past Medical History:  Diagnosis Date  . Acute blood loss anemia 11/06/2017  . Acute post-operative pain 11/03/2017  . Aneurysm of thoracic aorta (HCC) 10/30/2017  . Asthma   . Decreased activities of daily living (ADL) 11/06/2017  . Decreased strength, endurance, and mobility 11/04/2017  . Diabetes mellitus without complication (HCC)   . 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 DKA (diabetic ketoacidoses) (HCC) 10/04/2018  . Goiter 07/31/2012   U/s Jan 2014 with bilateral small cysts stable in size  from December. U/s Jan 2014 with bilateral small cysts stable in size from December.  . 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 HTN (hypertension)   . Hyperlipidemia, unspecified 07/31/2012  . Insomnia 11/04/2017  . LVH (left ventricular hypertrophy) 11/06/2017  . Neuropathy   . OAB (overactive bladder) 01/20/2018  . PAD (peripheral artery disease) (HCC)   . Pleural effusion on left 11/01/2017  . Urinary retention 01/20/2018    Surgical History: Past Surgical History:  Procedure Laterality Date  . CARDIAC SURGERY      Home Medications:  Allergies as of 10/19/2018      Reactions   Ace Inhibitors Hives, Swelling   Had angioedema with lisinopril. [facility].  Had angioedema with lisinopril. [facility].    Contrast Media [iodinated Diagnostic Agents]    Iodine Swelling   Latex Hives, Swelling   Penicillins Hives   Salicylates Hives   Shellfish Allergy Swelling      Medication List       Accurate as of October 19, 2018  8:38 AM. If you have any questions, ask your nurse or doctor.        amLODipine 5 MG tablet Commonly known as: NORVASC Take 5 mg by mouth daily.   aspirin EC 81 MG tablet Take 81 mg by mouth daily.   carvedilol 6.25 MG tablet Commonly known as: COREG Take 6.2 mg  by mouth every 12 (twelve) hours.   cefdinir 300 MG capsule Commonly known as: OMNICEF Take 1 capsule (300 mg total) by mouth every 12 (twelve) hours for 12 days.   doxycycline 100 MG tablet Commonly known as: VIBRA-TABS Take 1 tablet (100 mg total) by mouth every 12 (twelve) hours for 12 days.   hydrochlorothiazide 25 MG tablet Commonly known as: HYDRODIURIL Take 25 mg by mouth daily.   insulin aspart protamine- aspart (70-30) 100 UNIT/ML injection Commonly known as: NOVOLOG MIX 70/30 Inject 0.27 mLs (27 Units total) into the skin 2 (two) times daily with a meal.   magnesium oxide 400 MG tablet Commonly  known as: MAG-OX Take 800 mg by mouth 2 (two) times daily.   Potassium Chloride ER 20 MEQ Tbcr Take 20 mEq by mouth daily.   traMADol 50 MG tablet Commonly known as: ULTRAM Take 1 tablet (50 mg total) by mouth every 6 (six) hours as needed for moderate pain.       Allergies:  Allergies  Allergen Reactions  . Ace Inhibitors Hives and Swelling    Had angioedema with lisinopril. Seen in [facility] ED for this.  Had angioedema with lisinopril. Seen in [facility] ED for this.    . Contrast Media [Iodinated Diagnostic Agents]   . Iodine Swelling  . Latex Hives and Swelling  . Penicillins Hives  . Salicylates Hives  . Shellfish Allergy Swelling    Family History: No family history on file.  Social History:  reports that she has never smoked. She has never used smokeless tobacco. She reports that she does not drink alcohol or use drugs.  ROS: UROLOGY Frequent Urination?: No Hard to postpone urination?: No Burning/pain with urination?: No Get up at night to urinate?: Yes Leakage of urine?: Yes Urine stream starts and stops?: No Trouble starting stream?: No Do you have to strain to urinate?: No Blood in urine?: No Urinary tract infection?: No Sexually transmitted disease?: No Injury to kidneys or bladder?: No Painful intercourse?: No Weak stream?: No Currently pregnant?: No Vaginal bleeding?: No Last menstrual period?: n  Gastrointestinal Nausea?: No Vomiting?: No Indigestion/heartburn?: No Diarrhea?: No Constipation?: No  Constitutional Fever: No Night sweats?: No Weight loss?: No Fatigue?: No  Skin Skin rash/lesions?: No Itching?: No  Eyes Blurred vision?: No Double vision?: No  Ears/Nose/Throat Sore throat?: No Sinus problems?: No  Hematologic/Lymphatic Swollen glands?: No Easy bruising?: No  Cardiovascular Leg swelling?: No Chest pain?: No  Respiratory Cough?: No Shortness of breath?: No  Endocrine Excessive thirst?: No   Musculoskeletal Back pain?: No Joint pain?: No  Neurological Headaches?: No Dizziness?: No  Psychologic Depression?: No Anxiety?: No  Physical Exam: BP (!) 152/119   Pulse 96   Ht 5\' 3"  (1.6 m)   Wt 160 lb (72.6 kg)   BMI 28.34 kg/m   Constitutional:  Alert and oriented, No acute distress. HEENT: [address] AT, moist mucus membranes.  Trachea midline, no masses. Cardiovascular: No clubbing, cyanosis, or edema. Respiratory: Normal respiratory effort, no increased work of breathing. GI: Abdomen is soft, nontender, nondistended, no abdominal masses GU: No CVA tenderness.  No bladder tenderness Skin: No rashes, bruises or suspicious lesions. Lymph: No cervical or inguinal adenopathy. Neurologic: Grossly intact, no focal deficits, moving all 4 extremities. Psychiatric: Normal mood and affect.  Laboratory Data: Lab Results  Component Value Date   WBC 14.0 (H) 10/12/2018   HGB 11.9 (L) 10/12/2018   HCT 36.1 10/12/2018   MCV 89.1 10/12/2018   PLT 337 10/12/2018  Lab Results  Component Value Date   CREATININE 0.53 10/12/2018    No results found for: PSA  No results found for: TESTOSTERONE  Lab Results  Component Value Date   HGBA1C 12.9 (H) 10/05/2018    Urinalysis    Component Value Date/Time   COLORURINE YELLOW (A) 10/16/2018 1057   APPEARANCEUR CLOUDY (A) 10/16/2018 1057   LABSPEC 1.011 10/16/2018 1057   PHURINE 8.0 10/16/2018 1057   GLUCOSEU NEGATIVE 10/16/2018 1057   HGBUR MODERATE (A) 10/16/2018 1057   BILIRUBINUR NEGATIVE 10/16/2018 1057   KETONESUR NEGATIVE 10/16/2018 1057   PROTEINUR 100 (A) 10/16/2018 1057   NITRITE NEGATIVE 10/16/2018 1057   LEUKOCYTESUR LARGE (A) 10/16/2018 1057    Pertinent Imaging:   Assessment & Plan: Patient had a successful trial of voiding.  She may have a nocturnal diuresis based upon her previous surgery.  I do not think she should have an overactive bladder medication with her recent history of retention.  Hopefully her  nighttime issues will settle down.  We will see her back in 6 or 7 weeks and check another residual.  There are no diagnoses linked to this encounter.  Return in about 3 months (around 01/19/2019) for PVR.  Martina SinnerScott A Jensine Luz, MD  [facility] [address], [address] [place], [place] [address] [phone]

## 2018-10-19 NOTE — Discharge Instructions (Addendum)
Wear wrist splint for 3 to 5 days as needed.  Follow discharge care instruction take medication as directed.

## 2018-10-19 NOTE — ED Notes (Signed)
See triage note  Presents s/p fall  States she was going for an appt  Lost her balance and fell  Landed on right side  Having pain to right shoulder,knee and wrist

## 2018-10-19 NOTE — ED Triage Notes (Signed)
Pt in via Julie Tran. Per [facility] staff, pt was there for an appointment and she fell. Pt states 3 staff members tried to catch her but she still fell. Pt c/o pain to right shoulder, wrist and knee.

## 2018-10-27 ENCOUNTER — Ambulatory Visit: Payer: Medicare Other

## 2018-11-13 ENCOUNTER — Other Ambulatory Visit: Payer: Self-pay

## 2018-11-13 ENCOUNTER — Ambulatory Visit (INDEPENDENT_AMBULATORY_CARE_PROVIDER_SITE_OTHER): Payer: Medicare Other | Admitting: Nurse Practitioner

## 2018-11-13 VITALS — Ht 63.25 in | Wt 162.0 lb

## 2018-11-13 DIAGNOSIS — E876 Hypokalemia: Secondary | ICD-10-CM | POA: Insufficient documentation

## 2018-11-13 DIAGNOSIS — E785 Hyperlipidemia, unspecified: Secondary | ICD-10-CM

## 2018-11-13 DIAGNOSIS — E1140 Type 2 diabetes mellitus with diabetic neuropathy, unspecified: Secondary | ICD-10-CM | POA: Diagnosis not present

## 2018-11-13 DIAGNOSIS — E1169 Type 2 diabetes mellitus with other specified complication: Secondary | ICD-10-CM

## 2018-11-13 DIAGNOSIS — I10 Essential (primary) hypertension: Secondary | ICD-10-CM

## 2018-11-13 DIAGNOSIS — E1159 Type 2 diabetes mellitus with other circulatory complications: Secondary | ICD-10-CM

## 2018-11-13 DIAGNOSIS — I152 Hypertension secondary to endocrine disorders: Secondary | ICD-10-CM

## 2018-11-13 MED ORDER — CARVEDILOL 6.25 MG PO TABS: 6.2000 mg | ORAL_TABLET | Freq: Two times a day (BID) | ORAL | 11 refills | Status: DC

## 2018-11-13 MED ORDER — AMLODIPINE BESYLATE 5 MG PO TABS: 5.0000 mg | ORAL_TABLET | Freq: Every day | ORAL | 2 refills | Status: DC

## 2018-11-13 MED ORDER — HYDROCHLOROTHIAZIDE 25 MG PO TABS: 25.0000 mg | ORAL_TABLET | Freq: Every day | ORAL | 11 refills | Status: DC

## 2018-11-13 NOTE — Assessment & Plan Note (Signed)
Recheck K+ on BMP in office next week.  Adjust BP medications as needed, is on HCTZ.  Recommend increasing K+ rich foods in diet, such as bananas.

## 2018-11-13 NOTE — Progress Notes (Signed)
New Patient Office Visit  Subjective:  Patient ID: Julie Tran, female    DOB: August 10, 1950  Age: 68 y.o. MRN: [id number]  CC:  Chief Complaint  Patient presents with  . Establish Care  . Referral    Patient would like to see a diabetic doctor. Patient states she doesn't have any education on diabetes. She doesn't know how to check sugar, etc. Patient would like to take a diabetic class.    . This visit was completed via Doximity due to the restrictions of the COVID-19 pandemic. All issues as above were discussed and addressed. Physical exam was done as above through visual confirmation on Doximity. If it was felt that the patient should be evaluated in the office, they were directed there. The patient verbally consented to this visit. . Location of the patient: home . Location of the provider: home . Those involved with this call:  . Provider: Aura DialsJolene Cannady, DNP . CMA: Myrtha MantisKeri Bullock, CMA . Front Desk/Registration: Harriet PhoJoliza Johnson  . Time spent on call: 15 minutes with patient face to face via video conference. More than 50% of this time was spent in counseling and coordination of care. 10 minutes total spent in review of patient's record and preparation of their chart. I verified patient identity using two factors (patient name and date of birth). Patient consents verbally to being seen via telemedicine visit today.   HPI Julie BjorkJanie Kerchner presents for new patient visit to establish care.  Introduced to Publishing rights managernurse practitioner role and practice setting.  All questions answered.  DIABETES Diagnosed a few years ago.  Last A1C 12.9 on 10/05/18.  Insulin 70/30 current treatment = 27 units BID started in June 2019.  Has been on Metformin, by a PCP in [place].  Then went to hospital recently and was placed back on insulin.  She reports frustration as she does not wish to be on insulin and reports at night sometimes she wakes up and has to eat something because her "sugars are low I can feel it, I am  dizzy and stuff".  Would like diabetic classes, reports she has never attended these and that she does not know how to use glucometer so has not been checking sugars.  Educated her on CCM team in office and she is very interested in this program. Hypoglycemic episodes:not checking blood sugar Polydipsia/polyuria: no Visual disturbance: no Chest pain: no Paresthesias: no Glucose Monitoring: no  Accucheck frequency: Not Checking  Fasting glucose:  Post prandial:  Evening:  Before meals: Taking Insulin?: yes  Long acting insulin:  Short acting insulin: Blood Pressure Monitoring: not checking Retinal Examination: Up to Date, is scheduled again in August Foot Exam: Not up to Date Diabetic Education: Not Completed Pneumovax: Not up to Date Influenza: Up to Date Aspirin: yes   HYPERTENSION / HYPERLIPIDEMIA Current medications include Amlodipine, HCTZ, and Carvedilol.  No current statin use.  Reports she has not been on BP medication since her recently hospital visit as she was not told to restart.  Recent BMP showed slightly low K+ 3.2, appears to have been on supplement in past. Satisfied with current treatment? yes Duration of hypertension: chronic BP monitoring frequency: not checking BP range:  BP medication side effects: no Past BP meds: Lisinopril caused angioedema Duration of hyperlipidemia: chronic Cholesterol medication side effects: no, no current statin Cholesterol supplements: none Past cholesterol medications: none Medication compliance: good compliance Aspirin: yes Recent stressors: no Recurrent headaches: no Visual changes: no Palpitations: no Dyspnea: no Chest pain: no  Lower extremity edema: yes Dizzy/lightheaded: no  Past Medical History:  Diagnosis Date  . Acute blood loss anemia 11/06/2017  . Acute delirium   . Acute post-operative pain 11/03/2017  . AKI (acute kidney injury) ([place])   . Aneurysm of thoracic aorta ([place]) 10/30/2017  . Asthma   . Decreased  activities of daily living (ADL) 11/06/2017  . Decreased strength, endurance, and mobility 11/04/2017  . Diabetes mellitus without complication ([place])   . 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 DKA (diabetic ketoacidoses) ([place]) 10/04/2018  . Goiter 07/31/2012   U/s Jan 2014 with bilateral small cysts stable in size from December. U/s Jan 2014 with bilateral small cysts stable in size from December.  Marland Kitchen HHNC (hyperglycemic hyperosmolar nonketotic coma) ([place])   . 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 HTN (hypertension)   . Hyperlipidemia, unspecified 07/31/2012  . Insomnia 11/04/2017  . LVH (left ventricular hypertrophy) 11/06/2017  . Neuropathy   . OAB (overactive bladder) 01/20/2018  . PAD (peripheral artery disease) ([place])   . Pleural effusion on left 11/01/2017  . Urinary retention 01/20/2018    Past Surgical History:  Procedure Laterality Date  . ABDOMINAL AORTIC ANEURYSM REPAIR    . CARDIAC SURGERY    . Eye Surgery    . PR OPEN TREATMENT BIMALLEOLAR ANKLE FRACTURE    . SHOULDER ARTHROSCOPY    . THROMBOENDARTERECTOMY FEMORAL ARTERY      Family History  Problem Relation Age of Onset  . Diabetes Sister     Social History   Socioeconomic History  . Marital status: Widowed    Spouse name: Not on file  . Number of children: Not on file  . Years of education: Not on file  . Highest education level: Not on file  Occupational History  . Not on file  Social Needs  . Financial resource strain: Not very hard  . Food insecurity    Worry: Patient refused    Inability: Patient refused  . Transportation needs    Medical: Patient refused    Non-medical: Patient refused  Tobacco Use  . Smoking status: Never Smoker  . Smokeless tobacco: Never Used  Substance and Sexual Activity  . Alcohol use: Never    Frequency: Never  . Drug use: Never  . Sexual activity: Not Currently  Lifestyle  . Physical  activity    Days per week: Patient refused    Minutes per session: Patient refused  . Stress: Only a little  Relationships  . Social Herbalist on phone: Patient refused    Gets together: Patient refused    Attends religious service: Patient refused    Active member of club or organization: Patient refused    Attends meetings of clubs or organizations: Patient refused    Relationship status: Patient refused  . Intimate partner violence    Fear of current or ex partner: Patient refused    Emotionally abused: Patient refused    Physically abused: Patient refused    Forced sexual activity: Patient refused  Other Topics Concern  . Not on file  Social History Narrative  . Not on file    ROS Review of Systems  Constitutional: Negative for activity change, appetite change, diaphoresis, fatigue and fever.  Respiratory: Negative for cough, chest tightness and shortness of breath.   Cardiovascular: Negative for chest pain, palpitations and leg swelling.  Gastrointestinal: Negative for abdominal distention, abdominal pain,  constipation, diarrhea, nausea and vomiting.  Endocrine: Negative for cold intolerance, heat intolerance, polydipsia, polyphagia and polyuria.  Neurological: Negative for dizziness, syncope, weakness, light-headedness, numbness and headaches.  Psychiatric/Behavioral: Negative.     Objective:   Today's Vitals: Ht 5' 3.25" (1.607 m)   Wt 162 lb (73.5 kg)   BMI 28.47 kg/m   Physical Exam Vitals signs and nursing note reviewed.  Constitutional:      General: She is awake. She is not in acute distress.    Appearance: She is well-developed. She is not ill-appearing.  HENT:     Head: Normocephalic.     Right Ear: Hearing normal.     Left Ear: Hearing normal.  Eyes:     General: Lids are normal.        Right eye: No discharge.        Left eye: No discharge.     Conjunctiva/sclera: Conjunctivae normal.  Neck:     Musculoskeletal: Normal range of  motion.  Cardiovascular:     Comments: Unable to auscultate due to virtual exam only  Pulmonary:     Effort: Pulmonary effort is normal. No accessory muscle usage or respiratory distress.     Comments: Unable to auscultate due to virtual exam only  Neurological:     Mental Status: She is alert and oriented to person, place, and time.  Psychiatric:        Attention and Perception: Attention normal.        Mood and Affect: Mood normal.        Behavior: Behavior normal. Behavior is cooperative.        Thought Content: Thought content normal.        Judgment: Judgment normal.     Assessment & Plan:   Problem List Items Addressed This Visit      Cardiovascular and Mediastinum   Hypertension associated with diabetes (HCC)    Chronic, ongoing.  Continue current medication regimen.  Check labs at in office visit.  Recommend increasing K+ foods in diet, such as bananas.  Check labs at in office visit.      Relevant Medications   hydrochlorothiazide (HYDRODIURIL) 25 MG tablet   amLODipine (NORVASC) 5 MG tablet   carvedilol (COREG) 6.25 MG tablet     Endocrine   Hyperlipidemia associated with type 2 diabetes mellitus (HCC)    Chronic, ongoing with no current statin.  Check lipid panel at in office visit.      Type 2 diabetes mellitus with diabetic neuropathy, without long-term current use of insulin (HCC) - Primary    Chronic, ongoing with recent A1C 12.9%.  Currently on 70/30, but does not wish to be on insulin and has been on Metformin in past.  Will bring her into office next week for face to face.  May be appropriate to restart Metformin or alternate options, such as GLP --- recent CRT 0.53 and GFR >60.  Appropriate to trial oral or weekly injection vs insulin due to risk for hypoglycemia. CCM referral and diabetic education referral place. Continue PRN Gabapentin and check B12 level.      Relevant Orders   Ambulatory referral to Chronic Care Management Services   Ambulatory  referral to diabetic education     Other   Hypokalemia    Recheck K+ on BMP in office next week.  Adjust BP medications as needed, is on HCTZ.  Recommend increasing K+ rich foods in diet, such as bananas.  Outpatient Encounter Medications as of 11/13/2018  Medication Sig  . amLODipine (NORVASC) 5 MG tablet Take 1 tablet (5 mg total) by mouth daily.  Marland Kitchen. aspirin EC 81 MG tablet Take 81 mg by mouth daily.  . carvedilol (COREG) 6.25 MG tablet Take 1 tablet (6.2 mg total) by mouth every 12 (twelve) hours.  . gabapentin (NEURONTIN) 600 MG tablet Take 600 mg by mouth 2 (two) times daily as needed.  . hydrochlorothiazide (HYDRODIURIL) 25 MG tablet Take 1 tablet (25 mg total) by mouth daily.  . insulin aspart protamine- aspart (NOVOLOG MIX 70/30) (70-30) 100 UNIT/ML injection Inject 0.27 mLs (27 Units total) into the skin 2 (two) times daily with a meal.  . Potassium Chloride ER 20 MEQ TBCR Take 20 mEq by mouth daily.  . [DISCONTINUED] amLODipine (NORVASC) 5 MG tablet Take 5 mg by mouth daily.  . [DISCONTINUED] carvedilol (COREG) 6.25 MG tablet Take 6.2 mg by mouth every 12 (twelve) hours.  . [DISCONTINUED] hydrochlorothiazide (HYDRODIURIL) 25 MG tablet Take 25 mg by mouth daily.  . [DISCONTINUED] traMADol (ULTRAM) 50 MG tablet Take 1 tablet (50 mg total) by mouth 2 (two) times daily. (Patient not taking: Reported on 11/13/2018)   No facility-administered encounter medications on file as of 11/13/2018.    I discussed the assessment and treatment plan with the patient. The patient was provided an opportunity to ask questions and all were answered. The patient agreed with the plan and demonstrated an understanding of the instructions.   The patient was advised to call back or seek an in-person evaluation if the symptoms worsen or if the condition fails to improve as anticipated.   I provided 30 minutes of time during this encounter.  Follow-up: Return in about 4 days (around 11/17/2018)  for In office for labs and T2DM with CCM team.   Marjie SkiffJOLENE T CANNADY, NP

## 2018-11-13 NOTE — Assessment & Plan Note (Signed)
Chronic, ongoing with no current statin.  Check lipid panel at in office visit.

## 2018-11-13 NOTE — Assessment & Plan Note (Signed)
Chronic, ongoing.  Continue current medication regimen.  Check labs at in office visit.  Recommend increasing K+ foods in diet, such as bananas.  Check labs at in office visit.

## 2018-11-13 NOTE — Assessment & Plan Note (Addendum)
Chronic, ongoing with recent A1C 12.9%.  Currently on 70/30, but does not wish to be on insulin and has been on Metformin in past.  Will bring her into office next week for face to face.  May be appropriate to restart Metformin or alternate options, such as GLP --- recent CRT 0.53 and GFR >60.  Appropriate to trial oral or weekly injection vs insulin due to risk for hypoglycemia. CCM referral and diabetic education referral place. Continue PRN Gabapentin and check B12 level.

## 2018-11-13 NOTE — Patient Instructions (Signed)
Carbohydrate Counting for Diabetes Mellitus, Adult  Carbohydrate counting is a method of keeping track of how many carbohydrates you eat. Eating carbohydrates naturally increases the amount of sugar (glucose) in the blood. Counting how many carbohydrates you eat helps keep your blood glucose within normal limits, which helps you manage your diabetes (diabetes mellitus). It is important to know how many carbohydrates you can safely have in each meal. This is different for every person. A diet and nutrition specialist (registered dietitian) can help you make a meal plan and calculate how many carbohydrates you should have at each meal and snack. Carbohydrates are found in the following foods:  Grains, such as breads and cereals.  Dried beans and soy products.  Starchy vegetables, such as potatoes, peas, and corn.  Fruit and fruit juices.  Milk and yogurt.  Sweets and snack foods, such as cake, cookies, candy, chips, and soft drinks. How do I count carbohydrates? There are two ways to count carbohydrates in food. You can use either of the methods or a combination of both. Reading "Nutrition Facts" on packaged food The "Nutrition Facts" list is included on the labels of almost all packaged foods and beverages in the [place] It includes:  The serving size.  Information about nutrients in each serving, including the grams (g) of carbohydrate per serving. To use the "Nutrition Facts":  Decide how many servings you will have.  Multiply the number of servings by the number of carbohydrates per serving.  The resulting number is the total amount of carbohydrates that you will be having. Learning standard serving sizes of other foods When you eat carbohydrate foods that are not packaged or do not include "Nutrition Facts" on the label, you need to measure the servings in order to count the amount of carbohydrates:  Measure the foods that you will eat with a food scale or measuring cup, if needed.   Decide how many standard-size servings you will eat.  Multiply the number of servings by 15. Most carbohydrate-rich foods have about 15 g of carbohydrates per serving. ? For example, if you eat 8 oz (170 g) of strawberries, you will have eaten 2 servings and 30 g of carbohydrates (2 servings x 15 g = 30 g).  For foods that have more than one food mixed, such as soups and casseroles, you must count the carbohydrates in each food that is included. The following list contains standard serving sizes of common carbohydrate-rich foods. Each of these servings has about 15 g of carbohydrates:   hamburger bun or  English muffin.   oz (15 mL) syrup.   oz (14 g) jelly.  1 slice of bread.  1 six-inch tortilla.  3 oz (85 g) cooked rice or pasta.  4 oz (113 g) cooked dried beans.  4 oz (113 g) starchy vegetable, such as peas, corn, or potatoes.  4 oz (113 g) hot cereal.  4 oz (113 g) mashed potatoes or  of a large baked potato.  4 oz (113 g) canned or frozen fruit.  4 oz (120 mL) fruit juice.  4-6 crackers.  6 chicken nuggets.  6 oz (170 g) unsweetened dry cereal.  6 oz (170 g) plain fat-free yogurt or yogurt sweetened with artificial sweeteners.  8 oz (240 mL) milk.  8 oz (170 g) fresh fruit or one small piece of fruit.  24 oz (680 g) popped popcorn. Example of carbohydrate counting Sample meal  3 oz (85 g) chicken breast.  6 oz (170 g)   brown rice.  4 oz (113 g) corn.  8 oz (240 mL) milk.  8 oz (170 g) strawberries with sugar-free whipped topping. Carbohydrate calculation 1. Identify the foods that contain carbohydrates: ? Rice. ? Corn. ? Milk. ? Strawberries. 2. Calculate how many servings you have of each food: ? 2 servings rice. ? 1 serving corn. ? 1 serving milk. ? 1 serving strawberries. 3. Multiply each number of servings by 15 g: ? 2 servings rice x 15 g = 30 g. ? 1 serving corn x 15 g = 15 g. ? 1 serving milk x 15 g = 15 g. ? 1 serving  strawberries x 15 g = 15 g. 4. Add together all of the amounts to find the total grams of carbohydrates eaten: ? 30 g + 15 g + 15 g + 15 g = 75 g of carbohydrates total. Summary  Carbohydrate counting is a method of keeping track of how many carbohydrates you eat.  Eating carbohydrates naturally increases the amount of sugar (glucose) in the blood.  Counting how many carbohydrates you eat helps keep your blood glucose within normal limits, which helps you manage your diabetes.  A diet and nutrition specialist (registered dietitian) can help you make a meal plan and calculate how many carbohydrates you should have at each meal and snack. This information is not intended to replace advice given to you by your health care provider. Make sure you discuss any questions you have with your health care provider. Document Released: 04/22/2005 Document Revised: 11/14/2016 Document Reviewed: 10/04/2015 Elsevier Patient Education  2020 Elsevier Inc.  

## 2018-11-17 ENCOUNTER — Other Ambulatory Visit: Payer: Self-pay

## 2018-11-17 ENCOUNTER — Ambulatory Visit (INDEPENDENT_AMBULATORY_CARE_PROVIDER_SITE_OTHER): Payer: Medicare Other | Admitting: Nurse Practitioner

## 2018-11-17 ENCOUNTER — Ambulatory Visit: Payer: Medicare Other | Admitting: *Deleted

## 2018-11-17 ENCOUNTER — Encounter: Payer: Self-pay | Admitting: Nurse Practitioner

## 2018-11-17 DIAGNOSIS — I10 Essential (primary) hypertension: Secondary | ICD-10-CM | POA: Diagnosis not present

## 2018-11-17 DIAGNOSIS — I152 Hypertension secondary to endocrine disorders: Secondary | ICD-10-CM

## 2018-11-17 DIAGNOSIS — E1159 Type 2 diabetes mellitus with other circulatory complications: Secondary | ICD-10-CM

## 2018-11-17 DIAGNOSIS — E1140 Type 2 diabetes mellitus with diabetic neuropathy, unspecified: Secondary | ICD-10-CM | POA: Diagnosis not present

## 2018-11-17 NOTE — Assessment & Plan Note (Signed)
Chronic, ongoing with recent A1C on chart 12.9%.  Continue current medication regimen at this time.  Is scheduled for diabetic education on Monday and aware to take all medication and glucometer with her (she reports she has all her supplies).  Will meet with her on Tuesday along with CCM team and develop POC with patient for diabetes treatment.  Consider restarting Metformin or adding GLP.

## 2018-11-17 NOTE — Assessment & Plan Note (Signed)
Chronic, ongoing with initial BP elevated and repeat improved but still elevated above goal.  Recommend to continue current medication regimen, Amlodipine and Coreg, she does not wish to take diuretic HCTZ will see how she does with Amlodipine and Coreg.

## 2018-11-17 NOTE — Patient Instructions (Signed)
Thank you allowing the Chronic Care Management Team to be a part of your care! It was a pleasure speaking with you today!   CCM (Chronic Care Management) Team   Zaylon Bossier RN, BSN Nurse Care Coordinator  [phone]  Catie [facility] PharmD  Clinical Pharmacist  [phone]  Eula Fried LCSW Clinical Social Worker [phone]  Goals Addressed            This Visit's Progress   . I need to get some education about my diabetes (pt-stated)       Current Barriers:  Marland Kitchen Knowledge Deficits related to basic Diabetes pathophysiology and self care/management . Knowledge Deficits related to medications used for management of diabetes . Does not use cbg meter -Patient has meter unsure how to use per patient  Case Manager Clinical Goal(s):  Over the next 120 days, patient will demonstrate improved adherence to prescribed treatment plan for diabetes self care/management as evidenced by:  . daily monitoring and recording of CBG  . adherence to ADA/ carb modified diet . adherence to prescribed medication regimen  Interventions:  . Discussed plans with patient for ongoing care management follow up and provided patient with direct contact information for care management team . Listened to patient's concerns recent A1C and recent hospital encounter.  . Placed a call to [facility] to assist in getting patient scheduled related to they have been unable to get in touch with patient, had to leave a message requesting a return call. [phone] . Discussed with patient upcoming PCP visit today and my plan to call her after.  . Return call received from Milton at the [facility], she was able to schedule the patient for Diabetes Education Class Monday July 20 at 3pm. The address is [place], (in the white building beside the [employer] across from [facility] hospital) They are located on the 2nd floor in [address].  . Collaboration with PCP . Placed a call back to  patient per patient's earlier request, reinforced the appointment with the [facility], explained exactly where the class will be held.Texted patient the phone number, class time and address of class.  . Plan to meet with patient next week at provider office.   Patient Self Care Activities:  . UNABLE to independently to manage diabetes as evidenced by recent A1C of 12.9%  Initial goal documentation        The patient verbalized understanding of instructions provided today and declined a print copy of patient instruction materials.   The patient has been provided with contact information for the care management team and has been advised to call with any health related questions or concerns.

## 2018-11-17 NOTE — Chronic Care Management (AMB) (Signed)
Chronic Care Management   Initial Visit Note  11/17/2018 Name: Julie Tran MRN: [id number] DOB: 24-Nov-1950  Referred by: Venita Lick, NP Reason for referral : Chronic Care Management (Diabetes Education )   Julie Tran is a 68 y.o. year old female who is a primary care patient of Cannady, Barbaraann Faster, NP. The CCM team was consulted for assistance with chronic disease management and care coordination needs.   Review of patient status, including review of consultants reports, relevant laboratory and other test results, and collaboration with appropriate care team members and the patient's provider was performed as part of comprehensive patient evaluation and provision of chronic care management services.    SDOH (Social Determinants of Health) screening performed today. See Care Plan Entry related to challenges with: Stress /Diabetes education  Objective:   Goals Addressed            This Visit's Progress   . I need to get some education about my diabetes (pt-stated)       Current Barriers:  Marland Kitchen Knowledge Deficits related to basic Diabetes pathophysiology and self care/management . Knowledge Deficits related to medications used for management of diabetes . Does not use cbg meter -Patient has meter unsure how to use per patient  Case Manager Clinical Goal(s):  Over the next 120 days, patient will demonstrate improved adherence to prescribed treatment plan for diabetes self care/management as evidenced by:  . daily monitoring and recording of CBG  . adherence to ADA/ carb modified diet . adherence to prescribed medication regimen  Interventions:  . Discussed plans with patient for ongoing care management follow up and provided patient with direct contact information for care management team . Listened to patient's concerns recent A1C and recent hospital encounter.  . Placed a call to [facility] to assist in getting patient scheduled related to they have been unable to  get in touch with patient, had to leave a message requesting a return call. [phone] . Discussed with patient upcoming PCP visit today and my plan to call Julie Tran after.  . Return call received from [place] at the [facility], she was able to schedule the patient for Diabetes Education Class Monday July 20 at 3pm. The address is [place], (in the white building beside the [place] [employer] across from [facility]) They are located on the 2nd floor in [address].  . Collaboration with PCP . Placed a call back to patient per patient's earlier request, reinforced the appointment with the [facility], explained exactly where the class will be held.Texted patient the phone number, class time and address of class.  . Plan to meet with patient next week at provider office.   Patient Self Care Activities:  . UNABLE to independently to manage diabetes as evidenced by recent A1C of 12.9%  Initial goal documentation         Julie Tran was given information about Chronic Care Management services today including:  1. CCM service includes personalized support from designated clinical staff supervised by Julie Tran physician, including individualized plan of care and coordination with other care providers 2. 24/7 contact phone numbers for assistance for urgent and routine care needs. 3. Service will only be billed when office clinical staff spend 20 minutes or more in a month to coordinate care. 4. Only one practitioner may furnish and bill the service in a calendar month. 5. The patient may stop CCM services at any time (effective at the end of the month) by  phone call to the office staff. 6. The patient will be responsible for cost sharing (co-pay) of up to 20% of the service fee (after annual deductible is met).  Patient agreed to services and verbal consent obtained.   Plan:   The care management team will reach out to the patient again over the next 14 days.  The patient has  been provided with contact information for the care management team and has been advised to call with any health related questions or concerns.   Merlene Morse Kataleya Zaugg RN, BSN Nurse Case Editor, commissioning [facility]  [phone]) Business Mobile

## 2018-11-17 NOTE — Progress Notes (Signed)
BP (!) 146/82 (BP Location: Left Arm, Patient Position: Sitting)   Pulse 88 Comment: apical  Temp 99 F (37.2 C) (Oral)   SpO2 98%    Subjective:    Patient ID: Julie Tran, female    DOB: [birth year], 68 y.o.   MRN: [id number]  HPI: Julie Tran is a 68 y.o. female  Chief Complaint  Patient presents with  . Diabetes    pt states her eye exam is due in August   DIABETES Last A1C 12.9%.  Current treatment insulin 70/30 = 27 units BID started June 2019. Has been on Metformin in past, by a PCP in [place] started in 2009.  Does not wish to be on insulin and would like some education.  Has not been taught how to eat diabetic diet or how to check blood sugar at home.  Is scheduled for diabetic education July 20th at 3 PM and after this we plan on sitting with CCM team to discuss medication regimen as patient insistent she does not want to take insulin and she believes her A1C in hospital was "taken from her old labs" although discussed with her this was a new lab draw.  Has not been taking Amlodipine and Coreg, instructed to restart these.   Hypoglycemic episodes:no Polydipsia/polyuria: no Visual disturbance: no Chest pain: no Paresthesias: no Glucose Monitoring: yes  Accucheck frequency: Not Checking  Fasting glucose:  Post prandial:  Evening:  Before meals: Taking Insulin?: yes  Long acting insulin:   Short acting insulin: 70/30 27 units BID Blood Pressure Monitoring: not checking Retinal Examination: Not up to Date Foot Exam: Not up to Date Diabetic Education: Not Completed Pneumovax: Not up to Date Influenza: Up to Date Aspirin: no  Relevant past medical, surgical, family and social history reviewed and updated as indicated. Interim medical history since our last visit reviewed. Allergies and medications reviewed and updated.  Review of Systems  Constitutional: Negative for activity change, appetite change, diaphoresis, fatigue and fever.  Respiratory: Negative for  cough, chest tightness and shortness of breath.   Cardiovascular: Negative for chest pain, palpitations and leg swelling.  Gastrointestinal: Negative for abdominal distention, abdominal pain, constipation, diarrhea, nausea and vomiting.  Endocrine: Negative for cold intolerance, heat intolerance, polydipsia, polyphagia and polyuria.  Neurological: Negative for dizziness, syncope, weakness, light-headedness, numbness and headaches.  Psychiatric/Behavioral: Negative.     Per HPI unless specifically indicated above     Objective:    BP (!) 146/82 (BP Location: Left Arm, Patient Position: Sitting)   Pulse 88 Comment: apical  Temp 99 F (37.2 C) (Oral)   SpO2 98%   Wt Readings from Last 3 Encounters:  11/13/18 162 lb (73.5 kg)  10/19/18 160 lb (72.6 kg)  10/19/18 160 lb (72.6 kg)    Physical Exam Vitals signs and nursing note reviewed.  Constitutional:      General: She is awake. She is not in acute distress.    Appearance: She is well-developed. She is not ill-appearing.  HENT:     Head: Normocephalic.     Right Ear: Hearing normal.     Left Ear: Hearing normal.     Nose: Nose normal.     Mouth/Throat:     Mouth: Mucous membranes are moist.  Eyes:     General: Lids are normal.        Right eye: No discharge.        Left eye: No discharge.     Conjunctiva/sclera: Conjunctivae normal.  Pupils: Pupils are equal, round, and reactive to light.  Neck:     Musculoskeletal: Normal range of motion and neck supple.     Thyroid: No thyromegaly.     Vascular: No carotid bruit.  Cardiovascular:     Rate and Rhythm: Normal rate and regular rhythm.     Heart sounds: Normal heart sounds. No murmur. No gallop.   Pulmonary:     Effort: Pulmonary effort is normal. No accessory muscle usage or respiratory distress.     Breath sounds: Normal breath sounds.  Abdominal:     General: Bowel sounds are normal.     Palpations: Abdomen is soft. There is no hepatomegaly or splenomegaly.   Musculoskeletal:     Right lower leg: No edema.     Left lower leg: No edema.  Lymphadenopathy:     Cervical: No cervical adenopathy.  Skin:    General: Skin is warm and dry.  Neurological:     Mental Status: She is alert and oriented to person, place, and time.  Psychiatric:        Attention and Perception: Attention normal.        Mood and Affect: Mood normal.        Behavior: Behavior normal. Behavior is cooperative.        Thought Content: Thought content normal.        Judgment: Judgment normal.     Results for orders placed or performed in visit on 10/19/18  BLADDER SCAN AMB NON-IMAGING  Result Value Ref Range   Scan Result 48ml       Assessment & Plan:   Problem List Items Addressed This Visit      Cardiovascular and Mediastinum   Hypertension associated with diabetes (HCC)    Chronic, ongoing with initial BP elevated and repeat improved but still elevated above goal.  Recommend to continue current medication regimen, Amlodipine and Coreg, she does not wish to take diuretic HCTZ will see how she does with Amlodipine and Coreg.          Endocrine   Type 2 diabetes mellitus with diabetic neuropathy, without long-term current use of insulin (HCC)    Chronic, ongoing with recent A1C on chart 12.9%.  Continue current medication regimen at this time.  Is scheduled for diabetic education on Monday and aware to take all medication and glucometer with her (she reports she has all her supplies).  Will meet with her on Tuesday along with CCM team and develop POC with patient for diabetes treatment.  Consider restarting Metformin or adding GLP.          Follow up plan: Return in about 4 weeks (around 12/15/2018) for T2DM (30 minute please on Tuesday).

## 2018-11-17 NOTE — Patient Instructions (Signed)
Carbohydrate Counting for Diabetes Mellitus, Adult  Carbohydrate counting is a method of keeping track of how many carbohydrates you eat. Eating carbohydrates naturally increases the amount of sugar (glucose) in the blood. Counting how many carbohydrates you eat helps keep your blood glucose within normal limits, which helps you manage your diabetes (diabetes mellitus). It is important to know how many carbohydrates you can safely have in each meal. This is different for every person. A diet and nutrition specialist (registered dietitian) can help you make a meal plan and calculate how many carbohydrates you should have at each meal and snack. Carbohydrates are found in the following foods:  Grains, such as breads and cereals.  Dried beans and soy products.  Starchy vegetables, such as potatoes, peas, and corn.  Fruit and fruit juices.  Milk and yogurt.  Sweets and snack foods, such as cake, cookies, candy, chips, and soft drinks. How do I count carbohydrates? There are two ways to count carbohydrates in food. You can use either of the methods or a combination of both. Reading "Nutrition Facts" on packaged food The "Nutrition Facts" list is included on the labels of almost all packaged foods and beverages in the [place] It includes:  The serving size.  Information about nutrients in each serving, including the grams (g) of carbohydrate per serving. To use the "Nutrition Facts":  Decide how many servings you will have.  Multiply the number of servings by the number of carbohydrates per serving.  The resulting number is the total amount of carbohydrates that you will be having. Learning standard serving sizes of other foods When you eat carbohydrate foods that are not packaged or do not include "Nutrition Facts" on the label, you need to measure the servings in order to count the amount of carbohydrates:  Measure the foods that you will eat with a food scale or measuring cup, if needed.   Decide how many standard-size servings you will eat.  Multiply the number of servings by 15. Most carbohydrate-rich foods have about 15 g of carbohydrates per serving. ? For example, if you eat 8 oz (170 g) of strawberries, you will have eaten 2 servings and 30 g of carbohydrates (2 servings x 15 g = 30 g).  For foods that have more than one food mixed, such as soups and casseroles, you must count the carbohydrates in each food that is included. The following list contains standard serving sizes of common carbohydrate-rich foods. Each of these servings has about 15 g of carbohydrates:   hamburger bun or  English muffin.   oz (15 mL) syrup.   oz (14 g) jelly.  1 slice of bread.  1 six-inch tortilla.  3 oz (85 g) cooked rice or pasta.  4 oz (113 g) cooked dried beans.  4 oz (113 g) starchy vegetable, such as peas, corn, or potatoes.  4 oz (113 g) hot cereal.  4 oz (113 g) mashed potatoes or  of a large baked potato.  4 oz (113 g) canned or frozen fruit.  4 oz (120 mL) fruit juice.  4-6 crackers.  6 chicken nuggets.  6 oz (170 g) unsweetened dry cereal.  6 oz (170 g) plain fat-free yogurt or yogurt sweetened with artificial sweeteners.  8 oz (240 mL) milk.  8 oz (170 g) fresh fruit or one small piece of fruit.  24 oz (680 g) popped popcorn. Example of carbohydrate counting Sample meal  3 oz (85 g) chicken breast.  6 oz (170 g)   brown rice.  4 oz (113 g) corn.  8 oz (240 mL) milk.  8 oz (170 g) strawberries with sugar-free whipped topping. Carbohydrate calculation 1. Identify the foods that contain carbohydrates: ? Rice. ? Corn. ? Milk. ? Strawberries. 2. Calculate how many servings you have of each food: ? 2 servings rice. ? 1 serving corn. ? 1 serving milk. ? 1 serving strawberries. 3. Multiply each number of servings by 15 g: ? 2 servings rice x 15 g = 30 g. ? 1 serving corn x 15 g = 15 g. ? 1 serving milk x 15 g = 15 g. ? 1 serving  strawberries x 15 g = 15 g. 4. Add together all of the amounts to find the total grams of carbohydrates eaten: ? 30 g + 15 g + 15 g + 15 g = 75 g of carbohydrates total. Summary  Carbohydrate counting is a method of keeping track of how many carbohydrates you eat.  Eating carbohydrates naturally increases the amount of sugar (glucose) in the blood.  Counting how many carbohydrates you eat helps keep your blood glucose within normal limits, which helps you manage your diabetes.  A diet and nutrition specialist (registered dietitian) can help you make a meal plan and calculate how many carbohydrates you should have at each meal and snack. This information is not intended to replace advice given to you by your health care provider. Make sure you discuss any questions you have with your health care provider. Document Released: 04/22/2005 Document Revised: 11/14/2016 Document Reviewed: 10/04/2015 Elsevier Patient Education  2020 Elsevier Inc.  

## 2018-11-23 ENCOUNTER — Other Ambulatory Visit: Payer: Self-pay

## 2018-11-23 ENCOUNTER — Encounter: Payer: Medicare Other | Attending: Nurse Practitioner | Admitting: *Deleted

## 2018-11-23 ENCOUNTER — Encounter: Payer: Self-pay | Admitting: *Deleted

## 2018-11-23 VITALS — BP 110/60 | Ht 63.0 in | Wt 171.8 lb

## 2018-11-23 DIAGNOSIS — E1140 Type 2 diabetes mellitus with diabetic neuropathy, unspecified: Secondary | ICD-10-CM | POA: Diagnosis present

## 2018-11-23 DIAGNOSIS — E1165 Type 2 diabetes mellitus with hyperglycemia: Secondary | ICD-10-CM

## 2018-11-23 NOTE — Patient Instructions (Addendum)
Check blood sugars 2 x day before breakfast and 2 hrs after supper every day Bring blood sugar records to the next class  Exercise: Continue using foot pedal  for  30  minutes  2 days a week  Eat 3 meals day,  1-2  snacks a day Space meals 4-6 hours apart Don't skip meals  Return for classes on:

## 2018-11-24 ENCOUNTER — Ambulatory Visit: Payer: Self-pay | Admitting: Pharmacist

## 2018-11-24 ENCOUNTER — Ambulatory Visit: Payer: Self-pay | Admitting: Nurse Practitioner

## 2018-11-24 ENCOUNTER — Encounter: Payer: Self-pay | Admitting: Nurse Practitioner

## 2018-11-24 ENCOUNTER — Telehealth: Payer: Self-pay

## 2018-11-24 ENCOUNTER — Other Ambulatory Visit: Payer: Self-pay

## 2018-11-24 ENCOUNTER — Ambulatory Visit (INDEPENDENT_AMBULATORY_CARE_PROVIDER_SITE_OTHER): Payer: Medicare Other | Admitting: Nurse Practitioner

## 2018-11-24 DIAGNOSIS — E1140 Type 2 diabetes mellitus with diabetic neuropathy, unspecified: Secondary | ICD-10-CM

## 2018-11-24 MED ORDER — METFORMIN HCL 500 MG PO TABS: ORAL_TABLET | ORAL | 3 refills | Status: DC

## 2018-11-24 NOTE — Progress Notes (Signed)
Diabetes Self-Management Education  Visit Type: First/Initial  Appt. Start Time: 1550 Appt. End Time: 1705  11/24/2018  Ms. Julie Tran, identified by name and date of birth, is a 68 y.o. female with a diagnosis of Diabetes: Type 2.   ASSESSMENT  Blood pressure 110/60, height 5\' 3"  (1.6 m), weight 171 lb 12.8 oz (77.9 kg). Body mass index is 30.43 kg/m.  Diabetes Self-Management Education - 11/23/18 1709      Visit Information   Visit Type  First/Initial      Initial Visit   Diabetes Type  Type 2    Are you currently following a meal plan?  No    Are you taking your medications as prescribed?  Yes   refused insulin   Date Diagnosed  2009      Health Coping   How would you rate your overall health?  Fair      Psychosocial Assessment   Patient Belief/Attitude about Diabetes  Other (comment)   "okay - when I'm taking my medicine when scheduled"   Self-care barriers  None    Self-management support  Doctor's office    Patient Concerns  Nutrition/Meal planning;Healthy Lifestyle    Special Needs  None    Preferred Learning Style  Auditory;Visual;Hands on    Learning Readiness  Contemplating    How often do you need to have someone help you when you read instructions, pamphlets, or other written materials from your doctor or pharmacy?  1 - Never    What is the last grade level you completed in school?  Masters      Pre-Education Assessment   Patient understands the diabetes disease and treatment process.  Needs Instruction    Patient understands incorporating nutritional management into lifestyle.  Needs Instruction    Patient undertands incorporating physical activity into lifestyle.  Needs Instruction    Patient understands using medications safely.  Needs Instruction    Patient understands monitoring blood glucose, interpreting and using results  Needs Instruction    Patient understands prevention, detection, and treatment of acute complications.  Needs Instruction    Patient understands prevention, detection, and treatment of chronic complications.  Needs Instruction    Patient understands how to develop strategies to address psychosocial issues.  Needs Instruction    Patient understands how to develop strategies to promote health/change behavior.  Needs Instruction      Complications   Last HgB A1C per patient/outside source  12.9 %   10/05/2018   How often do you check your blood sugar?  0 times/day (not testing)   Pt brought her own meter (ReliON) and was instructed on use. BG upon return demonstration was 322 mg/dL at 9:624:50 pm - 2 hrs pp   Have you had a dilated eye exam in the past 12 months?  Yes    Have you had a dental exam in the past 12 months?  No    Are you checking your feet?  No      Dietary Intake   Breakfast  oatmeal with almond milk and brown sugar; cereal and almond milk    Snack (morning)  pt reports no snacks    Lunch  skips    Dinner  chicken, fish, [place] with bread, peas, beans, occasional brown rice, salads, broccoli, cauliflower, cabbage, greens    Beverage(s)  water, diet soda      Exercise   Exercise Type  ADL's      Patient Education   Previous Diabetes Education  No    Disease state   Factors that contribute to the development of diabetes;Explored patient's options for treatment of their diabetes    Nutrition management   Role of diet in the treatment of diabetes and the relationship between the three main macronutrients and blood glucose level;Reviewed blood glucose goals for pre and post meals and how to evaluate the patients' food intake on their blood glucose level.    Physical activity and exercise   Role of exercise on diabetes management, blood pressure control and cardiac health.    Monitoring  Taught/evaluated SMBG meter.;Purpose and frequency of SMBG.;Taught/discussed recording of test results and interpretation of SMBG.;Identified appropriate SMBG and/or A1C goals.    Chronic complications  Relationship between  chronic complications and blood glucose control    Psychosocial adjustment  Identified and addressed patients feelings and concerns about diabetes      Individualized Goals (developed by patient)   Reducing Risk Lead a healthier lifestyle     Outcomes   Expected Outcomes  Demonstrated interest in learning but doesn't agree with ADA guidelines.   Future DMSE  2 wks       Individualized Plan for Diabetes Self-Management Training:   Learning Objective:  Patient will have a greater understanding of diabetes self-management. Patient education plan is to attend individual and/or group sessions per assessed needs and concerns.   Plan:   Patient Instructions  Check blood sugars 2 x day before breakfast and 2 hrs after supper every day Bring blood sugar records to the next class Exercise: Continue using foot pedal  for  30  minutes  2 days a week Eat 3 meals day,  1-2  snacks a day Space meals 4-6 hours apart Don't skip meals  Expected Outcomes:  Demonstrated interest in learning. Expect positive outcomes  Education material provided:  General Meal Planning Guidelines Simple Meal Plan  If problems or questions, patient to contact team via:  Johny Drilling, RN, [place], CDE [phone]  Future DSME appointment: 2 wks  December 07, 2018 for Diabetes Class 1

## 2018-11-24 NOTE — Chronic Care Management (AMB) (Signed)
Chronic Care Management   Note  11/24/2018 Name: Julie Tran MRN: [id number] DOB: 1950/11/03   Subjective:  Julie Tran is a 68 y.o. year old female who is a primary care patient of Cannady, Barbaraann Faster, NP. The CCM team was consulted for assistance with chronic disease management and care coordination needs.    Met with patient face to face during provider appointment today.   Review of patient status, including review of consultants reports, laboratory and other test data, was performed as part of comprehensive evaluation and provision of chronic care management services.   Objective:  Lab Results  Component Value Date   CREATININE 0.53 10/12/2018   CREATININE 0.59 10/11/2018   CREATININE 0.49 10/10/2018    Lab Results  Component Value Date   HGBA1C 12.9 (H) 10/05/2018    BP Readings from Last 3 Encounters:  11/24/18 (!) 135/93  11/23/18 110/60  11/17/18 (!) 146/82    Allergies  Allergen Reactions  . Ace Inhibitors Hives and Swelling    Had angioedema with lisinopril. Seen in [facility] ED for this.  Had angioedema with lisinopril. Se[facility] this.    . Contrast Media [Iodinated Diagnostic Agents]   . Iodine Swelling  . Latex Hives and Swelling  . Penicillins Hives  . Salicylates Hives  . Shellfish Allergy Swelling    Medications Reviewed Today    Reviewed by Venita Lick, NP (Nurse Practitioner) on 11/24/18 at 1339  Med List Status: <None>  Medication Order Taking? Sig Documenting Provider Last Dose Status Informant  amLODipine (NORVASC) 5 MG tablet [id number] Yes Take 1 tablet (5 mg total) by mouth daily. Marnee Guarneri T, NP Taking Active   Calcium-Magnesium-Vitamin D (CALCIUM MAGNESIUM PO) [id number] Yes Take by mouth. 2 tablets in morning and 1 tablet in evening [provider] Taking Active   carvedilol (COREG) 6.25 MG tablet [id number] Yes Take 1 tablet (6.2 mg total) by mouth every 12 (twelve) hours. Marnee Guarneri T, NP Taking  Active   gabapentin (NEURONTIN) 600 MG tablet [id number] Yes Take 600 mg by mouth 2 (two) times daily as needed. [provider] Taking Active   metFORMIN (GLUCOPHAGE) 500 MG tablet [id number]  Take 1 tablet (500 MG) once a day for one week, then take 1 tablet twice a day, continue to increase slowly to taking 2 tablets twice a day. Marnee Guarneri T, NP  Active   Potassium Chloride ER 20 MEQ TBCR [id number] Yes Take 20 mEq by mouth daily. [provider] Taking Active Self           Assessment:   Goals Addressed            This Visit's Progress     Patient Stated   . "I want to work on my diabetes" (pt-stated)       Current Barriers:  . Diabetes: uncontrolled, most recent A1c 12%  . Current antihyperglycemic regimen: stopped taking insulin 70/30 due to s/sx hypoglycemia . Current exercise: Notes that she drives to [place], [place] to the [employer] 2 times per week, plans to increase to 3  . Current diet: received extensive diabetic education at a one on one class yesterday; plans to focus on a plant-based diet and is purchasing herbal supplements for her chronic conditions, but is unsure of the names . Current blood glucose readings: 227-400s . Cardiovascular risk reduction: o Current hypertensive regimen: amlodipine 5 mg daily, carvedilol 6.25 mg BID (aortic aneurysm, followed by [place], [place] follow up in  the next few weeks) o Current hyperlipidemia regimen: nothing at this time, plans to discuss if she needs daily ASA therapy with cardiology at upcoming appointment;   Pharmacist Clinical Goal(s):  Marland Kitchen Over the next 90 days, patient with work with PharmD and primary care provider to address optimized management of diabetes  Interventions: . Comprehensive medication review performed . Intensive education regarding mechanism of action of metformin vs insulin, hypoglycemic and weight gain potential of insulin. Collaboratively decided to reinitiate metformin  with titration to minimize GI upset. Patient agreeable.  . Intensive discussion of goal A1c, fasting, and post prandial blood sugars. Patient verbalizes understanding of glucometer use . Provided dietary education to support recommendations made by CDE yesterday. Praised patient on her exercise goals and intent to increase to TIW exercise.  . Additionally educated on role of potassium & magnesium in the body  . Encouraged patient to bring all "natural supplements" to next face to face appointment for review for potential drug/disease interactions  Patient Self Care Activities:  . Patient will check blood glucose BID, document, and provide at future appointments . Patient will take medications as prescribed . Patient will report any questions or concerns to provider   Initial goal documentation        Plan: - CCM team will outreach patient in 2-4 weeks for continued medication management support.   Catie Darnelle Maffucci, PharmD Clinical Pharmacist [place] [phone]

## 2018-11-24 NOTE — Assessment & Plan Note (Signed)
Chronic, ongoing.  Will discontinue insulin and switch back to Metformin.  Script sent.  CCM team involved and will work along with them.  Continue to attend diabetic education.  Consider addition of GLP or Jardiance in future if A1C continues to be elevated on recheck in September.  Return in 4 weeks.

## 2018-11-24 NOTE — Patient Instructions (Signed)
Carbohydrate Counting for Diabetes Mellitus, Adult  Carbohydrate counting is a method of keeping track of how many carbohydrates you eat. Eating carbohydrates naturally increases the amount of sugar (glucose) in the blood. Counting how many carbohydrates you eat helps keep your blood glucose within normal limits, which helps you manage your diabetes (diabetes mellitus). It is important to know how many carbohydrates you can safely have in each meal. This is different for every person. A diet and nutrition specialist (registered dietitian) can help you make a meal plan and calculate how many carbohydrates you should have at each meal and snack. Carbohydrates are found in the following foods:  Grains, such as breads and cereals.  Dried beans and soy products.  Starchy vegetables, such as potatoes, peas, and corn.  Fruit and fruit juices.  Milk and yogurt.  Sweets and snack foods, such as cake, cookies, candy, chips, and soft drinks. How do I count carbohydrates? There are two ways to count carbohydrates in food. You can use either of the methods or a combination of both. Reading "Nutrition Facts" on packaged food The "Nutrition Facts" list is included on the labels of almost all packaged foods and beverages in the [place] It includes:  The serving size.  Information about nutrients in each serving, including the grams (g) of carbohydrate per serving. To use the "Nutrition Facts":  Decide how many servings you will have.  Multiply the number of servings by the number of carbohydrates per serving.  The resulting number is the total amount of carbohydrates that you will be having. Learning standard serving sizes of other foods When you eat carbohydrate foods that are not packaged or do not include "Nutrition Facts" on the label, you need to measure the servings in order to count the amount of carbohydrates:  Measure the foods that you will eat with a food scale or measuring cup, if needed.   Decide how many standard-size servings you will eat.  Multiply the number of servings by 15. Most carbohydrate-rich foods have about 15 g of carbohydrates per serving. ? For example, if you eat 8 oz (170 g) of strawberries, you will have eaten 2 servings and 30 g of carbohydrates (2 servings x 15 g = 30 g).  For foods that have more than one food mixed, such as soups and casseroles, you must count the carbohydrates in each food that is included. The following list contains standard serving sizes of common carbohydrate-rich foods. Each of these servings has about 15 g of carbohydrates:   hamburger bun or  English muffin.   oz (15 mL) syrup.   oz (14 g) jelly.  1 slice of bread.  1 six-inch tortilla.  3 oz (85 g) cooked rice or pasta.  4 oz (113 g) cooked dried beans.  4 oz (113 g) starchy vegetable, such as peas, corn, or potatoes.  4 oz (113 g) hot cereal.  4 oz (113 g) mashed potatoes or  of a large baked potato.  4 oz (113 g) canned or frozen fruit.  4 oz (120 mL) fruit juice.  4-6 crackers.  6 chicken nuggets.  6 oz (170 g) unsweetened dry cereal.  6 oz (170 g) plain fat-free yogurt or yogurt sweetened with artificial sweeteners.  8 oz (240 mL) milk.  8 oz (170 g) fresh fruit or one small piece of fruit.  24 oz (680 g) popped popcorn. Example of carbohydrate counting Sample meal  3 oz (85 g) chicken breast.  6 oz (170 g)   brown rice.  4 oz (113 g) corn.  8 oz (240 mL) milk.  8 oz (170 g) strawberries with sugar-free whipped topping. Carbohydrate calculation 1. Identify the foods that contain carbohydrates: ? Rice. ? Corn. ? Milk. ? Strawberries. 2. Calculate how many servings you have of each food: ? 2 servings rice. ? 1 serving corn. ? 1 serving milk. ? 1 serving strawberries. 3. Multiply each number of servings by 15 g: ? 2 servings rice x 15 g = 30 g. ? 1 serving corn x 15 g = 15 g. ? 1 serving milk x 15 g = 15 g. ? 1 serving  strawberries x 15 g = 15 g. 4. Add together all of the amounts to find the total grams of carbohydrates eaten: ? 30 g + 15 g + 15 g + 15 g = 75 g of carbohydrates total. Summary  Carbohydrate counting is a method of keeping track of how many carbohydrates you eat.  Eating carbohydrates naturally increases the amount of sugar (glucose) in the blood.  Counting how many carbohydrates you eat helps keep your blood glucose within normal limits, which helps you manage your diabetes.  A diet and nutrition specialist (registered dietitian) can help you make a meal plan and calculate how many carbohydrates you should have at each meal and snack. This information is not intended to replace advice given to you by your health care provider. Make sure you discuss any questions you have with your health care provider. Document Released: 04/22/2005 Document Revised: 11/14/2016 Document Reviewed: 10/04/2015 Elsevier Patient Education  2020 Elsevier Inc.  

## 2018-11-24 NOTE — Patient Instructions (Signed)
Visit Information  Goals Addressed            This Visit's Progress     Patient Stated   . "I want to work on my diabetes" (pt-stated)       Current Barriers:  . Diabetes: uncontrolled, most recent A1c 12%  . Current antihyperglycemic regimen: stopped taking insulin 70/30 due to s/sx hypoglycemia . Current exercise: Notes that she drives to [place], [place] to the [employer] 2 times per week, plans to increase to 3  . Current diet: received extensive diabetic education at a one on one class yesterday; plans to focus on a plant-based diet and is purchasing herbal supplements for her chronic conditions, but is unsure of the names . Current blood glucose readings: 227-400s . Cardiovascular risk reduction: o Current hypertensive regimen: amlodipine 5 mg daily, carvedilol 6.25 mg BID (aortic aneurysm, followed by [place], [place] follow up in the next few weeks) o Current hyperlipidemia regimen: nothing at this time, plans to discuss if she needs daily ASA therapy with cardiology at upcoming appointment;   Pharmacist Clinical Goal(s):  Marland Kitchen Over the next 90 days, patient with work with PharmD and primary care provider to address optimized management of diabetes  Interventions: . Comprehensive medication review performed . Intensive education regarding mechanism of action of metformin vs insulin, hypoglycemic and weight gain potential of insulin. Collaboratively decided to reinitiate metformin with titration to minimize GI upset. Patient agreeable.  . Intensive discussion of goal A1c, fasting, and post prandial blood sugars. Patient verbalizes understanding of glucometer use . Provided dietary education to support recommendations made by CDE yesterday. Praised patient on her exercise goals and intent to increase to TIW exercise.  . Additionally educated on role of potassium & magnesium in the body  . Encouraged patient to bring all "natural supplements" to next face to face appointment  for review for potential drug/disease interactions  Patient Self Care Activities:  . Patient will check blood glucose BID, document, and provide at future appointments . Patient will take medications as prescribed . Patient will report any questions or concerns to provider   Initial goal documentation        The patient verbalized understanding of instructions provided today and declined a print copy of patient instruction materials.  Plan: - CCM team will outreach patient in 2-4 weeks for continued medication management support.   Catie Darnelle Maffucci, PharmD Clinical Pharmacist [place] [phone]
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
## 2018-11-26 ENCOUNTER — Telehealth: Payer: Self-pay | Admitting: Pharmacy Technician

## 2018-11-26 NOTE — Telephone Encounter (Signed)
Patient has Medicare. We are unable to assist with medications. A Denial letter was mailed alerting the patient she has medicare. If she loses benefits, she could potentially become a patient again after going through the eligibility process.   [name], CPhT [facility]

## 2018-11-27 ENCOUNTER — Telehealth: Payer: Self-pay

## 2018-11-30 ENCOUNTER — Ambulatory Visit
Admission: RE | Admit: 2018-11-30 | Discharge: 2018-11-30 | Disposition: A | Discharge status: Home / Self Care | Payer: Medicare Other | Source: Ambulatory Visit | Attending: Obstetrics and Gynecology | Admitting: Obstetrics and Gynecology

## 2018-11-30 ENCOUNTER — Ambulatory Visit: Payer: Medicare Other

## 2018-11-30 ENCOUNTER — Other Ambulatory Visit: Payer: Self-pay

## 2018-11-30 DIAGNOSIS — N739 Female pelvic inflammatory disease, unspecified: Secondary | ICD-10-CM | POA: Diagnosis present

## 2018-12-07 ENCOUNTER — Encounter: Payer: Medicare HMO | Attending: Nurse Practitioner | Admitting: Dietician

## 2018-12-07 ENCOUNTER — Other Ambulatory Visit: Payer: Self-pay

## 2018-12-07 ENCOUNTER — Encounter: Payer: Self-pay | Admitting: Dietician

## 2018-12-07 VITALS — Ht 63.0 in | Wt 171.9 lb

## 2018-12-07 DIAGNOSIS — E1140 Type 2 diabetes mellitus with diabetic neuropathy, unspecified: Secondary | ICD-10-CM | POA: Insufficient documentation

## 2018-12-07 DIAGNOSIS — E1165 Type 2 diabetes mellitus with hyperglycemia: Secondary | ICD-10-CM

## 2018-12-07 NOTE — Progress Notes (Signed)

## 2018-12-14 ENCOUNTER — Other Ambulatory Visit: Payer: Self-pay

## 2018-12-14 ENCOUNTER — Encounter: Payer: Self-pay | Admitting: Dietician

## 2018-12-14 NOTE — Progress Notes (Signed)
Pt did not come to class 2 tonight.  Rescheduled class 2 on 01-04-19

## 2018-12-15 ENCOUNTER — Ambulatory Visit (INDEPENDENT_AMBULATORY_CARE_PROVIDER_SITE_OTHER): Payer: Medicare HMO | Admitting: *Deleted

## 2018-12-15 ENCOUNTER — Telehealth: Payer: Self-pay | Admitting: Obstetrics and Gynecology

## 2018-12-15 DIAGNOSIS — I152 Hypertension secondary to endocrine disorders: Secondary | ICD-10-CM

## 2018-12-15 DIAGNOSIS — E1140 Type 2 diabetes mellitus with diabetic neuropathy, unspecified: Secondary | ICD-10-CM

## 2018-12-15 DIAGNOSIS — I10 Essential (primary) hypertension: Secondary | ICD-10-CM | POA: Diagnosis not present

## 2018-12-15 DIAGNOSIS — E1159 Type 2 diabetes mellitus with other circulatory complications: Secondary | ICD-10-CM

## 2018-12-15 NOTE — Chronic Care Management (AMB) (Signed)
Chronic Care Management   Follow Up Note   12/15/2018 Name: Julie Tran MRN: [id number] DOB: [birth year]  Referred by: Venita Lick, NP Reason for referral : Chronic Care Management (DM2)   Julie Tran is a 68 y.o. year old female who is a primary care patient of Cannady, Barbaraann Faster, NP. The CCM team was consulted for assistance with chronic disease management and care coordination needs.    Review of patient status, including review of consultants reports, relevant laboratory and other test results, and collaboration with appropriate care team members and the patient's provider was performed as part of comprehensive patient evaluation and provision of chronic care management services.    Goals Addressed            This Visit's Progress   . I need to get some education about my diabetes (pt-stated)       Current Barriers:  Marland Kitchen Knowledge Deficits related to basic Diabetes pathophysiology and self care/management . Knowledge Deficits related to medications used for management of diabetes . Does not use cbg meter -Patient has meter unsure how to use per patient  Case Manager Clinical Goal(s):  Over the next 120 days, patient will demonstrate improved adherence to prescribed treatment plan for diabetes self care/management as evidenced by:  . daily monitoring and recording of CBG  . adherence to ADA/ carb modified diet . adherence to prescribed medication regimen  Interventions:  . Provided education to patient about basic DM disease process . Reviewed medications with patient and discussed importance of medication adherence . Discussed plans with patient for ongoing care management follow up and provided patient with direct contact information for care management team . Provided patient with written educational materials related to hypo and hyperglycemia and importance of correct treatment . Reviewed scheduled/upcoming provider appointments including: upcoming [place] 8/17 and 8/31and PCP 8/25 . Discussed education received from the [facility], patient expressed it was very helpful . Patient plans to start her increased dose of metformin, this week she has been busy and did not increase it as planned.  . Reports using glucometer as directed, with sugars in the 140s . Plan to meet with patient at provider office after next PCP appt 8/25. Marland Kitchen Listened to patient's concerns regarding recent move and past strife with her family. Discussed the ways stress affects her body and sugar. Patient states she relies heavily on her faith to help her through hard times.  . Patient discussed new residence has many stairs and it is hard for her to get her groceries up related to pain in her legs, discussed options for grocery delivery(instacart) Patient plans to consider.  . Empathetic listening related to past and present life struggles, patient reports having someone to talk to is helpful. Offered LCSW but patient stating she has had a bad experience with SW in [place] so she declined.  Patient Self Care Activities:  . UNABLE to independently to manage diabetes as evidenced by recent A1C of 12.9%  Please see past updates related to this goal by clicking on the "Past Updates" button in the selected goal          The care management team will reach out to the patient again over the next 30 days.  The patient has been provided with contact information for the care management team and has been advised to call with any health related questions or concerns.    Julie Tibbett RN, BSN Nurse Case Editor, [facility]  (  [phone]) Business Mobile

## 2018-12-15 NOTE — Telephone Encounter (Signed)
Discussed resolution of prior findings of fluid collection in the pelvis on CT scan in early June.  There is no recommended follow-up at this point.  We discussed my concern for malignancy in the setting of an unexplained pelvic abscess.  If new symptoms develop in the future, she will let me know.  Most likely I would recommend her seeing GYN oncology, if this were to occur.  She voiced understanding and agreement with my recommendations.

## 2018-12-19 NOTE — Patient Instructions (Signed)
Thank you allowing the Chronic Care Management Team to be a part of your care! It was a pleasure speaking with you today!   CCM (Chronic Care Management) Team   Randall Rampersad RN, BSN Nurse Care Coordinator  [phone]  [facility] PharmD  Clinical Pharmacist  [phone]  Eula Fried LCSW Clinical Social Worker [phone]  Goals Addressed            This Visit's Progress   . I need to get some education about my diabetes (pt-stated)       Current Barriers:  Marland Kitchen Knowledge Deficits related to basic Diabetes pathophysiology and self care/management . Knowledge Deficits related to medications used for management of diabetes . Does not use cbg meter -Patient has meter unsure how to use per patient  Case Manager Clinical Goal(s):  Over the next 120 days, patient will demonstrate improved adherence to prescribed treatment plan for diabetes self care/management as evidenced by:  . daily monitoring and recording of CBG  . adherence to ADA/ carb modified diet . adherence to prescribed medication regimen  Interventions:  . Provided education to patient about basic DM disease process . Reviewed medications with patient and discussed importance of medication adherence . Discussed plans with patient for ongoing care management follow up and provided patient with direct contact information for care management team . Provided patient with written educational materials related to hypo and hyperglycemia and importance of correct treatment . Reviewed scheduled/upcoming provider appointments including: upcoming [place] 8/17 and 8/31and PCP 8/25 . Discussed education received from the [facility], patient expressed it was very helpful . Patient plans to start her increased dose of metformin, this week she has been busy and did not increase it as planned.  . Reports using glucometer as directed, with sugars in the 140s . Plan to meet with patient at provider office  after next PCP appt 8/25. Marland Kitchen Listened to patient's concerns regarding recent move and past strife with her family. Discussed the ways stress affects her body and sugar. Patient states she relies heavily on her faith to help her through hard times.  . Patient discussed new residence has many stairs and it is hard for her to get her groceries up related to pain in her legs, discussed options for grocery delivery(instacart) Patient plans to consider.  . Empathetic listening related to past and present life struggles, patient reports having someone to talk to is helpful. Offered LCSW but patient stating she has had a bad experience with SW in [place] so she declined.  Patient Self Care Activities:  . UNABLE to independently to manage diabetes as evidenced by recent A1C of 12.9%  Please see past updates related to this goal by clicking on the "Past Updates" button in the selected goal         The patient verbalized understanding of instructions provided today and declined a print copy of patient instruction materials.   The patient has been provided with contact information for the care management team and has been advised to call with any health related questions or concerns.

## 2018-12-21 ENCOUNTER — Encounter: Payer: Self-pay | Admitting: Dietician

## 2018-12-21 ENCOUNTER — Ambulatory Visit: Payer: Medicare Other

## 2018-12-21 ENCOUNTER — Ambulatory Visit: Payer: Self-pay | Admitting: *Deleted

## 2018-12-21 DIAGNOSIS — I7777 Dissection of artery of lower extremity: Secondary | ICD-10-CM | POA: Diagnosis not present

## 2018-12-21 DIAGNOSIS — Z95828 Presence of other vascular implants and grafts: Secondary | ICD-10-CM | POA: Diagnosis not present

## 2018-12-21 DIAGNOSIS — I712 Thoracic aortic aneurysm, without rupture: Secondary | ICD-10-CM | POA: Diagnosis not present

## 2018-12-21 DIAGNOSIS — I7101 Dissection of thoracic aorta: Secondary | ICD-10-CM | POA: Diagnosis not present

## 2018-12-21 DIAGNOSIS — E1140 Type 2 diabetes mellitus with diabetic neuropathy, unspecified: Secondary | ICD-10-CM

## 2018-12-21 NOTE — Chronic Care Management (AMB) (Signed)
Chronic Care Management   Follow Up Note   12/21/2018 Name: Julie Tran MRN: [id number] DOB: [birth year]  Referred by: Venita Lick, NP Reason for referral : No chief complaint on file.   Julie Tran is a 68 y.o. year old female who is a primary care patient of Cannady, Henrine Screws T, NP. The CCM team was consulted for assistance with chronic disease management and care coordination needs.    Review of patient status, including review of consultants reports, relevant laboratory and other test results, and collaboration with appropriate care team members and the patient's provider was performed as part of comprehensive patient evaluation and provision of chronic care management services.    Outpatient Encounter Medications as of 12/21/2018  Medication Sig  . amLODipine (NORVASC) 5 MG tablet Take 1 tablet (5 mg total) by mouth daily.  . Calcium-Magnesium-Vitamin D (CALCIUM MAGNESIUM PO) Take by mouth. 2 tablets in morning and 1 tablet in evening  . carvedilol (COREG) 6.25 MG tablet Take 1 tablet (6.2 mg total) by mouth every 12 (twelve) hours.  . gabapentin (NEURONTIN) 600 MG tablet Take 600 mg by mouth 2 (two) times daily as needed.  . hydrochlorothiazide (HYDRODIURIL) 25 MG tablet   . magnesium oxide (MAG-OX) 400 MG tablet Take 400 mg by mouth daily.  . metFORMIN (GLUCOPHAGE) 500 MG tablet Take 1 tablet (500 MG) once a day for one week, then take 1 tablet twice a day, continue to increase slowly to taking 2 tablets twice a day.  . Potassium Chloride ER 20 MEQ TBCR Take 20 mEq by mouth daily.  . TURMERIC PO Take 1 tablet by mouth daily.   No facility-administered encounter medications on file as of 12/21/2018.      Goals Addressed            This Visit's Progress   . I need to get some education about my diabetes (pt-stated)       Current Barriers:  Marland Kitchen Knowledge Deficits related to basic Diabetes pathophysiology and self care/management . Knowledge Deficits related to  medications used for management of diabetes . Does not use cbg meter -Patient has meter unsure how to use per patient  Case Manager Clinical Goal(s):  Over the next 120 days, patient will demonstrate improved adherence to prescribed treatment plan for diabetes self care/management as evidenced by:  . daily monitoring and recording of CBG  . adherence to ADA/ carb modified diet . adherence to prescribed medication regimen  Interventions:  . Provided education to patient about basic DM disease process . Reviewed medications with patient and discussed importance of medication adherence . Discussed plans with patient for ongoing care management follow up and provided patient with direct contact information for care management team . Provided patient with written educational materials related to hypo and hyperglycemia and importance of correct treatment . Reviewed scheduled/upcoming provider appointments including: upcoming [facility] 8/17 and 8/31and PCP 8/25 . Discussed education received from the [facility], patient expressed it was very helpful . Patient plans to start her increased dose of metformin, this week she has been busy and did not increase it as planned.  . Reports using glucometer as directed, with sugars in the 140s . Plan to meet with patient at provider office after next PCP appt 8/25. Marland Kitchen Listened to patient's concerns regarding recent move and past strife with her family. Discussed the ways stress affects her body and sugar. Patient states she relies heavily on her faith to help her through hard times.  Marland Kitchen  Patient discussed new residence has many stairs and it is hard for her to get her groceries up related to pain in her legs, discussed options for grocery delivery(instacart) Patient plans to consider.  . Empathetic listening related to past and present life struggles, patient reports having someone to talk to is helpful. Offered LCSW but patient stating she has  had a bad experience with SW in [place] so she declined. . Patient called and stated" I am at an appointment at [facility] and will not be able to make it to my Diabetes class, could you please call them and let them know" . [facility] called and made aware patient unable to make the scheduled class and would like for them to reach out to reschedule tomorrow.   Patient Self Care Activities:  . UNABLE to independently to manage diabetes as evidenced by recent A1C of 12.9%  Please see past updates related to this goal by clicking on the "Past Updates" button in the selected goal          The patient has been provided with contact information for the care management team and has been advised to call with any health related questions or concerns.   Ma RingsJanci Nga Rabon RN, BSN Nurse Case Education officer, communityManager [facility]  [phone]) Business Mobile

## 2018-12-21 NOTE — Progress Notes (Signed)
Patient unable to attend diabetes class 3 tonight due to another appointment running late. She is scheduled to return for class 2 on 01/04/19; will reschedule class 3 at that time.

## 2018-12-24 DIAGNOSIS — Z95828 Presence of other vascular implants and grafts: Secondary | ICD-10-CM | POA: Diagnosis not present

## 2018-12-24 DIAGNOSIS — I712 Thoracic aortic aneurysm, without rupture: Secondary | ICD-10-CM | POA: Diagnosis not present

## 2018-12-24 DIAGNOSIS — I7101 Dissection of thoracic aorta: Secondary | ICD-10-CM | POA: Diagnosis not present

## 2018-12-28 DIAGNOSIS — R262 Difficulty in walking, not elsewhere classified: Secondary | ICD-10-CM | POA: Diagnosis not present

## 2018-12-28 DIAGNOSIS — R2689 Other abnormalities of gait and mobility: Secondary | ICD-10-CM | POA: Diagnosis not present

## 2018-12-28 DIAGNOSIS — M6281 Muscle weakness (generalized): Secondary | ICD-10-CM | POA: Diagnosis not present

## 2018-12-29 ENCOUNTER — Other Ambulatory Visit: Payer: Self-pay

## 2018-12-29 ENCOUNTER — Encounter: Payer: Self-pay | Admitting: Nurse Practitioner

## 2018-12-29 ENCOUNTER — Ambulatory Visit (INDEPENDENT_AMBULATORY_CARE_PROVIDER_SITE_OTHER): Payer: Medicare HMO | Admitting: Nurse Practitioner

## 2018-12-29 ENCOUNTER — Ambulatory Visit: Payer: Self-pay | Admitting: Pharmacist

## 2018-12-29 VITALS — BP 132/80 | HR 88 | Temp 98.4°F

## 2018-12-29 DIAGNOSIS — E785 Hyperlipidemia, unspecified: Secondary | ICD-10-CM

## 2018-12-29 DIAGNOSIS — E1140 Type 2 diabetes mellitus with diabetic neuropathy, unspecified: Secondary | ICD-10-CM

## 2018-12-29 DIAGNOSIS — E1169 Type 2 diabetes mellitus with other specified complication: Secondary | ICD-10-CM | POA: Diagnosis not present

## 2018-12-29 DIAGNOSIS — E1159 Type 2 diabetes mellitus with other circulatory complications: Secondary | ICD-10-CM

## 2018-12-29 DIAGNOSIS — I152 Hypertension secondary to endocrine disorders: Secondary | ICD-10-CM

## 2018-12-29 DIAGNOSIS — E049 Nontoxic goiter, unspecified: Secondary | ICD-10-CM | POA: Diagnosis not present

## 2018-12-29 DIAGNOSIS — I10 Essential (primary) hypertension: Secondary | ICD-10-CM | POA: Diagnosis not present

## 2018-12-29 LAB — MICROALBUMIN, URINE WAIVED
Creatinine, Urine Waived: 50 mg/dL (ref 10–300)
Microalb, Ur Waived: 30 mg/L — ABNORMAL HIGH (ref 0–19)

## 2018-12-29 LAB — BAYER DCA HB A1C WAIVED: HB A1C (BAYER DCA - WAIVED): 8.8 % — ABNORMAL HIGH (ref <7.0)

## 2018-12-29 NOTE — Assessment & Plan Note (Signed)
Check TSH today, consider referral for continued monitoring of cysts if patient agrees to ongoing follow-up.

## 2018-12-29 NOTE — Assessment & Plan Note (Signed)
Chronic, ongoing with initial elevation in BP but repeat improved at goal.  Continue Amlodipine and Carvedilol.  Recommend she check BP at least 3 times a week and document for provider.  Obtain CMP today.  Return in 3 months.

## 2018-12-29 NOTE — Assessment & Plan Note (Signed)
Chronic, ongoing.  A1C improved today at 8.8%.  Recently increased to maintenance 1000 MG BID dose of Metformin, will continue this.  Recommend continued focus on diet at home.  Will consider Jardiance or GLP addition if continued elevation above goal of <7 at next visit.  Return in 3 months.

## 2018-12-29 NOTE — Patient Instructions (Signed)
Visit Information  Goals Addressed            This Visit's Progress     Patient Stated   . "I want to work on my diabetes" (pt-stated)       Current Barriers:  . Diabetes: uncontrolled, most recent A1c today improved to 8% o Has been on max dose metformin for ~2 weeks . Current antihyperglycemic regimen: metformin 1000 mg BID o Reports some stomach upset, but improved with daily probiotic  . Current exercise: Notes that she drives to [place], [place] to the [employer] 2 times per week, plans to increase to 3  . Current diet: attended nutrition class . Current blood glucose readings: reports fasting this morning 135  . Cardiovascular risk reduction: o Current hypertensive regimen: amlodipine 5 mg daily, carvedilol 6.25 mg BID,  o Current hyperlipidemia regimen: nothing at this time, checking lipids today  Pharmacist Clinical Goal(s):  Marland Kitchen Over the next 90 days, patient with work with PharmD and primary care provider to address optimized management of diabetes  Interventions: . Comprehensive medication review performed . Congratulated patient on improvement in A1c with medication, diet, and lifestyle changes. Anticipate continued improvement over time . Extensive discussion regarding diabetic neuropathy. She requests refill on gabapentin. Will collaborate with Marnee Guarneri on this . 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 May consider SGLT2 in the future, given renoprotective benefits . Discussed checking BP at home. Encouraged patient to contact [employer] to determine if she has OTC benefits, and to purchase BP cuff through those. Patient verbalized understanding.   Patient Self Care Activities:  . Patient will check blood glucose BID, document, and provide at future appointments . Patient will take medications as prescribed . Patient will report any questions or concerns to provider   Please see past updates related to this goal by clicking on the "Past Updates" button  in the selected goal         The patient verbalized understanding of instructions provided today and declined a print copy of patient instruction materials.   Plan:  - Will outreach patient in ~4 weeks for continued medication management  Catie Darnelle Maffucci, PharmD Clinical Pharmacist [place] [phone]

## 2018-12-29 NOTE — Assessment & Plan Note (Signed)
Chronic, ongoing.  Refuses statin at this time.  Lipid panel today.

## 2018-12-29 NOTE — Patient Instructions (Signed)
Carbohydrate Counting for Diabetes Mellitus, Adult  Carbohydrate counting is a method of keeping track of how many carbohydrates you eat. Eating carbohydrates naturally increases the amount of sugar (glucose) in the blood. Counting how many carbohydrates you eat helps keep your blood glucose within normal limits, which helps you manage your diabetes (diabetes mellitus). It is important to know how many carbohydrates you can safely have in each meal. This is different for every person. A diet and nutrition specialist (registered dietitian) can help you make a meal plan and calculate how many carbohydrates you should have at each meal and snack. Carbohydrates are found in the following foods:  Grains, such as breads and cereals.  Dried beans and soy products.  Starchy vegetables, such as potatoes, peas, and corn.  Fruit and fruit juices.  Milk and yogurt.  Sweets and snack foods, such as cake, cookies, candy, chips, and soft drinks. How do I count carbohydrates? There are two ways to count carbohydrates in food. You can use either of the methods or a combination of both. Reading "Nutrition Facts" on packaged food The "Nutrition Facts" list is included on the labels of almost all packaged foods and beverages in the [place] It includes:  The serving size.  Information about nutrients in each serving, including the grams (g) of carbohydrate per serving. To use the "Nutrition Facts":  Decide how many servings you will have.  Multiply the number of servings by the number of carbohydrates per serving.  The resulting number is the total amount of carbohydrates that you will be having. Learning standard serving sizes of other foods When you eat carbohydrate foods that are not packaged or do not include "Nutrition Facts" on the label, you need to measure the servings in order to count the amount of carbohydrates:  Measure the foods that you will eat with a food scale or measuring cup, if needed.   Decide how many standard-size servings you will eat.  Multiply the number of servings by 15. Most carbohydrate-rich foods have about 15 g of carbohydrates per serving. ? For example, if you eat 8 oz (170 g) of strawberries, you will have eaten 2 servings and 30 g of carbohydrates (2 servings x 15 g = 30 g).  For foods that have more than one food mixed, such as soups and casseroles, you must count the carbohydrates in each food that is included. The following list contains standard serving sizes of common carbohydrate-rich foods. Each of these servings has about 15 g of carbohydrates:   hamburger bun or  English muffin.   oz (15 mL) syrup.   oz (14 g) jelly.  1 slice of bread.  1 six-inch tortilla.  3 oz (85 g) cooked rice or pasta.  4 oz (113 g) cooked dried beans.  4 oz (113 g) starchy vegetable, such as peas, corn, or potatoes.  4 oz (113 g) hot cereal.  4 oz (113 g) mashed potatoes or  of a large baked potato.  4 oz (113 g) canned or frozen fruit.  4 oz (120 mL) fruit juice.  4-6 crackers.  6 chicken nuggets.  6 oz (170 g) unsweetened dry cereal.  6 oz (170 g) plain fat-free yogurt or yogurt sweetened with artificial sweeteners.  8 oz (240 mL) milk.  8 oz (170 g) fresh fruit or one small piece of fruit.  24 oz (680 g) popped popcorn. Example of carbohydrate counting Sample meal  3 oz (85 g) chicken breast.  6 oz (170 g)   brown rice.  4 oz (113 g) corn.  8 oz (240 mL) milk.  8 oz (170 g) strawberries with sugar-free whipped topping. Carbohydrate calculation 1. Identify the foods that contain carbohydrates: ? Rice. ? Corn. ? Milk. ? Strawberries. 2. Calculate how many servings you have of each food: ? 2 servings rice. ? 1 serving corn. ? 1 serving milk. ? 1 serving strawberries. 3. Multiply each number of servings by 15 g: ? 2 servings rice x 15 g = 30 g. ? 1 serving corn x 15 g = 15 g. ? 1 serving milk x 15 g = 15 g. ? 1 serving  strawberries x 15 g = 15 g. 4. Add together all of the amounts to find the total grams of carbohydrates eaten: ? 30 g + 15 g + 15 g + 15 g = 75 g of carbohydrates total. Summary  Carbohydrate counting is a method of keeping track of how many carbohydrates you eat.  Eating carbohydrates naturally increases the amount of sugar (glucose) in the blood.  Counting how many carbohydrates you eat helps keep your blood glucose within normal limits, which helps you manage your diabetes.  A diet and nutrition specialist (registered dietitian) can help you make a meal plan and calculate how many carbohydrates you should have at each meal and snack. This information is not intended to replace advice given to you by your health care provider. Make sure you discuss any questions you have with your health care provider. Document Released: 04/22/2005 Document Revised: 11/14/2016 Document Reviewed: 10/04/2015 Elsevier Patient Education  2020 Elsevier Inc.  

## 2018-12-29 NOTE — Chronic Care Management (AMB) (Signed)
Chronic Care Management   Follow Up Note   12/29/2018 Name: Julie Tran MRN: [id number] DOB: Sep 01, 1950  Referred by: Venita Lick, NP Reason for referral : Chronic Care Management (Medication Management)   Julie Tran is a 68 y.o. year old female who is a primary care patient of Cannady, Barbaraann Faster, NP. The CCM team was consulted for assistance with chronic disease management and care coordination needs.    Met with patient face to face during provider appointment today.   Review of patient status, including review of consultants reports, relevant laboratory and other test results, and collaboration with appropriate care team members and the patient's provider was performed as part of comprehensive patient evaluation and provision of chronic care management services.    SDOH (Social Determinants of Health) screening performed today: Housing  Stress. See Care Plan for related entries.   Outpatient Encounter Medications as of 12/29/2018  Medication Sig Note  . amLODipine (NORVASC) 5 MG tablet Take 1 tablet (5 mg total) by mouth daily. 12/29/2018: QAM  . carvedilol (COREG) 6.25 MG tablet Take 1 tablet (6.2 mg total) by mouth every 12 (twelve) hours.   . gabapentin (NEURONTIN) 600 MG tablet Take 600 mg by mouth 2 (two) times daily as needed.   . metFORMIN (GLUCOPHAGE) 500 MG tablet Take 1 tablet (500 MG) once a day for one week, then take 1 tablet twice a day, continue to increase slowly to taking 2 tablets twice a day.   . Probiotic Product (PROBIOTIC DAILY PO) Take 1 tablet by mouth daily.   . Calcium-Magnesium-Vitamin D (CALCIUM MAGNESIUM PO) Take by mouth. 2 tablets in morning and 1 tablet in evening   . hydrochlorothiazide (HYDRODIURIL) 25 MG tablet    . magnesium oxide (MAG-OX) 400 MG tablet Take 400 mg by mouth daily.   . Potassium Chloride ER 20 MEQ TBCR Take 20 mEq by mouth daily.   . TURMERIC PO Take 1 tablet by mouth daily.    No facility-administered encounter  medications on file as of 12/29/2018.      Goals Addressed            This Visit's Progress     Patient Stated   . "I want to work on my diabetes" (pt-stated)       Current Barriers:  . Diabetes: uncontrolled, most recent A1c today improved to 8% o Has been on max dose metformin for ~2 weeks . Current antihyperglycemic regimen: metformin 1000 mg BID o Reports some stomach upset, but improved with daily probiotic  . Current exercise: Notes that she drives to [place], [place] to the [employer] 2 times per week, plans to increase to 3  . Current diet: attended nutrition class . Current blood glucose readings: reports fasting this morning 135  . Cardiovascular risk reduction: o Current hypertensive regimen: amlodipine 5 mg daily, carvedilol 6.25 mg BID,  o Current hyperlipidemia regimen: nothing at this time, checking lipids today  Pharmacist Clinical Goal(s):  Marland Kitchen Over the next 90 days, patient with work with PharmD and primary care provider to address optimized management of diabetes  Interventions: . Comprehensive medication review performed . Congratulated patient on improvement in A1c with medication, diet, and lifestyle changes. Anticipate continued improvement over time . Extensive discussion regarding diabetic neuropathy. She requests refill on gabapentin. Will collaborate with Marnee Guarneri on this . 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 May consider SGLT2 in the future, given renoprotective benefits . Discussed checking BP at home. Encouraged patient to contact  [employer] to determine if she has OTC benefits, and to purchase BP cuff through those. Patient verbalized understanding.   Patient Self Care Activities:  . Patient will check blood glucose BID, document, and provide at future appointments . Patient will take medications as prescribed . Patient will report any questions or concerns to provider   Please see past updates related to this goal by clicking on  the "Past Updates" button in the selected goal         Plan:  - Will outreach patient in ~4 weeks for continued medication management  Catie Darnelle Maffucci, PharmD Clinical Pharmacist [place] [phone]
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
## 2018-12-30 LAB — COMPREHENSIVE METABOLIC PANEL
ALT: 12 IU/L (ref 0–32)
AST: 16 IU/L (ref 0–40)
Albumin/Globulin Ratio: 2 (ref 1.2–2.2)
Albumin: 4.7 g/dL (ref 3.8–4.8)
Alkaline Phosphatase: 88 IU/L (ref 39–117)
BUN/Creatinine Ratio: 28 (ref 12–28)
BUN: 18 mg/dL (ref 8–27)
Bilirubin Total: 0.2 mg/dL (ref 0.0–1.2)
CO2: 28 mmol/L (ref 20–29)
Calcium: 9.7 mg/dL (ref 8.7–10.3)
Chloride: 100 mmol/L (ref 96–106)
Creatinine, Ser: 0.65 mg/dL (ref 0.57–1.00)
GFR calc Af Amer: 105 mL/min/{1.73_m2} (ref >59)
GFR calc non Af Amer: 91 mL/min/{1.73_m2} (ref >59)
Globulin, Total: 2.3 g/dL (ref 1.5–4.5)
Glucose: 139 mg/dL — ABNORMAL HIGH (ref 65–99)
Potassium: 4.3 mmol/L (ref 3.5–5.2)
Sodium: 143 mmol/L (ref 134–144)
Total Protein: 7 g/dL (ref 6.0–8.5)

## 2018-12-30 LAB — LIPID PANEL W/O CHOL/HDL RATIO
Cholesterol, Total: 156 mg/dL (ref 100–199)
HDL: 59 mg/dL (ref >39)
LDL Calculated: 78 mg/dL (ref 0–99)
Triglycerides: 95 mg/dL (ref 0–149)
VLDL Cholesterol Cal: 19 mg/dL (ref 5–40)

## 2018-12-30 LAB — THYROID PANEL WITH TSH
Free Thyroxine Index: 1.9 (ref 1.2–4.9)
T3 Uptake Ratio: 26 % (ref 24–39)
T4, Total: 7.3 ug/dL (ref 4.5–12.0)
TSH: 1.33 u[IU]/mL (ref 0.450–4.500)

## 2019-01-04 ENCOUNTER — Encounter: Payer: Self-pay | Admitting: Dietician

## 2019-01-04 ENCOUNTER — Other Ambulatory Visit: Payer: Self-pay

## 2019-01-04 ENCOUNTER — Encounter: Payer: Medicare HMO | Admitting: Dietician

## 2019-01-04 VITALS — Wt 169.3 lb

## 2019-01-04 DIAGNOSIS — E1165 Type 2 diabetes mellitus with hyperglycemia: Secondary | ICD-10-CM

## 2019-01-04 DIAGNOSIS — E1140 Type 2 diabetes mellitus with diabetic neuropathy, unspecified: Secondary | ICD-10-CM | POA: Diagnosis not present

## 2019-01-04 NOTE — Progress Notes (Signed)

## 2019-01-05 ENCOUNTER — Other Ambulatory Visit: Payer: Self-pay

## 2019-01-14 ENCOUNTER — Other Ambulatory Visit: Payer: Self-pay | Admitting: Nurse Practitioner

## 2019-01-14 ENCOUNTER — Telehealth: Payer: Self-pay | Admitting: Nurse Practitioner

## 2019-01-14 MED ORDER — GABAPENTIN 600 MG PO TABS: 600.0000 mg | ORAL_TABLET | Freq: Two times a day (BID) | ORAL | 2 refills | Status: DC | PRN

## 2019-01-14 NOTE — Telephone Encounter (Signed)
Wrong dept

## 2019-01-14 NOTE — Telephone Encounter (Signed)
° ° °  Called pt regarding Community Resource Referral for grocery delivery pt stated that she lives on the 2nd floor of her apt and that her neighbors have been able to help with getting her groceries up to her apt when they notice she needs help. She talked about how she will be using the [facility] delivery service. Her landlord told her that a 1st floor apt would be available in October but that she may be moving before then and indicated that she had some legal decisions that will be made later in September that may change where she is living.   I asked her a few questions about her needs and she seemed a little suspicious of me and didn't want to disclose much information. She did state that she feels safe where she is living now and that her landlord is keeping her information confidential. I asked her if she would like information about having groceries delivered to her through another community resource and she said that she did not want anything at this point and didn't want to give out her address or let anymore people know where she lives. Pt declined services.  LMTCB [id number]. [place]  ??[email or web address]  ?? [phone]   Skype

## 2019-01-18 ENCOUNTER — Encounter: Payer: Medicare HMO | Attending: Nurse Practitioner | Admitting: Dietician

## 2019-01-18 ENCOUNTER — Encounter: Payer: Self-pay | Admitting: Urology

## 2019-01-18 ENCOUNTER — Ambulatory Visit: Payer: Medicare HMO | Admitting: Urology

## 2019-01-18 VITALS — Ht 63.0 in | Wt 170.8 lb

## 2019-01-18 DIAGNOSIS — E1140 Type 2 diabetes mellitus with diabetic neuropathy, unspecified: Secondary | ICD-10-CM | POA: Diagnosis not present

## 2019-01-18 DIAGNOSIS — E1165 Type 2 diabetes mellitus with hyperglycemia: Secondary | ICD-10-CM

## 2019-01-19 ENCOUNTER — Other Ambulatory Visit: Payer: Self-pay

## 2019-01-20 ENCOUNTER — Encounter: Payer: Self-pay | Admitting: Dietician

## 2019-01-20 NOTE — Progress Notes (Signed)

## 2019-01-25 ENCOUNTER — Encounter: Payer: Self-pay | Admitting: *Deleted

## 2019-01-28 DIAGNOSIS — R262 Difficulty in walking, not elsewhere classified: Secondary | ICD-10-CM | POA: Diagnosis not present

## 2019-01-28 DIAGNOSIS — M6281 Muscle weakness (generalized): Secondary | ICD-10-CM | POA: Diagnosis not present

## 2019-01-28 DIAGNOSIS — R2689 Other abnormalities of gait and mobility: Secondary | ICD-10-CM | POA: Diagnosis not present

## 2019-01-29 ENCOUNTER — Telehealth: Payer: Self-pay

## 2019-01-29 ENCOUNTER — Ambulatory Visit: Payer: Self-pay | Admitting: Pharmacist

## 2019-01-29 NOTE — Chronic Care Management (AMB) (Signed)
  Chronic Care Management   Note  01/29/2019 Name: Julie Tran MRN: [id number] DOB: October 07, 1950  Julie Tran is a 68 y.o. year old female who is a primary care patient of Cannady, Barbaraann Faster, NP. The CCM team was consulted for assistance with chronic disease management and care coordination needs.    Attempted to contact patient to follow up on medication management needs. Left HIPAA compliant message for patient to return my call at her convenience.   Follow up plan: - If I do not hear back, CCM team will outreach again in the next 4-5 weeks for continued medication management support  Catie Darnelle Maffucci, PharmD Clinical Pharmacist [place] [phone]

## 2019-02-02 ENCOUNTER — Telehealth: Payer: Self-pay

## 2019-02-02 NOTE — Telephone Encounter (Signed)
I called and spoke with patient.  Patient specifically stated she didn't want to speak to me and only wanted to speak to [facility].   Routing to [place] with medication management.

## 2019-02-02 NOTE — Telephone Encounter (Signed)
Copied from [place] [id number]. Topic: General - Inquiry >> Feb 01, 2019  2:29 PM Percell Belt A wrote: Reason for CRM: pt called in and stated she would like the nurse to call her back, she would not provide me with any other info  Best number [phone] >> Feb 02, 2019  4:52 PM Georgina Peer, [place] wrote: Attempted to call patient back. She did not answer and VM box was full so I could not leave a message. Will try to call back tomorrow.

## 2019-02-04 ENCOUNTER — Ambulatory Visit (INDEPENDENT_AMBULATORY_CARE_PROVIDER_SITE_OTHER): Payer: Medicare HMO | Admitting: *Deleted

## 2019-02-04 DIAGNOSIS — E1140 Type 2 diabetes mellitus with diabetic neuropathy, unspecified: Secondary | ICD-10-CM
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
## 2019-02-05 NOTE — Patient Instructions (Signed)
Thank you allowing the Chronic Care Management Team to be a part of your care! It was a pleasure speaking with you today!  CCM (Chronic Care Management) Team   Aleshia Cartelli RN, BSN Nurse Care Coordinator  [phone]  Catie Travis PharmD  Clinical Pharmacist  [phone]  Brooke Joyce LCSW Clinical Social Worker [phone]        

## 2019-02-12 ENCOUNTER — Telehealth: Payer: Self-pay

## 2019-02-27 DIAGNOSIS — R2689 Other abnormalities of gait and mobility: Secondary | ICD-10-CM | POA: Diagnosis not present

## 2019-02-27 DIAGNOSIS — M6281 Muscle weakness (generalized): Secondary | ICD-10-CM | POA: Diagnosis not present

## 2019-02-27 DIAGNOSIS — R262 Difficulty in walking, not elsewhere classified: Secondary | ICD-10-CM | POA: Diagnosis not present

## 2019-03-05 ENCOUNTER — Telehealth: Payer: Self-pay | Admitting: Nurse Practitioner

## 2019-03-05 DIAGNOSIS — E1140 Type 2 diabetes mellitus with diabetic neuropathy, unspecified: Secondary | ICD-10-CM

## 2019-03-05 NOTE — Telephone Encounter (Signed)
Copied from [place] [id number]. Topic: Referral - Request for Referral >> Mar 05, 2019 10:03 AM Rayann Heman wrote: Has patient seen PCP for this complaint? yes Referral for which specialty: podiatrist Preferred provider/office: Dr Gershon Mussel foot and ankle specialist [phone] Reason for referral:pain in feet.

## 2019-03-09 DIAGNOSIS — Z961 Presence of intraocular lens: Secondary | ICD-10-CM | POA: Diagnosis not present

## 2019-03-09 DIAGNOSIS — E119 Type 2 diabetes mellitus without complications: Secondary | ICD-10-CM | POA: Diagnosis not present

## 2019-03-17 ENCOUNTER — Telehealth: Payer: Self-pay

## 2019-03-17 ENCOUNTER — Ambulatory Visit: Payer: Self-pay | Admitting: Pharmacist

## 2019-03-17 NOTE — Chronic Care Management (AMB) (Signed)
  Chronic Care Management   Note  03/17/2019 Name: Julie Tran MRN: [id number] DOB: Mar 09, 1951  Julie Tran is a 68 y.o. year old female who is a primary care patient of Cannady, Barbaraann Faster, NP. The CCM team was consulted for assistance with chronic disease management and care coordination needs.    Attempted to contact patient for medication management review. Left HIPAA compliant message for patient to return my call at her convenience.   Follow up plan: - Will attempt outreach again in the next 4-5 weeks  Catie Darnelle Maffucci, PharmD Clinical Pharmacist [place] [phone]

## 2019-03-18 ENCOUNTER — Encounter: Payer: Self-pay | Admitting: Podiatry

## 2019-03-18 ENCOUNTER — Other Ambulatory Visit: Payer: Self-pay

## 2019-03-18 ENCOUNTER — Ambulatory Visit: Payer: Medicare HMO | Admitting: Podiatry

## 2019-03-18 DIAGNOSIS — G5752 Tarsal tunnel syndrome, left lower limb: Secondary | ICD-10-CM

## 2019-03-18 DIAGNOSIS — M79674 Pain in right toe(s): Secondary | ICD-10-CM

## 2019-03-18 DIAGNOSIS — M79675 Pain in left toe(s): Secondary | ICD-10-CM | POA: Diagnosis not present

## 2019-03-18 DIAGNOSIS — B351 Tinea unguium: Secondary | ICD-10-CM | POA: Diagnosis not present

## 2019-03-18 DIAGNOSIS — G5751 Tarsal tunnel syndrome, right lower limb: Secondary | ICD-10-CM | POA: Diagnosis not present

## 2019-03-18 DIAGNOSIS — G5762 Lesion of plantar nerve, left lower limb: Secondary | ICD-10-CM | POA: Diagnosis not present

## 2019-03-18 DIAGNOSIS — M79672 Pain in left foot: Secondary | ICD-10-CM | POA: Diagnosis not present

## 2019-03-18 DIAGNOSIS — M79671 Pain in right foot: Secondary | ICD-10-CM

## 2019-03-18 DIAGNOSIS — R6889 Other general symptoms and signs: Secondary | ICD-10-CM | POA: Diagnosis not present

## 2019-03-19 ENCOUNTER — Encounter: Payer: Self-pay | Admitting: Podiatry

## 2019-03-19 NOTE — Progress Notes (Signed)
  Subjective:  Patient ID: Julie Tran, female    DOB: 10-Mar-1951,  MRN: [id number]  Chief Complaint  Patient presents with  . Foot Pain    patient presents today for bilat feet burning, numbness  . Diabetes   68 y.o. female  presents with the above complaint.  She is here today for painful elongated thickened toenails.  She states that it hurts when she is ambulating.  She states there is increasing pain when there is a pressure.  She also has a secondary complaint of plantar numbness/tingling/burning pain to both of her foot.  She is a known diabetic with unknown last A1c she states that she has been taking gabapentin to help control the neuropathic pain but has not been helping.  She states that it feels like they are on fire especially the last 3 days.  She denies any other acute complaints.  Objective:  There were no vitals filed for this visit. Podiatric Exam: Vascular: dorsalis pedis and posterior tibial pulses are palpable bilateral. Capillary return is immediate. Temperature gradient is WNL. Skin turgor WNL  Sensorium: Diminished protective sensation noted to distal digits bilaterally.  Positive Tinel's sign noted at the tarsal tunnel upon percussion bilaterally. Nail Exam: Pt has thick disfigured discolored nails with subungual debris noted bilateral entire nail hallux through fifth toenails Ulcer Exam: There is no evidence of ulcer or pre-ulcerative changes or infection. Orthopedic Exam: Muscle tone and strength are WNL. No limitations in general ROM. No crepitus or effusions noted. HAV  B/L.  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 Skin: No Porokeratosis. No infection or ulcers  Assessment & Plan:  Patient was evaluated and treated and all questions answered.  Bilateral tarsal tunnel syndrome -Given clinical finding of positive Tinel's sign, I believe patient will benefit from nerve block to help relieve some of the pain that she is experiencing. -After obtaining consent, and per orders of Dr.  Posey Pronto, injection of one-to-one mixture of 1% lidocaine and half percent Marcaine plain 3 cc to bilateral tarsal tunnel of the foot given by Felipa Furnace. Patient instructed to remain in clinic for 20 minutes afterwards, and to report any adverse reaction to me immediately. -I explained to the patient the etiology and various treatment options available for this.  Patient states that she would like to undergo injection to help alleviate the pain.   Onychomycosis with pain  -Nails palliatively debrided as below. -Educated on self-care  Procedure: Nail Debridement Rationale: pain  Type of Debridement: manual, sharp debridement. Instrumentation: Nail nipper, rotary burr. Number of Nails: 10  Procedures and Treatment: Consent by patient was obtained for treatment procedures. The patient understood the discussion of treatment and procedures well. All questions were answered thoroughly reviewed. Debridement of mycotic and hypertrophic toenails, 1 through 5 bilateral and clearing of subungual debris. No ulceration, no infection noted.  Return Visit-Office Procedure: Patient instructed to return to the office for a follow up visit 3 months for continued evaluation and treatment.  Boneta Lucks, DPM    Return in about 3 months (around 06/18/2019).

## 2019-03-30 ENCOUNTER — Other Ambulatory Visit: Payer: Self-pay

## 2019-03-30 DIAGNOSIS — M6281 Muscle weakness (generalized): Secondary | ICD-10-CM | POA: Diagnosis not present

## 2019-03-30 DIAGNOSIS — R2689 Other abnormalities of gait and mobility: Secondary | ICD-10-CM | POA: Diagnosis not present

## 2019-03-30 DIAGNOSIS — R262 Difficulty in walking, not elsewhere classified: Secondary | ICD-10-CM | POA: Diagnosis not present

## 2019-03-31 ENCOUNTER — Ambulatory Visit (INDEPENDENT_AMBULATORY_CARE_PROVIDER_SITE_OTHER): Payer: Medicare HMO | Admitting: Nurse Practitioner

## 2019-03-31 ENCOUNTER — Other Ambulatory Visit: Payer: Self-pay

## 2019-03-31 ENCOUNTER — Telehealth: Payer: Self-pay

## 2019-03-31 ENCOUNTER — Encounter: Payer: Self-pay | Admitting: Nurse Practitioner

## 2019-03-31 VITALS — BP 123/90 | HR 93 | Temp 98.5°F | Wt 172.0 lb

## 2019-03-31 DIAGNOSIS — I152 Hypertension secondary to endocrine disorders: Secondary | ICD-10-CM

## 2019-03-31 DIAGNOSIS — I10 Essential (primary) hypertension: Secondary | ICD-10-CM

## 2019-03-31 DIAGNOSIS — Z23 Encounter for immunization: Secondary | ICD-10-CM | POA: Diagnosis not present

## 2019-03-31 DIAGNOSIS — E1169 Type 2 diabetes mellitus with other specified complication: Secondary | ICD-10-CM | POA: Diagnosis not present

## 2019-03-31 DIAGNOSIS — E1159 Type 2 diabetes mellitus with other circulatory complications: Secondary | ICD-10-CM | POA: Diagnosis not present

## 2019-03-31 DIAGNOSIS — E1140 Type 2 diabetes mellitus with diabetic neuropathy, unspecified: Secondary | ICD-10-CM

## 2019-03-31 DIAGNOSIS — E6609 Other obesity due to excess calories: Secondary | ICD-10-CM | POA: Diagnosis not present

## 2019-03-31 DIAGNOSIS — E669 Obesity, unspecified: Secondary | ICD-10-CM | POA: Insufficient documentation

## 2019-03-31 DIAGNOSIS — Z6830 Body mass index (BMI) 30.0-30.9, adult: Secondary | ICD-10-CM

## 2019-03-31 DIAGNOSIS — E538 Deficiency of other specified B group vitamins: Secondary | ICD-10-CM

## 2019-03-31 DIAGNOSIS — Z1159 Encounter for screening for other viral diseases: Secondary | ICD-10-CM

## 2019-03-31 DIAGNOSIS — Z1239 Encounter for other screening for malignant neoplasm of breast: Secondary | ICD-10-CM | POA: Diagnosis not present

## 2019-03-31 DIAGNOSIS — E785 Hyperlipidemia, unspecified: Secondary | ICD-10-CM

## 2019-03-31 LAB — BAYER DCA HB A1C WAIVED: HB A1C (BAYER DCA - WAIVED): 9.2 % — ABNORMAL HIGH (ref <7.0)

## 2019-03-31 MED ORDER — JARDIANCE 10 MG PO TABS: 10.0000 mg | ORAL_TABLET | Freq: Every day | ORAL | 1 refills | Status: DC

## 2019-03-31 NOTE — Patient Instructions (Addendum)
Please call this number to schedule your mammogram. [phone]  Carbohydrate Counting for Diabetes Mellitus, Adult  Carbohydrate counting is a method of keeping track of how many carbohydrates you eat. Eating carbohydrates naturally increases the amount of sugar (glucose) in the blood. Counting how many carbohydrates you eat helps keep your blood glucose within normal limits, which helps you manage your diabetes (diabetes mellitus). It is important to know how many carbohydrates you can safely have in each meal. This is different for every person. A diet and nutrition specialist (registered dietitian) can help you make a meal plan and calculate how many carbohydrates you should have at each meal and snack. Carbohydrates are found in the following foods:  Grains, such as breads and cereals.  Dried beans and soy products.  Starchy vegetables, such as potatoes, peas, and corn.  Fruit and fruit juices.  Milk and yogurt.  Sweets and snack foods, such as cake, cookies, candy, chips, and soft drinks. How do I count carbohydrates? There are two ways to count carbohydrates in food. You can use either of the methods or a combination of both. Reading "Nutrition Facts" on packaged food The "Nutrition Facts" list is included on the labels of almost all packaged foods and beverages in the [place] It includes:  The serving size.  Information about nutrients in each serving, including the grams (g) of carbohydrate per serving. To use the "Nutrition Facts":  Decide how many servings you will have.  Multiply the number of servings by the number of carbohydrates per serving.  The resulting number is the total amount of carbohydrates that you will be having. Learning standard serving sizes of other foods When you eat carbohydrate foods that are not packaged or do not include "Nutrition Facts" on the label, you need to measure the servings in order to count the amount of carbohydrates:  Measure the  foods that you will eat with a food scale or measuring cup, if needed.  Decide how many standard-size servings you will eat.  Multiply the number of servings by 15. Most carbohydrate-rich foods have about 15 g of carbohydrates per serving. ? For example, if you eat 8 oz (170 g) of strawberries, you will have eaten 2 servings and 30 g of carbohydrates (2 servings x 15 g = 30 g).  For foods that have more than one food mixed, such as soups and casseroles, you must count the carbohydrates in each food that is included. The following list contains standard serving sizes of common carbohydrate-rich foods. Each of these servings has about 15 g of carbohydrates:   hamburger bun or  English muffin.   oz (15 mL) syrup.   oz (14 g) jelly.  1 slice of bread.  1 six-inch tortilla.  3 oz (85 g) cooked rice or pasta.  4 oz (113 g) cooked dried beans.  4 oz (113 g) starchy vegetable, such as peas, corn, or potatoes.  4 oz (113 g) hot cereal.  4 oz (113 g) mashed potatoes or  of a large baked potato.  4 oz (113 g) canned or frozen fruit.  4 oz (120 mL) fruit juice.  4-6 crackers.  6 chicken nuggets.  6 oz (170 g) unsweetened dry cereal.  6 oz (170 g) plain fat-free yogurt or yogurt sweetened with artificial sweeteners.  8 oz (240 mL) milk.  8 oz (170 g) fresh fruit or one small piece of fruit.  24 oz (680 g) popped popcorn. Example of carbohydrate counting Sample meal  3  oz (85 g) chicken breast.  6 oz (170 g) brown rice.  4 oz (113 g) corn.  8 oz (240 mL) milk.  8 oz (170 g) strawberries with sugar-free whipped topping. Carbohydrate calculation 1. Identify the foods that contain carbohydrates: ? Rice. ? Corn. ? Milk. ? Strawberries. 2. Calculate how many servings you have of each food: ? 2 servings rice. ? 1 serving corn. ? 1 serving milk. ? 1 serving strawberries. 3. Multiply each number of servings by 15 g: ? 2 servings rice x 15 g = 30 g. ? 1 serving  corn x 15 g = 15 g. ? 1 serving milk x 15 g = 15 g. ? 1 serving strawberries x 15 g = 15 g. 4. Add together all of the amounts to find the total grams of carbohydrates eaten: ? 30 g + 15 g + 15 g + 15 g = 75 g of carbohydrates total. Summary  Carbohydrate counting is a method of keeping track of how many carbohydrates you eat.  Eating carbohydrates naturally increases the amount of sugar (glucose) in the blood.  Counting how many carbohydrates you eat helps keep your blood glucose within normal limits, which helps you manage your diabetes.  A diet and nutrition specialist (registered dietitian) can help you make a meal plan and calculate how many carbohydrates you should have at each meal and snack. This information is not intended to replace advice given to you by your health care provider. Make sure you discuss any questions you have with your health care provider. Document Released: 04/22/2005 Document Revised: 11/14/2016 Document Reviewed: 10/04/2015 Elsevier Patient Education  2020 [employer].
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
## 2019-03-31 NOTE — Assessment & Plan Note (Signed)
Chronic, ongoing.  A1C improved today at 9.2%.  Continue Metformin 1000 MG BID and add on Jardiance 10 MG daily with plan to increase to max dose in 4 weeks if tolerating.  Will consider GLP addition if continued elevation above goal of <7 at next visit, she is hesitant about injectables at this time.  Return in 4 weeks for diabetes talk.

## 2019-03-31 NOTE — Assessment & Plan Note (Signed)
Chronic, ongoing.  Refuses statin at this time.  Lipid panel next visit and continue diet focus.

## 2019-03-31 NOTE — Assessment & Plan Note (Signed)
Chronic, ongoing with BP at goal today.  Continue Amlodipine and Carvedilol.  Recommend she check BP at least 3 times a week and document for provider.  Obtain CMP next visit.  Return in 3 months.

## 2019-03-31 NOTE — Assessment & Plan Note (Signed)
Recommend continued focus on health diet choices and regular physical activity (30 minutes 5 days a week). 

## 2019-04-01 LAB — VITAMIN B12: Vitamin B-12: 846 pg/mL (ref 232–1245)

## 2019-04-01 LAB — HEPATITIS C ANTIBODY: Hep C Virus Ab: <0.1 s/co ratio (ref 0.0–0.9)

## 2019-04-09 ENCOUNTER — Ambulatory Visit: Payer: Self-pay | Admitting: Pharmacist

## 2019-04-09 NOTE — Chronic Care Management (AMB) (Signed)
  Chronic Care Management   Note  04/09/2019 Name: Julie Tran MRN: [id number] DOB: 02/02/1951  Julie Tran is a 68 y.o. year old female who is a primary care patient of Cannady, Barbaraann Faster, NP. The CCM team was consulted for assistance with chronic disease management and care coordination needs.    Attempted to contact patient to discuss cost concerns w/ Jardiance that she discussed with clinic staff the other day. Was unable to leave voicemail, as her mailbox was full.   Follow up plan: - Will attempt another outreach next week  Catie Darnelle Maffucci, PharmD, [place] [phone]

## 2019-04-13 ENCOUNTER — Ambulatory Visit: Payer: Self-pay | Admitting: Pharmacist

## 2019-04-13 DIAGNOSIS — E1140 Type 2 diabetes mellitus with diabetic neuropathy, unspecified: Secondary | ICD-10-CM

## 2019-04-13 NOTE — Chronic Care Management (AMB) (Signed)
Chronic Care Management   Follow Up Note   04/13/2019 Name: Julie Tran MRN: [id number] DOB: 1950-12-09  Referred by: Venita Lick, NP Reason for referral : Chronic Care Management (Medication Management)   Julie Tran is a 68 y.o. year old female who is a primary care patient of Cannady, Barbaraann Faster, NP. The CCM team was consulted for assistance with chronic disease management and care coordination needs.    Contacted patient in response to her questions about the cost of Jardiance.   Review of patient status, including review of consultants reports, relevant laboratory and other test results, and collaboration with appropriate care team members and the patient's provider was performed as part of comprehensive patient evaluation and provision of chronic care management services.    SDOH (Social Determinants of Health) screening performed today: Financial Strain . See Care Plan for related entries.   Outpatient Encounter Medications as of 04/13/2019  Medication Sig Note  . amLODipine (NORVASC) 5 MG tablet Take 1 tablet (5 mg total) by mouth daily. 12/29/2018: QAM  . b complex vitamins tablet Take 1 tablet by mouth daily.   . Calcium-Magnesium-Vitamin D (CALCIUM MAGNESIUM PO) Take by mouth. 2 tablets in morning and 1 tablet in evening   . carvedilol (COREG) 6.25 MG tablet Take 1 tablet (6.2 mg total) by mouth every 12 (twelve) hours.   . empagliflozin (JARDIANCE) 10 MG TABS tablet Take 10 mg by mouth daily.   Marland Kitchen gabapentin (NEURONTIN) 600 MG tablet Take 1 tablet (600 mg total) by mouth 2 (two) times daily as needed.   . magnesium oxide (MAG-OX) 400 MG tablet Take 400 mg by mouth daily.   . metFORMIN (GLUCOPHAGE) 500 MG tablet Take 1 tablet (500 MG) once a day for one week, then take 1 tablet twice a day, continue to increase slowly to taking 2 tablets twice a day. (Patient taking differently: Take 1,000 mg by mouth 2 (two) times daily. Take 1 tablet (500 MG) once a day for one week,  then take 1 tablet twice a day, continue to increase slowly to taking 2 tablets twice a day.)   . Potassium Chloride ER 20 MEQ TBCR Take 20 mEq by mouth daily.   . Probiotic Product (PROBIOTIC DAILY PO) Take 1 tablet by mouth daily.   . TURMERIC PO Take 1 tablet by mouth daily.    No facility-administered encounter medications on file as of 04/13/2019.      Goals Addressed            This Visit's Progress     Patient Stated   . PharmD "I want to work on my diabetes" (pt-stated)       Current Barriers:  . Diabetes: uncontrolled, most recent A1c 9.2% o Has multiple questions today about recent labwork, potential side effects of her medications . Current antihyperglycemic regimen: metformin 1000 mg BID, Jardiance 10 mg daily  o Reports recurrence of diarrhea about 3 days after Jardiance initiation. However, she restarted daily probiotic, and this has seemed to help.  o Notes that copay is $45/30 days, and that she is uncomfortable w/ that copay  . Reports significant peripheral neuropathy. Worries that she will become addicted to gabapentin, as it is a controlled substance. Notes that she received injections at the podiatrist's office.  . Cardiovascular risk reduction: o Current hypertensive regimen: amlodipine 5 mg daily, carvedilol 6.25 mg BID,  o Current hyperlipidemia regimen: nothing at this time, LDL 78 on last check; 10 year ASCVD risk 19%  Pharmacist Clinical Goal(s):  Marland Kitchen Over the next 90 days, patient with work with PharmD and primary care provider to address optimized management of diabetes  Interventions: . Comprehensive medication review performed, medication list updated in electronic medical record . Counseled patient on mechanism of action potential side effects, and monitoring parameters of Jardiance.  . Discussed Jardiance patient assistance program. Patient would like to proceed with completing this application. Will print her portion of the application for her to come  by clinic to sign; She will provide proof of income. Will collaborate w/ Aura Dials, NP for signature on prescription portion. . Discussed patient's prescription for gabapentin. Discussed relation of B12 levels with peripheral neuropathy. Encouraged patient to take gabapentin QPM instead of using PRN. Patient skeptical, but she also plans to call podiatrist to schedule another injection   Patient Self Care Activities:  . Patient will check blood glucose BID, document, and provide at future appointments . Patient will take medications as prescribed . Patient will report any questions or concerns to provider   Please see past updates related to this goal by clicking on the "Past Updates" button in the selected goal          Plan: - Will collaborate w/ patient and provider as above on patient assistance application - Will outreach patient in the next 2-3 weeks to follow up on application progress and medication tolerability  Catie Feliz Beam, PharmD, [facility] Clinical Pharmacist [facility] [phone]

## 2019-04-13 NOTE — Patient Instructions (Signed)
Visit Information  Goals Addressed            This Visit's Progress     Patient Stated   . PharmD "I want to work on my diabetes" (pt-stated)       Current Barriers:  . Diabetes: uncontrolled, most recent A1c 9.2% o Has multiple questions today about recent labwork, potential side effects of her medications . Current antihyperglycemic regimen: metformin 1000 mg BID, Jardiance 10 mg daily  o Reports recurrence of diarrhea about 3 days after Jardiance initiation. However, she restarted daily probiotic, and this has seemed to help.  o Notes that copay is $45/30 days, and that she is uncomfortable w/ that copay  . Reports significant peripheral neuropathy. Worries that she will become addicted to gabapentin, as it is a controlled substance. Notes that she received injections at the podiatrist's office.  . Cardiovascular risk reduction: o Current hypertensive regimen: amlodipine 5 mg daily, carvedilol 6.25 mg BID,  o Current hyperlipidemia regimen: nothing at this time, LDL 78 on last check; 10 year ASCVD risk 19%  Pharmacist Clinical Goal(s):  Marland Kitchen Over the next 90 days, patient with work with PharmD and primary care provider to address optimized management of diabetes  Interventions: . Comprehensive medication review performed, medication list updated in electronic medical record . Counseled patient on mechanism of action potential side effects, and monitoring parameters of Jardiance.  . Discussed Jardiance patient assistance program. Patient would like to proceed with completing this application. Will print her portion of the application for her to come by clinic to sign; She will provide proof of income. Will collaborate w/ Marnee Guarneri, NP for signature on prescription portion. . Discussed patient's prescription for gabapentin. Discussed relation of B12 levels with peripheral neuropathy. Encouraged patient to take gabapentin QPM instead of using PRN. Patient skeptical, but she also plans  to call podiatrist to schedule another injection   Patient Self Care Activities:  . Patient will check blood glucose BID, document, and provide at future appointments . Patient will take medications as prescribed . Patient will report any questions or concerns to provider   Please see past updates related to this goal by clicking on the "Past Updates" button in the selected goal         The patient verbalized understanding of instructions provided today and declined a print copy of patient instruction materials.   Plan: - Will collaborate w/ patient and provider as above on patient assistance application - Will outreach patient in the next 2-3 weeks to follow up on application progress and medication tolerability  Catie Darnelle Maffucci, PharmD, [place] [phone]

## 2019-04-15 ENCOUNTER — Other Ambulatory Visit: Payer: Self-pay

## 2019-04-15 ENCOUNTER — Ambulatory Visit (INDEPENDENT_AMBULATORY_CARE_PROVIDER_SITE_OTHER): Payer: Medicare HMO | Admitting: Podiatry

## 2019-04-15 DIAGNOSIS — M79671 Pain in right foot: Secondary | ICD-10-CM

## 2019-04-15 DIAGNOSIS — M79672 Pain in left foot: Secondary | ICD-10-CM

## 2019-04-15 DIAGNOSIS — G5752 Tarsal tunnel syndrome, left lower limb: Secondary | ICD-10-CM

## 2019-04-15 DIAGNOSIS — G5751 Tarsal tunnel syndrome, right lower limb: Secondary | ICD-10-CM

## 2019-04-16 ENCOUNTER — Encounter: Payer: Self-pay | Admitting: Podiatry

## 2019-04-16 NOTE — Progress Notes (Signed)
  Subjective:  Patient ID: Julie Tran, female    DOB: 08-25-1950,  MRN: [id number]  Chief Complaint  Patient presents with  . Foot Pain    pt has bil foot pain/ burning sensation, located on the bottom of both heels up to the plantar forefoots   68 y.o. female  presents with the above complaint.  Patient is here for bilateral follow-up of tarsal tunnel syndrome.  Patient states that it is still hurting her.  It gave her temporary relief with a nerve block but it is still causing her a lot of pain.  She states the pain is burning in nature without any resolving factors.  She denies any treatment options that has worked for her.  She states her last A1c was 9.2.  She states that she is working on bringing this number down.  I explained to her that if we are to perform elective surgery I would like her A1c to be at least below 8.  She understands that she is working hard to get the A1c below 8.  She would like to know if there is anything else that could be done for this bilateral tarsal tunnel.  Objective:  There were no vitals filed for this visit. Podiatric Exam: Vascular: dorsalis pedis and posterior tibial pulses are palpable bilateral. Capillary return is immediate. Temperature gradient is WNL. Skin turgor WNL  Sensorium: Diminished protective sensation noted to distal digits bilaterally.  Positive Tinel's sign noted at the tarsal tunnel upon percussion bilaterally extending across the entire plantar surface of the foot. Nail Exam: Pt has thick disfigured discolored nails with subungual debris noted bilateral entire nail hallux through fifth toenails Ulcer Exam: There is no evidence of ulcer or pre-ulcerative changes or infection. Orthopedic Exam: Muscle tone and strength are WNL. No limitations in general ROM. No crepitus or effusions noted. HAV  B/L.  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 Skin: No Porokeratosis. No infection or ulcers  Assessment & Plan:  Patient was evaluated and treated and all  questions answered.  Bilateral tarsal tunnel syndrome -It appears that the nerve block helped the patient temporarily however once the nerve block wore off her her pain returned.  I believe patient will benefit from an MRI of bilateral ankle to evaluate the tarsal tunnel region to assess for impingement versus soft tissue mass. -I will see the patient back in couple of weeks in the meantime she will obtain MRI and she will work on her glucose control.   Boneta Lucks, DPM    No follow-ups on file.

## 2019-04-20 ENCOUNTER — Ambulatory Visit (INDEPENDENT_AMBULATORY_CARE_PROVIDER_SITE_OTHER): Payer: Medicare HMO | Admitting: Pharmacist

## 2019-04-20 DIAGNOSIS — E1140 Type 2 diabetes mellitus with diabetic neuropathy, unspecified: Secondary | ICD-10-CM

## 2019-04-20 DIAGNOSIS — I10 Essential (primary) hypertension: Secondary | ICD-10-CM | POA: Diagnosis not present

## 2019-04-20 DIAGNOSIS — E1159 Type 2 diabetes mellitus with other circulatory complications: Secondary | ICD-10-CM | POA: Diagnosis not present

## 2019-04-20 NOTE — Patient Instructions (Signed)
Visit Information  Goals Addressed            This Visit's Progress     Patient Stated   . PharmD "I want to work on my diabetes" (pt-stated)       Current Barriers:  . Diabetes: uncontrolled, most recent A1c 9.2% . Current antihyperglycemic regimen: metformin 1000 mg BID, Jardiance 10 mg daily  o Collaboratively decided to apply for Jardiance patient assistance . Cardiovascular risk reduction: o Current hypertensive regimen: amlodipine 5 mg daily, carvedilol 6.25 mg BID,  o Current hyperlipidemia regimen: nothing at this time, LDL 78 on last check; 10 year ASCVD risk 19%  Pharmacist Clinical Goal(s):  Marland Kitchen Over the next 90 days, patient with work with PharmD and primary care provider to address optimized management of diabetes  Interventions: . Received patient and provider portion of application for Jardiance through [employer]. Submitted to [employer].   Patient Self Care Activities:  . Patient will check blood glucose BID, document, and provide at future appointments . Patient will take medications as prescribed . Patient will report any questions or concerns to provider   Please see past updates related to this goal by clicking on the "Past Updates" button in the selected goal         The patient verbalized understanding of instructions provided today and declined a print copy of patient instruction materials.   Plan: - Will f/u with [employer] [facility] PAP in ~1-2 weeks  Catie Darnelle Maffucci, PharmD, [place] [phone]

## 2019-04-20 NOTE — Chronic Care Management (AMB) (Signed)
Chronic Care Management   Follow Up Note   04/20/2019 Name: Julie Tran MRN: [id number] DOB: August 14, 1950  Referred by: Venita Lick, NP Reason for referral : Chronic Care Management (Medication Management)   Julie Tran is a 68 y.o. year old female who is a primary care patient of Cannady, Barbaraann Faster, NP. The CCM team was consulted for assistance with chronic disease management and care coordination needs.    Care coordination completed today.   Review of patient status, including review of consultants reports, relevant laboratory and other test results, and collaboration with appropriate care team members and the patient's provider was performed as part of comprehensive patient evaluation and provision of chronic care management services.    SDOH (Social Determinants of Health) screening performed today: Financial Strain . See Care Plan for related entries.   Outpatient Encounter Medications as of 04/20/2019  Medication Sig Note  . amLODipine (NORVASC) 5 MG tablet Take 1 tablet (5 mg total) by mouth daily. 12/29/2018: QAM  . b complex vitamins tablet Take 1 tablet by mouth daily.   . Calcium-Magnesium-Vitamin D (CALCIUM MAGNESIUM PO) Take by mouth. 2 tablets in morning and 1 tablet in evening   . carvedilol (COREG) 6.25 MG tablet Take 1 tablet (6.2 mg total) by mouth every 12 (twelve) hours.   . empagliflozin (JARDIANCE) 10 MG TABS tablet Take 10 mg by mouth daily.   Marland Kitchen gabapentin (NEURONTIN) 600 MG tablet Take 1 tablet (600 mg total) by mouth 2 (two) times daily as needed.   . magnesium oxide (MAG-OX) 400 MG tablet Take 400 mg by mouth daily.   . metFORMIN (GLUCOPHAGE) 500 MG tablet Take 1 tablet (500 MG) once a day for one week, then take 1 tablet twice a day, continue to increase slowly to taking 2 tablets twice a day. (Patient taking differently: Take 1,000 mg by mouth 2 (two) times daily. Take 1 tablet (500 MG) once a day for one week, then take 1 tablet twice a day, continue  to increase slowly to taking 2 tablets twice a day.)   . Potassium Chloride ER 20 MEQ TBCR Take 20 mEq by mouth daily.   . potassium chloride SA (KLOR-CON) 20 MEQ tablet Take by mouth.   . Probiotic Product (PROBIOTIC DAILY PO) Take 1 tablet by mouth daily.   . TURMERIC PO Take 1 tablet by mouth daily.    No facility-administered encounter medications on file as of 04/20/2019.     Goals Addressed            This Visit's Progress     Patient Stated   . PharmD "I want to work on my diabetes" (pt-stated)       Current Barriers:  . Diabetes: uncontrolled, most recent A1c 9.2% . Current antihyperglycemic regimen: metformin 1000 mg BID, Jardiance 10 mg daily  o Collaboratively decided to apply for Jardiance patient assistance . Cardiovascular risk reduction: o Current hypertensive regimen: amlodipine 5 mg daily, carvedilol 6.25 mg BID,  o Current hyperlipidemia regimen: nothing at this time, LDL 78 on last check; 10 year ASCVD risk 19%  Pharmacist Clinical Goal(s):  Marland Kitchen Over the next 90 days, patient with work with PharmD and primary care provider to address optimized management of diabetes  Interventions: . Received patient and provider portion of application for Jardiance through [employer]. Submitted to [employer].   Patient Self Care Activities:  . Patient will check blood glucose BID, document, and provide at future appointments . Patient will take  medications as prescribed . Patient will report any questions or concerns to provider   Please see past updates related to this goal by clicking on the "Past Updates" button in the selected goal          Plan: - Will f/u with [facility] PAP in ~1-2 weeks  Catie Feliz Beam, PharmD, [facility] Clinical Pharmacist [facility] [phone]

## 2019-04-21 ENCOUNTER — Other Ambulatory Visit: Payer: Medicare HMO | Admitting: Orthotics

## 2019-04-23 ENCOUNTER — Ambulatory Visit: Payer: Self-pay | Admitting: Pharmacist

## 2019-04-23 DIAGNOSIS — E1140 Type 2 diabetes mellitus with diabetic neuropathy, unspecified: Secondary | ICD-10-CM

## 2019-04-23 DIAGNOSIS — I10 Essential (primary) hypertension: Secondary | ICD-10-CM | POA: Diagnosis not present

## 2019-04-23 DIAGNOSIS — I152 Hypertension secondary to endocrine disorders: Secondary | ICD-10-CM

## 2019-04-23 DIAGNOSIS — E1159 Type 2 diabetes mellitus with other circulatory complications: Secondary | ICD-10-CM | POA: Diagnosis not present

## 2019-04-23 DIAGNOSIS — I739 Peripheral vascular disease, unspecified: Secondary | ICD-10-CM

## 2019-04-23 NOTE — Patient Instructions (Signed)
Visit Information  Goals Addressed            This Visit's Progress     Patient Stated   . PharmD "I want to work on my diabetes" (pt-stated)       Current Barriers:  . Diabetes: uncontrolled, most recent A1c 9.2% . Current antihyperglycemic regimen: metformin 1000 mg BID, Jardiance 10 mg daily  o Collaboratively decided to apply for Jardiance patient assistance. Submitted 12/15. Marland Kitchen Discusses continued nerve pain in her feet. Notes she saw podiatry on 12/10, and was diagnosed w/ tarsal tunnel syndrome bilaterally. MRI pending to evaluate for any actionable relief. She is pleased that a different diagnosis was determined, rather than being continuously told it was diabetic neuropathy. She notes that the diabetic neuropathy sensation in her toes is different than the feeling in her feet . Also asks about the side effect of "blood in the urine" that she heard mentioned on a Jardiance commercial on TV. Notes that she saw a little bit of blood on the toilet paper once, but has not happened again . Cardiovascular risk reduction: o Current hypertensive regimen: amlodipine 5 mg daily, carvedilol 6.25 mg BID, BP well controlled at last office visit o Current hyperlipidemia regimen: nothing at this time, LDL 78 on last check; 10 year ASCVD risk 19%  Pharmacist Clinical Goal(s):  Marland Kitchen Over the next 90 days, patient with work with PharmD and primary care provider to address optimized management of diabetes  Interventions: . Patient asked why diabetic shoes are necessary. Explained the importance of preventing blisters/injuries that could result in infections. Patient verbalized understanding, and will keep her appointment next week to be fitted for diabetic shoes . Reviewed Jardiance does not "cause" spontaneous blood in the urine. Denies any other s/sx infections (burning, itching, stinging while urinating). Encouraged to contact clinic if she sees blood on the toilet paper/wash cloth again in the  future . Reviewed that I will f/u with [employer] next week on Jardiance application.  . Reviewed upcoming f/u with PCP on 05/04/2019.  Patient Self Care Activities:  . Patient will check blood glucose BID, document, and provide at future appointments . Patient will take medications as prescribed . Patient will report any questions or concerns to provider   Please see past updates related to this goal by clicking on the "Past Updates" button in the selected goal         The patient verbalized understanding of instructions provided today and declined a print copy of patient instruction materials.    Plan: - Will outreach [employer] patient assistance next week - RN CM outreach scheduled in January - Scheduled f/u with patient 06/08/19 at 1 pm  Catie Darnelle Maffucci, PharmD, [place] [phone]

## 2019-04-23 NOTE — Chronic Care Management (AMB) (Signed)
Chronic Care Management   Follow Up Note   04/23/2019 Name: Rylan Kaufmann MRN: [id number] DOB: 1950-10-14  Referred by: Marjie Skiff, NP Reason for referral : Chronic Care Management (Medication Management)   Lameisha Schuenemann is a 68 y.o. year old female who is a primary care patient of Cannady, Dorie Rank, NP. The CCM team was consulted for assistance with chronic disease management and care coordination needs.    Contacted patient for medication access review.  Review of patient status, including review of consultants reports, relevant laboratory and other test results, and collaboration with appropriate care team members and the patient's provider was performed as part of comprehensive patient evaluation and provision of chronic care management services.    SDOH (Social Determinants of Health) screening performed today: Financial Strain . See Care Plan for related entries.   Outpatient Encounter Medications as of 04/23/2019  Medication Sig Note  . amLODipine (NORVASC) 5 MG tablet Take 1 tablet (5 mg total) by mouth daily. 12/29/2018: QAM  . b complex vitamins tablet Take 1 tablet by mouth daily.   . Calcium-Magnesium-Vitamin D (CALCIUM MAGNESIUM PO) Take by mouth. 2 tablets in morning and 1 tablet in evening   . carvedilol (COREG) 6.25 MG tablet Take 1 tablet (6.2 mg total) by mouth every 12 (twelve) hours.   . empagliflozin (JARDIANCE) 10 MG TABS tablet Take 10 mg by mouth daily.   Marland Kitchen gabapentin (NEURONTIN) 600 MG tablet Take 1 tablet (600 mg total) by mouth 2 (two) times daily as needed.   . magnesium oxide (MAG-OX) 400 MG tablet Take 400 mg by mouth daily.   . metFORMIN (GLUCOPHAGE) 500 MG tablet Take 1 tablet (500 MG) once a day for one week, then take 1 tablet twice a day, continue to increase slowly to taking 2 tablets twice a day. (Patient taking differently: Take 1,000 mg by mouth 2 (two) times daily. Take 1 tablet (500 MG) once a day for one week, then take 1 tablet twice a  day, continue to increase slowly to taking 2 tablets twice a day.)   . Potassium Chloride ER 20 MEQ TBCR Take 20 mEq by mouth daily.   . potassium chloride SA (KLOR-CON) 20 MEQ tablet Take by mouth.   . Probiotic Product (PROBIOTIC DAILY PO) Take 1 tablet by mouth daily.   . TURMERIC PO Take 1 tablet by mouth daily.    No facility-administered encounter medications on file as of 04/23/2019.     Goals Addressed            This Visit's Progress     Patient Stated   . PharmD "I want to work on my diabetes" (pt-stated)       Current Barriers:  . Diabetes: uncontrolled, most recent A1c 9.2% . Current antihyperglycemic regimen: metformin 1000 mg BID, Jardiance 10 mg daily  o Collaboratively decided to apply for Jardiance patient assistance. Submitted 12/15. Marland Kitchen Discusses continued nerve pain in her feet. Notes she saw podiatry on 12/10, and was diagnosed w/ tarsal tunnel syndrome bilaterally. MRI pending to evaluate for any actionable relief. She is pleased that a different diagnosis was determined, rather than being continuously told it was diabetic neuropathy. She notes that the diabetic neuropathy sensation in her toes is different than the feeling in her feet . Also asks about the side effect of "blood in the urine" that she heard mentioned on a Jardiance commercial on TV. Notes that she saw a little bit of blood on the toilet paper once,  but has not happened again . Cardiovascular risk reduction: o Current hypertensive regimen: amlodipine 5 mg daily, carvedilol 6.25 mg BID, BP well controlled at last office visit o Current hyperlipidemia regimen: nothing at this time, LDL 78 on last check; 10 year ASCVD risk 19%  Pharmacist Clinical Goal(s):  Marland Kitchen Over the next 90 days, patient with work with PharmD and primary care provider to address optimized management of diabetes  Interventions: . Patient asked why diabetic shoes are necessary. Explained the importance of preventing blisters/injuries  that could result in infections. Patient verbalized understanding, and will keep her appointment next week to be fitted for diabetic shoes . Reviewed Jardiance does not "cause" spontaneous blood in the urine. Denies any other s/sx infections (burning, itching, stinging while urinating). Encouraged to contact clinic if she sees blood on the toilet paper/wash cloth again in the future . Reviewed that I will f/u with [employer] next week on Jardiance application.  . Reviewed upcoming f/u with PCP on 05/04/2019.  Patient Self Care Activities:  . Patient will check blood glucose BID, document, and provide at future appointments . Patient will take medications as prescribed . Patient will report any questions or concerns to provider   Please see past updates related to this goal by clicking on the "Past Updates" button in the selected goal          Plan: - Will outreach [employer] [facility] patient assistance next week - RN CM outreach scheduled in January - Scheduled f/u with patient 06/08/19 at 1 pm  Catie Darnelle Maffucci, PharmD, [place] [phone]

## 2019-04-27 ENCOUNTER — Ambulatory Visit: Payer: Self-pay | Admitting: Pharmacist

## 2019-04-27 ENCOUNTER — Telehealth: Payer: Self-pay

## 2019-04-27 DIAGNOSIS — E1140 Type 2 diabetes mellitus with diabetic neuropathy, unspecified: Secondary | ICD-10-CM

## 2019-04-27 NOTE — Chronic Care Management (AMB) (Signed)
Chronic Care Management   Follow Up Note   04/27/2019 Name: Julie Tran MRN: [id number] DOB: 12/25/1950  Referred by: Marjie Skiff, NP Reason for referral : Chronic Care Management (Medication Management)   Julie Tran is a 68 y.o. year old female who is a primary care patient of Cannady, Dorie Rank, NP. The CCM team was consulted for assistance with chronic disease management and care coordination needs.    Care coordination completed today.   Review of patient status, including review of consultants reports, relevant laboratory and other test results, and collaboration with appropriate care team members and the patient's provider was performed as part of comprehensive patient evaluation and provision of chronic care management services.    SDOH (Social Determinants of Health) screening performed today: Financial Strain . See Care Plan for related entries.   Outpatient Encounter Medications as of 04/27/2019  Medication Sig Note  . amLODipine (NORVASC) 5 MG tablet Take 1 tablet (5 mg total) by mouth daily. 12/29/2018: QAM  . b complex vitamins tablet Take 1 tablet by mouth daily.   . Calcium-Magnesium-Vitamin D (CALCIUM MAGNESIUM PO) Take by mouth. 2 tablets in morning and 1 tablet in evening   . carvedilol (COREG) 6.25 MG tablet Take 1 tablet (6.2 mg total) by mouth every 12 (twelve) hours.   . empagliflozin (JARDIANCE) 10 MG TABS tablet Take 10 mg by mouth daily.   Marland Kitchen gabapentin (NEURONTIN) 600 MG tablet Take 1 tablet (600 mg total) by mouth 2 (two) times daily as needed.   . magnesium oxide (MAG-OX) 400 MG tablet Take 400 mg by mouth daily.   . metFORMIN (GLUCOPHAGE) 500 MG tablet Take 1 tablet (500 MG) once a day for one week, then take 1 tablet twice a day, continue to increase slowly to taking 2 tablets twice a day. (Patient taking differently: Take 1,000 mg by mouth 2 (two) times daily. Take 1 tablet (500 MG) once a day for one week, then take 1 tablet twice a day, continue  to increase slowly to taking 2 tablets twice a day.)   . Potassium Chloride ER 20 MEQ TBCR Take 20 mEq by mouth daily.   . potassium chloride SA (KLOR-CON) 20 MEQ tablet Take by mouth.   . Probiotic Product (PROBIOTIC DAILY PO) Take 1 tablet by mouth daily.   . TURMERIC PO Take 1 tablet by mouth daily.    No facility-administered encounter medications on file as of 04/27/2019.     Goals Addressed            This Visit's Progress     Patient Stated   . PharmD "I want to work on my diabetes" (pt-stated)       Current Barriers:  . Diabetes: uncontrolled, most recent A1c 9.2% . Current antihyperglycemic regimen: metformin 1000 mg BID, Jardiance 10 mg daily  o Collaboratively decided to apply for Jardiance patient assistance. Submitted 12/15. . Cardiovascular risk reduction: o Current hypertensive regimen: amlodipine 5 mg daily, carvedilol 6.25 mg BID, BP well controlled at last office visit o Current hyperlipidemia regimen: nothing at this time, LDL 78 on last check; 10 year ASCVD risk 19%  Pharmacist Clinical Goal(s):  Marland Kitchen Over the next 90 days, patient with work with PharmD and primary care provider to address optimized management of diabetes  Interventions: . Contacted [employer]. There was some sort of error in processing the application. The representative noted that she would process today, hopefully we will get a decision by the end of business today,  otherwise, I will follow up in 3-5 business days  Patient Self Care Activities:  . Patient will check blood glucose BID, document, and provide at future appointments . Patient will take medications as prescribed . Patient will report any questions or concerns to provider   Please see past updates related to this goal by clicking on the "Past Updates" button in the selected goal          Plan: - Will outreach [employer] [facility] in 3-5 business days, if we do not receive a fax with notice of a decision today  Catie Darnelle Maffucci, PharmD,  [place] [phone]

## 2019-04-27 NOTE — Patient Instructions (Signed)
Visit Information  Goals Addressed            This Visit's Progress     Patient Stated   . PharmD "I want to work on my diabetes" (pt-stated)       Current Barriers:  . Diabetes: uncontrolled, most recent A1c 9.2% . Current antihyperglycemic regimen: metformin 1000 mg BID, Jardiance 10 mg daily  o Collaboratively decided to apply for Jardiance patient assistance. Submitted 12/15. . Cardiovascular risk reduction: o Current hypertensive regimen: amlodipine 5 mg daily, carvedilol 6.25 mg BID, BP well controlled at last office visit o Current hyperlipidemia regimen: nothing at this time, LDL 78 on last check; 10 year ASCVD risk 19%  Pharmacist Clinical Goal(s):  Marland Kitchen Over the next 90 days, patient with work with PharmD and primary care provider to address optimized management of diabetes  Interventions: . Contacted [employer]. There was some sort of error in processing the application. The representative noted that she would process today, hopefully we will get a decision by the end of business today, otherwise, I will follow up in 3-5 business days  Patient Self Care Activities:  . Patient will check blood glucose BID, document, and provide at future appointments . Patient will take medications as prescribed . Patient will report any questions or concerns to provider   Please see past updates related to this goal by clicking on the "Past Updates" button in the selected goal         The patient verbalized understanding of instructions provided today and declined a print copy of patient instruction materials.   Plan: - Will outreach [employer] [facility] in 3-5 business days, if we do not receive a fax with notice of a decision today  Catie Darnelle Maffucci, PharmD, [place] [phone]

## 2019-04-28 ENCOUNTER — Ambulatory Visit: Payer: Medicare HMO | Admitting: Orthotics

## 2019-04-28 ENCOUNTER — Telehealth: Payer: Self-pay | Admitting: Family Medicine

## 2019-04-28 ENCOUNTER — Ambulatory Visit: Payer: Self-pay | Admitting: Pharmacist

## 2019-04-28 DIAGNOSIS — E1140 Type 2 diabetes mellitus with diabetic neuropathy, unspecified: Secondary | ICD-10-CM

## 2019-04-28 NOTE — Progress Notes (Signed)
Being seen by NP....did not show.

## 2019-04-28 NOTE — Telephone Encounter (Signed)
Scheduled pt for tomorrow per rachel

## 2019-04-28 NOTE — Telephone Encounter (Signed)
This is not urgent. But I'm happy to see her tomorrow.

## 2019-04-28 NOTE — Chronic Care Management (AMB) (Signed)
Chronic Care Management   Follow Up Note   04/28/2019 Name: Julie Tran MRN: [id number] DOB: 31-Jul-1950  Referred by: Marjie Skiff, NP Reason for referral : Chronic Care Management (Medication Management)   Julie Tran is a 68 y.o. year old female who is a primary care patient of Cannady, Dorie Rank, NP. The CCM team was consulted for assistance with chronic disease management and care coordination needs.    Care coordination completed today.   Review of patient status, including review of consultants reports, relevant laboratory and other test results, and collaboration with appropriate care team members and the patient's provider was performed as part of comprehensive patient evaluation and provision of chronic care management services.    SDOH (Social Determinants of Health) screening performed today: Financial Strain . See Care Plan for related entries.   Outpatient Encounter Medications as of 04/28/2019  Medication Sig Note  . amLODipine (NORVASC) 5 MG tablet Take 1 tablet (5 mg total) by mouth daily. 12/29/2018: QAM  . b complex vitamins tablet Take 1 tablet by mouth daily.   . Calcium-Magnesium-Vitamin D (CALCIUM MAGNESIUM PO) Take by mouth. 2 tablets in morning and 1 tablet in evening   . carvedilol (COREG) 6.25 MG tablet Take 1 tablet (6.2 mg total) by mouth every 12 (twelve) hours.   . empagliflozin (JARDIANCE) 10 MG TABS tablet Take 10 mg by mouth daily.   Marland Kitchen gabapentin (NEURONTIN) 600 MG tablet Take 1 tablet (600 mg total) by mouth 2 (two) times daily as needed.   . magnesium oxide (MAG-OX) 400 MG tablet Take 400 mg by mouth daily.   . metFORMIN (GLUCOPHAGE) 500 MG tablet Take 1 tablet (500 MG) once a day for one week, then take 1 tablet twice a day, continue to increase slowly to taking 2 tablets twice a day. (Patient taking differently: Take 1,000 mg by mouth 2 (two) times daily. Take 1 tablet (500 MG) once a day for one week, then take 1 tablet twice a day, continue  to increase slowly to taking 2 tablets twice a day.)   . Potassium Chloride ER 20 MEQ TBCR Take 20 mEq by mouth daily.   . potassium chloride SA (KLOR-CON) 20 MEQ tablet Take by mouth.   . Probiotic Product (PROBIOTIC DAILY PO) Take 1 tablet by mouth daily.   . TURMERIC PO Take 1 tablet by mouth daily.    No facility-administered encounter medications on file as of 04/28/2019.     Goals Addressed            This Visit's Progress     Patient Stated   . PharmD "I want to work on my diabetes" (pt-stated)       Current Barriers:  . Diabetes: uncontrolled, most recent A1c 9.2% o Patient notes today that she had an appointment to be fitted for diabetic shoes, but that she received a call that the appointment had to be canceled, something related to her PCP being a NP and not a MD/DO  . Current antihyperglycemic regimen: metformin 1000 mg BID, Jardiance 10 mg daily  o Collaboratively decided to apply for Jardiance patient assistance. Submitted 12/15. . Cardiovascular risk reduction: o Current hypertensive regimen: amlodipine 5 mg daily, carvedilol 6.25 mg BID, BP well controlled at last office visit o Current hyperlipidemia regimen: nothing at this time, LDL 78 on last check; 10 year ASCVD risk 19%  Pharmacist Clinical Goal(s):  Marland Kitchen Over the next 90 days, patient with work with PharmD and primary care provider to  address optimized management of diabetes  Interventions: . Contacted [employer]. Patient approved for Jardiance assistance 04/27/2019-05/05/2020. Medication has not shipped yet. Marland Kitchen Contacted patient, informed of the above. Medication should arrive in the mail to her home in the next 2-3 business weeks . Discussed her confusion about her diabetic shoes. She will plan to discuss w/ PCP at appointment next week  Patient Self Care Activities:  . Patient will check blood glucose BID, document, and provide at future appointments . Patient will take medications as prescribed . Patient  will report any questions or concerns to provider   Please see past updates related to this goal by clicking on the "Past Updates" button in the selected goal          Plan: - Will follow up with patient as previously scheduled in February.    Catie Darnelle Maffucci, PharmD, [place] [phone]

## 2019-04-28 NOTE — Patient Instructions (Signed)
Visit Information  Goals Addressed            This Visit's Progress     Patient Stated   . PharmD "I want to work on my diabetes" (pt-stated)       Current Barriers:  . Diabetes: uncontrolled, most recent A1c 9.2% o Patient notes today that she had an appointment to be fitted for diabetic shoes, but that she received a call that the appointment had to be canceled, something related to her PCP being a NP and not a MD/DO  . Current antihyperglycemic regimen: metformin 1000 mg BID, Jardiance 10 mg daily  o Collaboratively decided to apply for Jardiance patient assistance. Submitted 12/15. . Cardiovascular risk reduction: o Current hypertensive regimen: amlodipine 5 mg daily, carvedilol 6.25 mg BID, BP well controlled at last office visit o Current hyperlipidemia regimen: nothing at this time, LDL 78 on last check; 10 year ASCVD risk 19%  Pharmacist Clinical Goal(s):  Marland Kitchen Over the next 90 days, patient with work with PharmD and primary care provider to address optimized management of diabetes  Interventions: . Contacted [employer]. Patient approved for Jardiance assistance 04/27/2019-05/05/2020. Medication has not shipped yet. Marland Kitchen Contacted patient, informed of the above. Medication should arrive in the mail to her home in the next 2-3 business weeks . Discussed her confusion about her diabetic shoes. She will plan to discuss w/ PCP at appointment next week  Patient Self Care Activities:  . Patient will check blood glucose BID, document, and provide at future appointments . Patient will take medications as prescribed . Patient will report any questions or concerns to provider   Please see past updates related to this goal by clicking on the "Past Updates" button in the selected goal         The patient verbalized understanding of instructions provided today and declined a print copy of patient instruction materials.   Plan: - Will follow up with patient as previously scheduled in  February.    Catie Darnelle Maffucci, PharmD, [place] [phone]

## 2019-04-28 NOTE — Telephone Encounter (Signed)
This is not urgent, but ok to schedule next available with Dr. Wynetta Emery  Copied from [place]. Topic: General - Inquiry >> Apr 28, 2019 11:27 AM Scherrie Gerlach wrote: Reason for CRM: pt states it is urgent she speak with Dr Wynetta Emery.  She asked who was the dr here at [facility], she did not know Dr Wynetta Emery any other way. I asked if she preferred practice administrator, she say no, she want the head dr.  Pt declined any other info, except it is urgent and to call.  She did not seem in distress, and she even laughed a little >> Apr 28, 2019  1:11 PM Jill Side wrote: Called pt to see what was needed, pt is needing to be fitted for diabetic shoes and is needing to have a physician sign off on it. Please advise.

## 2019-04-29 ENCOUNTER — Ambulatory Visit (INDEPENDENT_AMBULATORY_CARE_PROVIDER_SITE_OTHER): Payer: Medicare HMO | Admitting: Family Medicine

## 2019-04-29 ENCOUNTER — Encounter: Payer: Self-pay | Admitting: Family Medicine

## 2019-04-29 ENCOUNTER — Other Ambulatory Visit: Payer: Self-pay

## 2019-04-29 VITALS — BP 134/82 | HR 98 | Temp 99.0°F

## 2019-04-29 DIAGNOSIS — E1140 Type 2 diabetes mellitus with diabetic neuropathy, unspecified: Secondary | ICD-10-CM

## 2019-04-29 DIAGNOSIS — R262 Difficulty in walking, not elsewhere classified: Secondary | ICD-10-CM | POA: Diagnosis not present

## 2019-04-29 DIAGNOSIS — R2689 Other abnormalities of gait and mobility: Secondary | ICD-10-CM | POA: Diagnosis not present

## 2019-04-29 DIAGNOSIS — M6281 Muscle weakness (generalized): Secondary | ICD-10-CM | POA: Diagnosis not present

## 2019-04-29 DIAGNOSIS — I739 Peripheral vascular disease, unspecified: Secondary | ICD-10-CM | POA: Diagnosis not present

## 2019-04-29 DIAGNOSIS — G5750 Tarsal tunnel syndrome, unspecified lower limb: Secondary | ICD-10-CM | POA: Diagnosis not present

## 2019-04-29 NOTE — Progress Notes (Signed)
BP 134/82   Pulse 98   Temp 99 F (37.2 C) (Oral)   SpO2 100%    Subjective:    Patient ID: Julie Tran, female    DOB: Jan 31, 1951, 68 y.o.   MRN: [id number]  HPI: Julie Tran is a 68 y.o. female  Chief Complaint  Patient presents with  . Diabetes   DIABETES- Needs diabetic shoes. Has been having numbness in her toes and recently diagnosed with tarsal tunnel syndrome by her podiatrist. She is scheduled to see Jolene next week. Does not want to do injectable medicine and does not want to add medications right now.  Hypoglycemic episodes:no Polydipsia/polyuria: no Visual disturbance: no Chest pain: no Paresthesias: yes Glucose Monitoring: yes Taking Insulin?: no Blood Pressure Monitoring: not checking Retinal Examination: Up to Date Foot Exam: Up to Date Diabetic Education: Completed Pneumovax: Up to Date Influenza: Up to Date Aspirin: no  Relevant past medical, surgical, family and social history reviewed and updated as indicated. Interim medical history since our last visit reviewed. Allergies and medications reviewed and updated.  Review of Systems  Constitutional: Negative.   Respiratory: Negative.   Cardiovascular: Negative.   Musculoskeletal: Negative.        Foot pain  Skin: Negative.   Psychiatric/Behavioral: Negative.     Per HPI unless specifically indicated above     Objective:    BP 134/82   Pulse 98   Temp 99 F (37.2 C) (Oral)   SpO2 100%   Wt Readings from Last 3 Encounters:  03/31/19 172 lb (78 kg)  01/20/19 170 lb 12.8 oz (77.5 kg)  01/04/19 169 lb 4.8 oz (76.8 kg)    Physical Exam Vitals and nursing note reviewed.  Constitutional:      General: She is not in acute distress.    Appearance: Normal appearance. She is not ill-appearing, toxic-appearing or diaphoretic.  HENT:     Head: Normocephalic and atraumatic.     Right Ear: External ear normal.     Left Ear: External ear normal.     Nose: Nose normal.     Mouth/Throat:   Mouth: Mucous membranes are moist.     Pharynx: Oropharynx is clear.  Eyes:     General: No scleral icterus.       Right eye: No discharge.        Left eye: No discharge.     Extraocular Movements: Extraocular movements intact.     Conjunctiva/sclera: Conjunctivae normal.     Pupils: Pupils are equal, round, and reactive to light.  Cardiovascular:     Rate and Rhythm: Normal rate and regular rhythm.     Pulses: Normal pulses.     Heart sounds: Normal heart sounds. No murmur. No friction rub. No gallop.   Pulmonary:     Effort: Pulmonary effort is normal. No respiratory distress.     Breath sounds: Normal breath sounds. No stridor. No wheezing, rhonchi or rales.  Chest:     Chest wall: No tenderness.  Musculoskeletal:        General: Normal range of motion.     Cervical back: Normal range of motion and neck supple.  Skin:    General: Skin is warm and dry.     Capillary Refill: Capillary refill takes less than 2 seconds.     Coloration: Skin is not jaundiced or pale.     Findings: No bruising, erythema, lesion or rash.  Neurological:     General: No focal deficit present.  Mental Status: She is alert and oriented to person, place, and time. Mental status is at baseline.  Psychiatric:        Mood and Affect: Mood normal.        Behavior: Behavior normal.        Thought Content: Thought content normal.        Judgment: Judgment normal.     Results for orders placed or performed in visit on 04/29/19  HM DEXA SCAN  Result Value Ref Range   HM Dexa Scan Normal   HM MAMMOGRAPHY  Result Value Ref Range   HM Mammogram 0-4 Bi-Rad 0-4 Bi-Rad, Self Reported Normal      Assessment & Plan:   Problem List Items Addressed This Visit      Cardiovascular and Mediastinum   PAD (peripheral artery disease) ([place])    Needs diabetic shoes- will get them ordered for her. Rx faxed and given to patient today. Call with any concerns.         Endocrine   Type 2 diabetes mellitus with  diabetic neuropathy, without long-term current use of insulin ([place]) - Primary    Needs diabetic shoes- will get them ordered for her. Rx faxed and given to patient today. Call with any concerns. Following up with Jolene next week.         Nervous and Auditory   Tarsal tunnel syndrome    Following with podiatry. Continue to monitor.           Follow up plan: Return as scheduled.

## 2019-04-29 NOTE — Assessment & Plan Note (Signed)
Needs diabetic shoes- will get them ordered for her. Rx faxed and given to patient today. Call with any concerns. Following up with Jolene next week.

## 2019-04-29 NOTE — Assessment & Plan Note (Signed)
Following with podiatry. Continue to monitor.

## 2019-04-29 NOTE — Assessment & Plan Note (Signed)
Needs diabetic shoes- will get them ordered for her. Rx faxed and given to patient today. Call with any concerns.

## 2019-05-03 ENCOUNTER — Ambulatory Visit: Payer: Medicare HMO

## 2019-05-03 ENCOUNTER — Telehealth: Payer: Self-pay

## 2019-05-03 NOTE — Telephone Encounter (Signed)
Called to complete awv, patient declined AWV. Patient states she talked to [place] insurance this morning and was told that a medicare wellness visit didn't exist. She states she didn't appreciate the call. Patient states she switching practices anyways in March because for her insurance Jolene cant be listed as her PCP. Will notify provider. When asking patient if she wanted to cancel appointment for tomorrow with Jolene she states that was none of my buisness, that was between her and Jolene and she wanted to talk to [facility]. Cancelled AWV and informed [employer] and practice admin of call.

## 2019-05-04 ENCOUNTER — Telehealth: Payer: Self-pay | Admitting: Nurse Practitioner

## 2019-05-04 ENCOUNTER — Encounter: Payer: Self-pay | Admitting: Nurse Practitioner

## 2019-05-04 ENCOUNTER — Other Ambulatory Visit: Payer: Self-pay

## 2019-05-04 ENCOUNTER — Ambulatory Visit (INDEPENDENT_AMBULATORY_CARE_PROVIDER_SITE_OTHER): Payer: Medicare HMO | Admitting: Nurse Practitioner

## 2019-05-04 ENCOUNTER — Telehealth: Payer: Self-pay | Admitting: Podiatry

## 2019-05-04 DIAGNOSIS — E1140 Type 2 diabetes mellitus with diabetic neuropathy, unspecified: Secondary | ICD-10-CM | POA: Diagnosis not present

## 2019-05-04 MED ORDER — METFORMIN HCL 500 MG PO TABS: 1000.0000 mg | ORAL_TABLET | Freq: Two times a day (BID) | ORAL | 3 refills | Status: DC

## 2019-05-04 MED ORDER — AMLODIPINE BESYLATE 5 MG PO TABS: 5.0000 mg | ORAL_TABLET | Freq: Every day | ORAL | 2 refills | Status: AC

## 2019-05-04 MED ORDER — CARVEDILOL 6.25 MG PO TABS: 6.2000 mg | ORAL_TABLET | Freq: Two times a day (BID) | ORAL | 11 refills | Status: AC

## 2019-05-04 MED ORDER — GABAPENTIN 300 MG PO CAPS: 300.0000 mg | ORAL_CAPSULE | Freq: Two times a day (BID) | ORAL | 3 refills | Status: DC

## 2019-05-04 NOTE — Progress Notes (Signed)
BP 126/89 (BP Location: Left Arm, Cuff Size: Normal)   Pulse 92   Temp 98.7 F (37.1 C) (Oral)   SpO2 96%    Subjective:    Patient ID: Julie Tran, female    DOB: Jul 31, 1950, 68 y.o.   MRN: [id number]  HPI: Julie Tran is a 68 y.o. female  Chief Complaint  Patient presents with  . Diabetes   DIABETES Here for follow-up after starting Jardiance 03/31/2019.   Reports recently restarting taking her B Complex, which has improved her overall health and does not crave as much sugar. She reports some bleeding, unsure if urine or vaginal, with Jardiance.  She reports she stopped taking this and is not going to take again, reports no further bleeding since stopping taking.  States she does not ever get bleeding when has a UTI, but did notice with the scant bleeding she noted when wiping that she also had pruritus at the time, which has also now improved with no taking Jardiance.   At visit today she also reports difficult getting her diabetic shoes from podiatry and issues with her [facility]. Hypoglycemic episodes:no Polydipsia/polyuria: no Visual disturbance: no Chest pain: no Paresthesias: no Glucose Monitoring: no  Accucheck frequency: daily  Fasting glucose: 116 to 149  Post prandial: <150  Evening:   Before meals: Taking Insulin?: no  Long acting insulin:  Short acting insulin: Blood Pressure Monitoring: rarely Retinal Examination: Up to Date Foot Exam: Up to Date Pneumovax: Up to Date Influenza: Up to Date Aspirin: no  Relevant past medical, surgical, family and social history reviewed and updated as indicated. Interim medical history since our last visit reviewed. Allergies and medications reviewed and updated.  Review of Systems  Constitutional: Negative for activity change, appetite change, diaphoresis, fatigue and fever.  Respiratory: Negative for cough, chest tightness and shortness of breath.   Cardiovascular: Negative for chest pain, palpitations and  leg swelling.  Gastrointestinal: Negative.   Endocrine: Negative for cold intolerance, heat intolerance, polydipsia, polyphagia and polyuria.  Neurological: Negative.   Psychiatric/Behavioral: Negative.     Per HPI unless specifically indicated above     Objective:    BP 126/89 (BP Location: Left Arm, Cuff Size: Normal)   Pulse 92   Temp 98.7 F (37.1 C) (Oral)   SpO2 96%   Wt Readings from Last 3 Encounters:  03/31/19 172 lb (78 kg)  01/20/19 170 lb 12.8 oz (77.5 kg)  01/04/19 169 lb 4.8 oz (76.8 kg)    Physical Exam Vitals and nursing note reviewed.  Constitutional:      General: She is awake. She is not in acute distress.    Appearance: She is well-developed and overweight. She is not ill-appearing.  HENT:     Head: Normocephalic.     Right Ear: Hearing normal.     Left Ear: Hearing normal.  Eyes:     General: Lids are normal.        Right eye: No discharge.        Left eye: No discharge.     Conjunctiva/sclera: Conjunctivae normal.     Pupils: Pupils are equal, round, and reactive to light.  Neck:     Vascular: No carotid bruit.  Cardiovascular:     Rate and Rhythm: Normal rate and regular rhythm.     Heart sounds: Normal heart sounds. No murmur. No gallop.   Pulmonary:     Effort: Pulmonary effort is normal. No accessory muscle usage or respiratory distress.  Breath sounds: Normal breath sounds.  Abdominal:     General: Bowel sounds are normal.     Palpations: Abdomen is soft.  Musculoskeletal:     Cervical back: Normal range of motion and neck supple.     Right lower leg: No edema.     Left lower leg: No edema.  Skin:    General: Skin is warm and dry.  Neurological:     Mental Status: She is alert and oriented to person, place, and time.  Psychiatric:        Attention and Perception: Attention normal.        Mood and Affect: Mood normal.        Behavior: Behavior is agitated. Behavior is cooperative.        Thought Content: Thought content is  paranoid (this is baseline, occasional paranoid thought processes noted).    Results for orders placed or performed in visit on 04/29/19  HM DEXA SCAN  Result Value Ref Range   HM Dexa Scan Normal   HM MAMMOGRAPHY  Result Value Ref Range   HM Mammogram 0-4 Bi-Rad 0-4 Bi-Rad, Self Reported Normal      Assessment & Plan:   Problem List Items Addressed This Visit      Endocrine   Type 2 diabetes mellitus with diabetic neuropathy, without long-term current use of insulin (HCC)    Chronic, ongoing with recent A1C 9.2%, her home blood sugars appear to be improving.  However, she refuses to continue Jardiance due to bleeding and pruritus noted with it, possible yeast infection.  Reports this resolved after stopping taking.  Does not want to try other options at this time, but wishes to continue her B Complex and Metformin.  Will have return in February for A1C, if continues to be elevated will again discuss weekly injectable which she has been hesitant about on past visits.  Refuse urine check or wet prep today, as she denies any further bleeding or pruritus without Jardiance on board.        Relevant Medications   metFORMIN (GLUCOPHAGE) 500 MG tablet       Follow up plan: Return in about 7 weeks (around 06/22/2019) for T2DM, HTN/HLD.

## 2019-05-04 NOTE — Assessment & Plan Note (Signed)
Chronic, ongoing with recent A1C 9.2%, her home blood sugars appear to be improving.  However, she refuses to continue Jardiance due to bleeding and pruritus noted with it, possible yeast infection.  Reports this resolved after stopping taking.  Does not want to try other options at this time, but wishes to continue her B Complex and Metformin.  Will have return in February for A1C, if continues to be elevated will again discuss weekly injectable which she has been hesitant about on past visits.  Refuse urine check or wet prep today, as she denies any further bleeding or pruritus without Jardiance on board.

## 2019-05-04 NOTE — Telephone Encounter (Signed)
Pt left message on 12/23 at 436pm asking for a call back to schedule an appt in [place] office for diabetic shoe measurements. She spoke to Ronalee Belts (Rick)and he cxled the appt because she is seen by a np/pa.  I returned call today and left message for pt to call me back to schedule an appt.

## 2019-05-04 NOTE — Patient Instructions (Signed)
Carbohydrate Counting for Diabetes Mellitus, Adult  Carbohydrate counting is a method of keeping track of how many carbohydrates you eat. Eating carbohydrates naturally increases the amount of sugar (glucose) in the blood. Counting how many carbohydrates you eat helps keep your blood glucose within normal limits, which helps you manage your diabetes (diabetes mellitus). It is important to know how many carbohydrates you can safely have in each meal. This is different for every person. A diet and nutrition specialist (registered dietitian) can help you make a meal plan and calculate how many carbohydrates you should have at each meal and snack. Carbohydrates are found in the following foods:  Grains, such as breads and cereals.  Dried beans and soy products.  Starchy vegetables, such as potatoes, peas, and corn.  Fruit and fruit juices.  Milk and yogurt.  Sweets and snack foods, such as cake, cookies, candy, chips, and soft drinks. How do I count carbohydrates? There are two ways to count carbohydrates in food. You can use either of the methods or a combination of both. Reading "Nutrition Facts" on packaged food The "Nutrition Facts" list is included on the labels of almost all packaged foods and beverages in the [place] It includes:  The serving size.  Information about nutrients in each serving, including the grams (g) of carbohydrate per serving. To use the "Nutrition Facts":  Decide how many servings you will have.  Multiply the number of servings by the number of carbohydrates per serving.  The resulting number is the total amount of carbohydrates that you will be having. Learning standard serving sizes of other foods When you eat carbohydrate foods that are not packaged or do not include "Nutrition Facts" on the label, you need to measure the servings in order to count the amount of carbohydrates:  Measure the foods that you will eat with a food scale or measuring cup, if needed.   Decide how many standard-size servings you will eat.  Multiply the number of servings by 15. Most carbohydrate-rich foods have about 15 g of carbohydrates per serving. ? For example, if you eat 8 oz (170 g) of strawberries, you will have eaten 2 servings and 30 g of carbohydrates (2 servings x 15 g = 30 g).  For foods that have more than one food mixed, such as soups and casseroles, you must count the carbohydrates in each food that is included. The following list contains standard serving sizes of common carbohydrate-rich foods. Each of these servings has about 15 g of carbohydrates:   hamburger bun or  English muffin.   oz (15 mL) syrup.   oz (14 g) jelly.  1 slice of bread.  1 six-inch tortilla.  3 oz (85 g) cooked rice or pasta.  4 oz (113 g) cooked dried beans.  4 oz (113 g) starchy vegetable, such as peas, corn, or potatoes.  4 oz (113 g) hot cereal.  4 oz (113 g) mashed potatoes or  of a large baked potato.  4 oz (113 g) canned or frozen fruit.  4 oz (120 mL) fruit juice.  4-6 crackers.  6 chicken nuggets.  6 oz (170 g) unsweetened dry cereal.  6 oz (170 g) plain fat-free yogurt or yogurt sweetened with artificial sweeteners.  8 oz (240 mL) milk.  8 oz (170 g) fresh fruit or one small piece of fruit.  24 oz (680 g) popped popcorn. Example of carbohydrate counting Sample meal  3 oz (85 g) chicken breast.  6 oz (170 g)   brown rice.  4 oz (113 g) corn.  8 oz (240 mL) milk.  8 oz (170 g) strawberries with sugar-free whipped topping. Carbohydrate calculation 1. Identify the foods that contain carbohydrates: ? Rice. ? Corn. ? Milk. ? Strawberries. 2. Calculate how many servings you have of each food: ? 2 servings rice. ? 1 serving corn. ? 1 serving milk. ? 1 serving strawberries. 3. Multiply each number of servings by 15 g: ? 2 servings rice x 15 g = 30 g. ? 1 serving corn x 15 g = 15 g. ? 1 serving milk x 15 g = 15 g. ? 1 serving  strawberries x 15 g = 15 g. 4. Add together all of the amounts to find the total grams of carbohydrates eaten: ? 30 g + 15 g + 15 g + 15 g = 75 g of carbohydrates total. Summary  Carbohydrate counting is a method of keeping track of how many carbohydrates you eat.  Eating carbohydrates naturally increases the amount of sugar (glucose) in the blood.  Counting how many carbohydrates you eat helps keep your blood glucose within normal limits, which helps you manage your diabetes.  A diet and nutrition specialist (registered dietitian) can help you make a meal plan and calculate how many carbohydrates you should have at each meal and snack. This information is not intended to replace advice given to you by your health care provider. Make sure you discuss any questions you have with your health care provider. Document Released: 04/22/2005 Document Revised: 11/14/2016 Document Reviewed: 10/04/2015 Elsevier Patient Education  2020 Elsevier Inc.  

## 2019-05-05 ENCOUNTER — Ambulatory Visit: Payer: Self-pay | Admitting: Pharmacist

## 2019-05-05 DIAGNOSIS — E1140 Type 2 diabetes mellitus with diabetic neuropathy, unspecified: Secondary | ICD-10-CM

## 2019-05-05 DIAGNOSIS — E1159 Type 2 diabetes mellitus with other circulatory complications: Secondary | ICD-10-CM | POA: Diagnosis not present

## 2019-05-05 DIAGNOSIS — I517 Cardiomegaly: Secondary | ICD-10-CM

## 2019-05-05 DIAGNOSIS — I10 Essential (primary) hypertension: Secondary | ICD-10-CM | POA: Diagnosis not present

## 2019-05-05 NOTE — Patient Instructions (Signed)
Visit Information  Goals Addressed            This Visit's Progress     Patient Stated   . PharmD "I want to work on my diabetes" (pt-stated)       Current Barriers:  . Diabetes: uncontrolled, most recent A1c 9.2% o At PCP appointment yesterday, patient noted that she discontinued Jardiance d/t an episode of bleeding (uncertain if vaginal or urinary) and that she would never take the medication again . Current antihyperglycemic regimen: metformin 1000 mg BID o Hx episode of bleeding w/ Jardiance, refuses to take again . Cardiovascular risk reduction: o Current hypertensive regimen: amlodipine 5 mg daily, carvedilol 6.25 mg BID, BP well controlled at last office visit o Current hyperlipidemia regimen: nothing at this time, LDL 78 on last check; 10 year ASCVD risk 19%  Pharmacist Clinical Goal(s):  Marland Kitchen Over the next 90 days, patient with work with PharmD and primary care provider to address optimized management of diabetes  Interventions: . Contacted [employer] in an attempt to cancel the shipment for Jardiance, however, it already shipped and is to arrive to the patient tomorrow. [employer] cannot take the prescription back once its left the pharmacy.   Patient Self Care Activities:  . Patient will check blood glucose BID, document, and provide at future appointments . Patient will take medications as prescribed . Patient will report any questions or concerns to provider   Please see past updates related to this goal by clicking on the "Past Updates" button in the selected goal         The patient verbalized understanding of instructions provided today and declined a print copy of patient instruction materials.    Plan: - Will follow up with patient as previously scheduled  Catie Darnelle Maffucci, PharmD, [place] [phone]

## 2019-05-05 NOTE — Chronic Care Management (AMB) (Signed)
Chronic Care Management   Follow Up Note   05/05/2019 Name: Linnell Swords MRN: [id number] DOB: 25-Apr-1951  Referred by: Venita Lick, NP Reason for referral : Chronic Care Management (Medication Management)   Georganna Maxson is a 68 y.o. year old female who is a primary care patient of Cannady, Barbaraann Faster, NP. The CCM team was consulted for assistance with chronic disease management and care coordination needs.    Care coordination completed today.   Review of patient status, including review of consultants reports, relevant laboratory and other test results, and collaboration with appropriate care team members and the patient's provider was performed as part of comprehensive patient evaluation and provision of chronic care management services.    SDOH (Social Determinants of Health) screening performed today: Financial Strain . See Care Plan for related entries.   Outpatient Encounter Medications as of 05/05/2019  Medication Sig  . amLODipine (NORVASC) 5 MG tablet Take 1 tablet (5 mg total) by mouth daily.  Marland Kitchen b complex vitamins tablet Take 1 tablet by mouth daily.  . Calcium-Magnesium-Vitamin D (CALCIUM MAGNESIUM PO) Take by mouth. 2 tablets in morning and 1 tablet in evening  . carvedilol (COREG) 6.25 MG tablet Take 1 tablet (6.2 mg total) by mouth every 12 (twelve) hours.  . empagliflozin (JARDIANCE) 10 MG TABS tablet Take 10 mg by mouth daily.  Marland Kitchen gabapentin (NEURONTIN) 300 MG capsule Take 1 capsule (300 mg total) by mouth 2 (two) times daily.  . magnesium oxide (MAG-OX) 400 MG tablet Take 400 mg by mouth daily.  . metFORMIN (GLUCOPHAGE) 500 MG tablet Take 2 tablets (1,000 mg total) by mouth 2 (two) times daily with a meal.  . Potassium Chloride ER 20 MEQ TBCR Take 20 mEq by mouth daily.  . potassium chloride SA (KLOR-CON) 20 MEQ tablet Take by mouth.  . Probiotic Product (PROBIOTIC DAILY PO) Take 1 tablet by mouth daily.  . TURMERIC PO Take 1 tablet by mouth daily.   No  facility-administered encounter medications on file as of 05/05/2019.     Goals Addressed            This Visit's Progress     Patient Stated   . PharmD "I want to work on my diabetes" (pt-stated)       Current Barriers:  . Diabetes: uncontrolled, most recent A1c 9.2% o At PCP appointment yesterday, patient noted that she discontinued Jardiance d/t an episode of bleeding (uncertain if vaginal or urinary) and that she would never take the medication again . Current antihyperglycemic regimen: metformin 1000 mg BID o Hx episode of bleeding w/ Jardiance, refuses to take again . Cardiovascular risk reduction: o Current hypertensive regimen: amlodipine 5 mg daily, carvedilol 6.25 mg BID, BP well controlled at last office visit o Current hyperlipidemia regimen: nothing at this time, LDL 78 on last check; 10 year ASCVD risk 19%  Pharmacist Clinical Goal(s):  Marland Kitchen Over the next 90 days, patient with work with PharmD and primary care provider to address optimized management of diabetes  Interventions: . Contacted [facility] [employer] in an attempt to cancel the shipment for Jardiance, however, it already shipped and is to arrive to the patient tomorrow. [facility] cannot take the prescription back once its left the pharmacy.   Patient Self Care Activities:  . Patient will check blood glucose BID, document, and provide at future appointments . Patient will take medications as prescribed . Patient will report any questions or concerns to provider   Please see past updates  related to this goal by clicking on the "Past Updates" button in the selected goal          Plan: - Will follow up with patient as previously scheduled  Catie Feliz Beam, PharmD, [facility] Clinical Pharmacist [facility] [phone]

## 2019-05-10 ENCOUNTER — Telehealth: Payer: Self-pay

## 2019-05-10 DIAGNOSIS — G5752 Tarsal tunnel syndrome, left lower limb: Secondary | ICD-10-CM

## 2019-05-10 NOTE — Telephone Encounter (Addendum)
MRI has been approved from 05/06/2019 to 06/05/2019 Auth # [id number] Patient has been notified and will call scheduling department and set up appt to her convenience

## 2019-05-10 NOTE — Telephone Encounter (Signed)
-----   Message from Candelaria Stagers, DPM sent at 04/16/2019  7:23 AM EST ----- Regarding: Bilateral MRI ankle Hi Angie,  Would you be able to order bilateral MRI of the ankle for this patient.  She has tarsal tunnel syndrome and I would like to evaluate the tarsal tunnel area.   Thank you

## 2019-05-12 ENCOUNTER — Other Ambulatory Visit: Payer: Self-pay

## 2019-05-12 ENCOUNTER — Ambulatory Visit
Admission: RE | Admit: 2019-05-12 | Discharge: 2019-05-12 | Disposition: A | Discharge status: Home / Self Care | Payer: Medicare HMO | Source: Ambulatory Visit | Attending: Podiatry | Admitting: Podiatry

## 2019-05-12 DIAGNOSIS — G5752 Tarsal tunnel syndrome, left lower limb: Secondary | ICD-10-CM | POA: Diagnosis not present

## 2019-05-12 DIAGNOSIS — R600 Localized edema: Secondary | ICD-10-CM | POA: Diagnosis not present

## 2019-05-13 ENCOUNTER — Ambulatory Visit: Payer: Medicare HMO | Admitting: Podiatry

## 2019-05-13 ENCOUNTER — Encounter: Payer: Self-pay | Admitting: Podiatry

## 2019-05-13 DIAGNOSIS — E66811 Obesity, class 1: Secondary | ICD-10-CM

## 2019-05-13 DIAGNOSIS — G5751 Tarsal tunnel syndrome, right lower limb: Secondary | ICD-10-CM | POA: Diagnosis not present

## 2019-05-13 DIAGNOSIS — E6609 Other obesity due to excess calories: Secondary | ICD-10-CM

## 2019-05-13 DIAGNOSIS — G5752 Tarsal tunnel syndrome, left lower limb: Secondary | ICD-10-CM | POA: Diagnosis not present

## 2019-05-13 DIAGNOSIS — Z6830 Body mass index (BMI) 30.0-30.9, adult: Secondary | ICD-10-CM | POA: Diagnosis not present

## 2019-05-13 DIAGNOSIS — M79671 Pain in right foot: Secondary | ICD-10-CM | POA: Diagnosis not present

## 2019-05-13 DIAGNOSIS — M79672 Pain in left foot: Secondary | ICD-10-CM | POA: Diagnosis not present

## 2019-05-13 DIAGNOSIS — E1140 Type 2 diabetes mellitus with diabetic neuropathy, unspecified: Secondary | ICD-10-CM | POA: Diagnosis not present

## 2019-05-14 ENCOUNTER — Encounter: Payer: Self-pay | Admitting: Podiatry

## 2019-05-14 NOTE — Progress Notes (Signed)
  Subjective:  Patient ID: Julie Tran, female    DOB: [birth year],  MRN: [id number]  Chief Complaint  Patient presents with  . Foot Pain    follow up foot pain.  Discuss MRI results   69 y.o. female  presents with the above complaint.  Patient is here for bilateral follow-up of tarsal tunnel syndrome.  Patient states that it is still hurting her.  It gave her temporary relief with a nerve block but it is still causing her a lot of pain.  She states the pain is burning in nature without any resolving factors.  She denies any treatment options that has worked for her.  She states her last A1c was 9.2.  She states that she is working on bringing this number down.  I explained to her that if we are to perform elective surgery I would like her A1c to be at least below 8.  She understands that she is working hard to get the A1c below 8.  She would like to know if there is anything else that could be done for this bilateral tarsal tunnel.  Patient is here to discuss the MRI of the left heel.  Patient would like to go over the results and see if there is any treatment options available for this.  Objective:  There were no vitals filed for this visit. Podiatric Exam: Vascular: dorsalis pedis and posterior tibial pulses are palpable bilateral. Capillary return is immediate. Temperature gradient is WNL. Skin turgor WNL  Sensorium: Diminished protective sensation noted to distal digits bilaterally.  Positive Tinel's sign noted at the tarsal tunnel upon percussion bilaterally extending across the entire plantar surface of the foot. Nail Exam: Pt has thick disfigured discolored nails with subungual debris noted bilateral entire nail hallux through fifth toenails Ulcer Exam: There is no evidence of ulcer or pre-ulcerative changes or infection. Orthopedic Exam: Muscle tone and strength are WNL. No limitations in general ROM. No crepitus or effusions noted. HAV  B/L.  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 Skin: No Porokeratosis.  No infection or ulcers  Assessment & Plan:  Patient was evaluated and treated and all questions answered.  Bilateral tarsal tunnel syndrome(left greater than right) -MRI was reviewed with the patient in extensive detail.  All my findings were discussed with the patient.  It appears that the MRI failed to show any kind of impingement that directly correlated with the tarsal tunnel such as soft tissue mass varicosities.  However, given that she is there is a strong suspicion for tarsal tunnel impingement/involvement.  I believe patient will still benefit from surgical intervention for decompression of the tarsal tunnel region.  However given that her A1c is unknown/greater than 8, I have explained to her thoroughly that I would like her A1c to be much lower ideally below 7 4 m consider doing a surgical intervention/elective procedure.  Patient states she understands. -In the meantime I would like her to wear her plantar fascial braces to bilateral lower extremity which will give her support when ambulating.  Patient states that she will do that. -Plantar fascial braces x2 were dispensed -Patient is also scheduled to see another primary care physician on March 1 part patient.  I will see the patient back afterwards for complete assessment.  If her sugars are well controlled I will consider doing a surgical intervention at that time.   Nicholes Rough, DPM    Return in about 8 weeks (around 07/08/2019) for both feet.

## 2019-05-18 ENCOUNTER — Telehealth: Payer: Self-pay | Admitting: Nurse Practitioner

## 2019-05-18 NOTE — Telephone Encounter (Signed)
error 

## 2019-05-18 NOTE — Telephone Encounter (Signed)
While checking out the pt expressed concerns about [employer] telling her that she could not see Jolene as her provider.

## 2019-05-19 NOTE — Telephone Encounter (Signed)
Contacted patient on 05/04/19 regarding [employer] requiring patient to change PCP and referral to [facility].

## 2019-05-21 ENCOUNTER — Telehealth: Payer: Self-pay

## 2019-05-26 ENCOUNTER — Ambulatory Visit: Payer: Medicare HMO | Admitting: Orthotics

## 2019-05-26 ENCOUNTER — Other Ambulatory Visit: Payer: Self-pay

## 2019-05-26 DIAGNOSIS — E6609 Other obesity due to excess calories: Secondary | ICD-10-CM

## 2019-05-26 DIAGNOSIS — E1140 Type 2 diabetes mellitus with diabetic neuropathy, unspecified: Secondary | ICD-10-CM

## 2019-05-26 DIAGNOSIS — I739 Peripheral vascular disease, unspecified: Secondary | ICD-10-CM

## 2019-05-26 DIAGNOSIS — Z6830 Body mass index (BMI) 30.0-30.9, adult: Secondary | ICD-10-CM

## 2019-05-26 DIAGNOSIS — I152 Hypertension secondary to endocrine disorders: Secondary | ICD-10-CM

## 2019-05-26 DIAGNOSIS — E1159 Type 2 diabetes mellitus with other circulatory complications: Secondary | ICD-10-CM

## 2019-05-26 DIAGNOSIS — G5752 Tarsal tunnel syndrome, left lower limb: Secondary | ICD-10-CM

## 2019-05-26 DIAGNOSIS — G5751 Tarsal tunnel syndrome, right lower limb: Secondary | ICD-10-CM

## 2019-05-26 NOTE — Progress Notes (Signed)

## 2019-05-30 DIAGNOSIS — R262 Difficulty in walking, not elsewhere classified: Secondary | ICD-10-CM | POA: Diagnosis not present

## 2019-05-30 DIAGNOSIS — M6281 Muscle weakness (generalized): Secondary | ICD-10-CM | POA: Diagnosis not present

## 2019-05-30 DIAGNOSIS — R2689 Other abnormalities of gait and mobility: Secondary | ICD-10-CM | POA: Diagnosis not present

## 2019-06-09 ENCOUNTER — Ambulatory Visit: Payer: Self-pay | Admitting: Pharmacist

## 2019-06-09 NOTE — Chronic Care Management (AMB) (Signed)
  Chronic Care Management   Note  06/09/2019 Name: Mckinleigh Schuchart MRN: [id number] DOB: 07-02-1950  Abigale Dorow is a 69 y.o. year old female who is a primary care patient of [place], Timoteo Expose, MD. The CCM team was consulted for assistance with chronic disease management and care coordination needs.    Contacted patient as scheduled. Discussed that she is changing to a new primary care provider. Closed CCM goals.   Catie Feliz Beam, PharmD, [facility] Clinical Pharmacist [facility] [phone]

## 2019-06-17 ENCOUNTER — Ambulatory Visit: Payer: Medicare HMO | Admitting: Podiatry

## 2019-06-25 ENCOUNTER — Ambulatory Visit: Payer: Medicare HMO | Admitting: Nurse Practitioner

## 2019-07-02 ENCOUNTER — Other Ambulatory Visit: Payer: Self-pay

## 2019-07-05 ENCOUNTER — Ambulatory Visit (INDEPENDENT_AMBULATORY_CARE_PROVIDER_SITE_OTHER): Payer: Medicare HMO | Admitting: Family Medicine

## 2019-07-05 ENCOUNTER — Other Ambulatory Visit: Payer: Self-pay

## 2019-07-05 ENCOUNTER — Encounter: Payer: Self-pay | Admitting: Family Medicine

## 2019-07-05 VITALS — BP 128/70 | HR 90 | Temp 95.1°F | Resp 12 | Ht 63.0 in | Wt 180.0 lb

## 2019-07-05 DIAGNOSIS — E785 Hyperlipidemia, unspecified: Secondary | ICD-10-CM | POA: Diagnosis not present

## 2019-07-05 DIAGNOSIS — I7101 Dissection of thoracic aorta: Secondary | ICD-10-CM

## 2019-07-05 DIAGNOSIS — I10 Essential (primary) hypertension: Secondary | ICD-10-CM | POA: Diagnosis not present

## 2019-07-05 DIAGNOSIS — I712 Thoracic aortic aneurysm, without rupture: Secondary | ICD-10-CM

## 2019-07-05 DIAGNOSIS — E1140 Type 2 diabetes mellitus with diabetic neuropathy, unspecified: Secondary | ICD-10-CM | POA: Diagnosis not present

## 2019-07-05 DIAGNOSIS — I152 Hypertension secondary to endocrine disorders: Secondary | ICD-10-CM

## 2019-07-05 DIAGNOSIS — I71012 Dissection of descending thoracic aorta: Secondary | ICD-10-CM

## 2019-07-05 DIAGNOSIS — E876 Hypokalemia: Secondary | ICD-10-CM

## 2019-07-05 DIAGNOSIS — E1169 Type 2 diabetes mellitus with other specified complication: Secondary | ICD-10-CM

## 2019-07-05 DIAGNOSIS — E1159 Type 2 diabetes mellitus with other circulatory complications: Secondary | ICD-10-CM | POA: Diagnosis not present

## 2019-07-05 LAB — MICROALBUMIN / CREATININE URINE RATIO
Creatinine,U: 104.3 mg/dL
Microalb Creat Ratio: 6.5 mg/g (ref 0.0–30.0)
Microalb, Ur: 6.8 mg/dL — ABNORMAL HIGH (ref 0.0–1.9)

## 2019-07-05 LAB — COMPREHENSIVE METABOLIC PANEL
ALT: 13 U/L (ref 0–35)
AST: 15 U/L (ref 0–37)
Albumin: 4.1 g/dL (ref 3.5–5.2)
Alkaline Phosphatase: 73 U/L (ref 39–117)
BUN: 11 mg/dL (ref 6–23)
CO2: 26 mEq/L (ref 19–32)
Calcium: 10 mg/dL (ref 8.4–10.5)
Chloride: 96 mEq/L (ref 96–112)
Creatinine, Ser: 0.62 mg/dL (ref 0.40–1.20)
GFR: 115.17 mL/min (ref >60.00)
Glucose, Bld: 197 mg/dL — ABNORMAL HIGH (ref 70–99)
Potassium: 3.6 mEq/L (ref 3.5–5.1)
Sodium: 138 mEq/L (ref 135–145)
Total Bilirubin: 0.5 mg/dL (ref 0.2–1.2)
Total Protein: 7.1 g/dL (ref 6.0–8.3)

## 2019-07-05 LAB — HEMOGLOBIN A1C: Hgb A1c MFr Bld: 9.5 % — ABNORMAL HIGH (ref 4.6–6.5)

## 2019-07-05 LAB — MAGNESIUM: Magnesium: 1.5 mg/dL (ref 1.5–2.5)

## 2019-07-05 NOTE — Patient Instructions (Addendum)
A few things to remember from today's visit:   Dissecting aneurysm of thoracic aorta, Stanford type B (HCC), Chronic  Type 2 diabetes mellitus with diabetic neuropathy, without long-term current use of insulin (HCC) - Plan: Ambulatory referral to Endocrinology, Microalbumin / creatinine urine ratio, Comprehensive metabolic panel, Fructosamine, Hemoglobin A1c  Hypertension associated with diabetes (HCC) - Plan: Comprehensive metabolic panel  Hypokalemia - Plan: Comprehensive metabolic panel  Hypomagnesemia - Plan: Magnesium  No changes today.   Please be sure medication list is accurate. If a new problem present, please set up appointment sooner than planned today.
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
## 2019-07-07 ENCOUNTER — Other Ambulatory Visit: Payer: Self-pay

## 2019-07-07 MED ORDER — MAGNESIUM OXIDE 400 MG PO TABS: 600.0000 mg | ORAL_TABLET | Freq: Every day | ORAL | Status: AC

## 2019-07-08 ENCOUNTER — Other Ambulatory Visit: Payer: Self-pay

## 2019-07-08 ENCOUNTER — Ambulatory Visit: Payer: Medicare HMO | Admitting: Podiatry

## 2019-07-08 DIAGNOSIS — E1140 Type 2 diabetes mellitus with diabetic neuropathy, unspecified: Secondary | ICD-10-CM

## 2019-07-08 DIAGNOSIS — M2041 Other hammer toe(s) (acquired), right foot: Secondary | ICD-10-CM | POA: Diagnosis not present

## 2019-07-08 DIAGNOSIS — R200 Anesthesia of skin: Secondary | ICD-10-CM

## 2019-07-08 DIAGNOSIS — M79675 Pain in left toe(s): Secondary | ICD-10-CM | POA: Diagnosis not present

## 2019-07-08 DIAGNOSIS — M2012 Hallux valgus (acquired), left foot: Secondary | ICD-10-CM | POA: Diagnosis not present

## 2019-07-08 DIAGNOSIS — B351 Tinea unguium: Secondary | ICD-10-CM | POA: Diagnosis not present

## 2019-07-08 DIAGNOSIS — M79674 Pain in right toe(s): Secondary | ICD-10-CM

## 2019-07-08 DIAGNOSIS — R202 Paresthesia of skin: Secondary | ICD-10-CM | POA: Diagnosis not present

## 2019-07-08 DIAGNOSIS — M2011 Hallux valgus (acquired), right foot: Secondary | ICD-10-CM | POA: Diagnosis not present

## 2019-07-08 DIAGNOSIS — M2042 Other hammer toe(s) (acquired), left foot: Secondary | ICD-10-CM | POA: Diagnosis not present

## 2019-07-08 LAB — FRUCTOSAMINE: Fructosamine: 398 umol/L — ABNORMAL HIGH (ref 205–285)

## 2019-07-08 MED ORDER — PREGABALIN 75 MG PO CAPS: 75.0000 mg | ORAL_CAPSULE | Freq: Two times a day (BID) | ORAL | 1 refills | Status: AC

## 2019-07-09 ENCOUNTER — Encounter: Payer: Self-pay | Admitting: Podiatry

## 2019-07-09 NOTE — Progress Notes (Signed)
Subjective:  Patient ID: Julie Tran, female    DOB: [birth year],  MRN: [id number]  Chief Complaint  Patient presents with  . Foot Pain    pt is here for a f/u on tarsal tunnel syndrome, pt states that she is still feeling cold and numbness in her feet, pt also states that the plantar fascail braces are helping as well.   69 y.o. female  presents with the above complaint.  Patient is here for bilateral follow-up of tarsal tunnel syndrome.  Patient states that it is still hurting her.  It gave her temporary relief with a nerve block but it is still causing her a lot of pain.  She states the pain is burning in nature without any resolving factors.  She denies any treatment options that has worked for her.  She states her last A1c was 9.2.  She states that she is working on bringing this number down.  I explained to her that if we are to perform elective surgery I would like her A1c to be at least below 8.  She understands that she is working hard to get the A1c below 8.  She would like to know if there is anything else that could be done for this bilateral tarsal tunnel.  Patient is here to discuss the MRI of the left heel.  Patient would like to go over the results and see if there is any treatment options available for this.  Objective:  There were no vitals filed for this visit. Podiatric Exam: Vascular: dorsalis pedis and posterior tibial pulses are palpable bilateral. Capillary return is immediate. Temperature gradient is WNL. Skin turgor WNL  Sensorium: Diminished protective sensation noted to distal digits bilaterally.  Positive Tinel's sign noted at the tarsal tunnel upon percussion bilaterally extending across the entire plantar surface of the foot. Nail Exam: Pt has thick disfigured discolored nails with subungual debris noted bilateral entire nail hallux through fifth toenails Ulcer Exam: There is no evidence of ulcer or pre-ulcerative changes or infection. Orthopedic Exam: Muscle tone  and strength are WNL. No limitations in general ROM. No crepitus or effusions noted. HAV  B/L.  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 Skin: No Porokeratosis. No infection or ulcers  Assessment & Plan:  Patient was evaluated and treated and all questions answered.  Bilateral tarsal tunnel syndrome(left greater than right) -MRI was reviewed with the patient in extensive detail.  All my findings were discussed with the patient.  It appears that the MRI failed to show any kind of impingement that directly correlated with the tarsal tunnel such as soft tissue mass varicosities.  However, given that she is there is a strong suspicion for tarsal tunnel impingement/involvement.  I believe patient will still benefit from surgical intervention for decompression of the tarsal tunnel region.  However given that her A1c is unknown/greater than 8, I have explained to her thoroughly that I would like her A1c to be much lower ideally below 7 4 m consider doing a surgical intervention/elective procedure.  Patient states she understands. -In the meantime I would like her to wear her plantar fascial braces to bilateral lower extremity which will give her support when ambulating.  Patient states that she will do that. -Plantar fascial brace has been helping. -Patient is scheduled to see an endocrinologist over the next month to evaluate the blood sugar and maintain them.  Her sugars are still not well controlled.  Will defer surgical management at this time.  -Diabetic shoes were dispensed.  The shoes fitted well with the patient.  She was able to ambulate without any antalgic gait.  Onychomycosis with pain x10  Procedure: Nail Debridement Rationale: pain  Type of Debridement: manual, sharp debridement. Instrumentation: Nail nipper, rotary burr. Number of Nails: 10  Procedures and Treatment: Consent by patient was obtained for treatment procedures. The patient understood the discussion of treatment and procedures well. All  questions were answered thoroughly reviewed. Debridement of mycotic and hypertrophic toenails, 1 through 5 bilateral and clearing of subungual debris. No ulceration, no infection noted.  Return Visit-Office Procedure: Patient instructed to return to the office for a follow up visit 3 months for continued evaluation and treatment.  Nicholes Rough, DPM    No follow-ups on file.    Nicholes Rough, DPM    No follow-ups on file.

## 2019-07-23 ENCOUNTER — Other Ambulatory Visit: Payer: Self-pay

## 2019-07-27 ENCOUNTER — Ambulatory Visit (INDEPENDENT_AMBULATORY_CARE_PROVIDER_SITE_OTHER): Payer: Medicare HMO | Admitting: Internal Medicine

## 2019-07-27 ENCOUNTER — Other Ambulatory Visit: Payer: Self-pay

## 2019-07-27 ENCOUNTER — Encounter: Payer: Self-pay | Admitting: Internal Medicine

## 2019-07-27 VITALS — BP 128/78 | HR 82 | Temp 98.6°F | Ht 63.0 in | Wt 176.6 lb

## 2019-07-27 DIAGNOSIS — E1165 Type 2 diabetes mellitus with hyperglycemia: Secondary | ICD-10-CM | POA: Diagnosis not present

## 2019-07-27 DIAGNOSIS — E1140 Type 2 diabetes mellitus with diabetic neuropathy, unspecified: Secondary | ICD-10-CM

## 2019-07-27 LAB — GLUCOSE, POCT (MANUAL RESULT ENTRY): POC Glucose: 358 mg/dl — AB (ref 70–99)

## 2019-07-27 NOTE — Progress Notes (Signed)
Name: Julie Tran  MRN/ DOB: [id number], 01/13/1951   Age/ Sex: 69 y.o., female    PCP: [place], Betty G, MD   Reason for Endocrinology Evaluation: Type 2 Diabetes Mellitus     Date of Initial Endocrinology Visit: 07/27/2019     PATIENT IDENTIFIER: Julie Tran is a 69 y.o. female with a past medical history of T2DM, HTN, peripheral artery disease, and asthma. The patient presented for initial endocrinology clinic visit on 07/27/2019 for consultative assistance with her diabetes management.    HPI: Julie Tran was    Diagnosed with DM in 2009 Prior Medications tried/Intolerance: Jardiance- hematuria . Glipizide, Actos  Currently checking blood sugars 3-4 x / day Hypoglycemia episodes : no                 Hemoglobin A1c has ranged from 8.8% in 12/2018, peaking at 12.9% in 10/2018. Patient required assistance for hypoglycemia: no  Patient has required hospitalization within the last 1 year from hyper or hypoglycemia: no  In terms of diet, the patient eats breakfast and dinner, snacks for lunch. Avoids sugar-sweetened beverages.   Used to see endocrinology in 2017 while living in [place] , [place]  HOME DIABETES REGIMEN: Metformin 1000 mg twice daily    Statin: No ACE-I/ARB: No Prior Diabetic Education:Yes   METER DOWNLOAD SUMMARY: Did not bring    DIABETIC COMPLICATIONS: Microvascular complications:   Neuropathy  Denies: retinopathy , CKD   Last eye exam: Completed 03/2019  Macrovascular complications:    Denies: CAD, PVD, CVA   PAST HISTORY: Past Medical History:  Past Medical History:  Diagnosis Date  . Acute blood loss anemia 11/06/2017  . Acute delirium   . Acute post-operative pain 11/03/2017  . AKI (acute kidney injury) (HCC)   . Aneurysm of thoracic aorta (HCC) 10/30/2017  . Asthma   . Decreased activities of daily living (ADL) 11/06/2017  . Decreased strength, endurance, and mobility 11/04/2017  . Diabetes mellitus without complication (HCC)   .  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 DKA (diabetic ketoacidoses) (HCC) 10/04/2018  . Goiter 07/31/2012   U/s Jan 2014 with bilateral small cysts stable in size from December. U/s Jan 2014 with bilateral small cysts stable in size from December.  Marland Kitchen HHNC (hyperglycemic hyperosmolar nonketotic coma) (HCC)   . 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 HTN (hypertension)   . Hyperlipidemia, unspecified 07/31/2012  . Insomnia 11/04/2017  . LVH (left ventricular hypertrophy) 11/06/2017  . Neuropathy   . OAB (overactive bladder) 01/20/2018  . PAD (peripheral artery disease) (HCC)   . Pleural effusion on left 11/01/2017  . Urinary retention 01/20/2018   Past Surgical History:  Past Surgical History:  Procedure Laterality Date  . ABDOMINAL AORTIC ANEURYSM REPAIR    . CARDIAC SURGERY    . Eye Surgery    . PR OPEN TREATMENT BIMALLEOLAR ANKLE FRACTURE    . SHOULDER ARTHROSCOPY    . THROMBOENDARTERECTOMY FEMORAL ARTERY        Social History:  reports that she has never smoked. She has never used smokeless tobacco. She reports that she does not drink alcohol or use drugs. Family History:  Family History  Problem Relation Age of Onset  . Diabetes Sister   . Stroke Sister   . Hypertension Sister      HOME MEDICATIONS: Allergies as of 07/27/2019      Reactions   Ace  Inhibitors Hives, Swelling   Had angioedema with lisinopril. Se[facility] ED for this.  Had angioedema with lisinopril. Seen in [facility] ED for this.    Contrast Media [iodinated Diagnostic Agents]    Iodine Swelling   Latex Hives, Swelling   Penicillins Hives   Salicylates Hives   Shellfish Allergy Swelling      Medication List       Accurate as of July 27, 2019  4:08 PM. If you have any questions, ask your nurse or doctor.        amLODipine 5 MG tablet Commonly known as: NORVASC Take 1 tablet (5 mg total) by mouth daily.   b  complex vitamins tablet Take 1 tablet by mouth daily.   CALCIUM MAGNESIUM PO Take by mouth. 2 tablets in morning and 1 tablet in evening   carvedilol 6.25 MG tablet Commonly known as: COREG Take 1 tablet (6.2 mg total) by mouth every 12 (twelve) hours.   gabapentin 300 MG capsule Commonly known as: NEURONTIN Take 1 capsule (300 mg total) by mouth 2 (two) times daily.   magnesium oxide 400 MG tablet Commonly known as: MAG-OX Take 1.5 tablets (600 mg total) by mouth daily.   metFORMIN 500 MG tablet Commonly known as: GLUCOPHAGE Take 2 tablets (1,000 mg total) by mouth 2 (two) times daily with a meal.   Potassium Chloride ER 20 MEQ Tbcr Take 20 mEq by mouth daily.   potassium chloride SA 20 MEQ tablet Commonly known as: KLOR-CON Take by mouth.   pregabalin 75 MG capsule Commonly known as: Lyrica Take 1 capsule (75 mg total) by mouth 2 (two) times daily.   PROBIOTIC DAILY PO Take 1 tablet by mouth daily.   TURMERIC PO Take 1 tablet by mouth daily.        ALLERGIES: Allergies  Allergen Reactions  . Ace Inhibitors Hives and Swelling    Had angioedema with lisinopril. Seen in [facility] ED for this.  Had angioedema with lisinopril. Seen in [facility] ED for this.    . Contrast Media [Iodinated Diagnostic Agents]   . Iodine Swelling  . Latex Hives and Swelling  . Penicillins Hives  . Salicylates Hives  . Shellfish Allergy Swelling     REVIEW OF SYSTEMS: A comprehensive ROS was conducted with the patient and is negative except as per HPI and below:  ROS    OBJECTIVE:   VITAL SIGNS: BP 128/78 (BP Location: Left Arm, Patient Position: Sitting, Cuff Size: Large)   Pulse 82   Temp 98.6 F (37 C)   Ht 5\' 3"  (1.6 m)   Wt 176 lb 9.6 oz (80.1 kg)   SpO2 98%   BMI 31.28 kg/m    PHYSICAL EXAM:  General: Pt appears well and is in NAD  Lungs: Clear with good BS bilat with no rales, rhonchi, or wheezes  Heart: RRR  Extremities:  Lower extremities - No pretibial  edema.   Skin: Normal texture and temperature to palpation.   Neuro: MS is good with appropriate affect, pt is alert and Ox3    DM foot exam: 07/27/2019  The skin of the feet is intact without sores or ulcerations. The pedal pulses are 2+ on right and 2+ on left. The sensation is intact to a screening 5.07, 10 gram monofilament bilaterally   DATA REVIEWED:  Lab Results  Component Value Date   HGBA1C 9.5 (H) 07/05/2019   HGBA1C 9.2 (H) 03/31/2019   HGBA1C 8.8 (H) 12/29/2018   Lab Results  Component Value Date   MICROALBUR 6.8 (H) 07/05/2019   LDLCALC 78 12/29/2018   CREATININE 0.62 07/05/2019   Lab Results  Component Value Date   MICRALBCREAT 6.5 07/05/2019    Lab Results  Component Value Date   CHOL 156 12/29/2018   HDL 59 12/29/2018   LDLCALC 78 12/29/2018   TRIG 95 12/29/2018        ASSESSMENT / PLAN / RECOMMENDATIONS:   1) Type 2 Diabetes Mellitus, Poorly controlled, With Neuropathic complications - Most recent A1c of 9.5 %. Goal A1c < 7.0 %.    -Poorly controlled diabetes due to suboptimal medical management, and lack of exercise.  Patient was recently diagnosed with tarsal tunnel syndrome, she is complaining of a lot of discomfort in her feet. -We discussed the importance of improving glycemic control to slow down or stop the progression of neuropathy, we did discuss add-on therapy with glipizide and GLP-1 agonist.  We did discuss the risk and the benefits of both classes.  Patient opted to improve her diet and work on trying to exercise. -We did discuss the importance of bringing her glucose to an optimal level rapidly, and in the meantime she could continue with lifestyle changes.  But the patient declines any add-on therapy at this point and would like to try diet and exercise. -She is intolerant to SGLT-2 inhibitors due to hematuria. -I have offered to refer her to our RD but the patient stated she has the information.   MEDICATIONS:  Continue metformin 1000  mg twice daily  EDUCATION / INSTRUCTIONS:  BG monitoring instructions: Patient is instructed to check her blood sugars 2 times a day, fasting and during the day.  Call [facility] if: BG persistently < 70 or > 300. . I reviewed the Rule of 15 for the treatment of hypoglycemia in detail with the patient. Literature supplied.   2) Diabetic complications:   Eye: Does not have known diabetic retinopathy.   Neuro/ Feet: Does have known diabetic peripheral neuropathy.  Renal: Patient does not have known baseline CKD. She is not on an ACEI/ARB at present.     3) Lipids: Patient is not on a statin.    Follow-up in 3 months     Signed electronically by: Lyndle Herrlich, MD  [facility]  [facility] [address] [place] [place], [place] [address] Phone: [phone] FAX: [phone]   CC: [place], Betty G, MD [address] [place] [address] Phone: [phone]  Fax: [phone]    Return to Endocrinology clinic as below: Future Appointments  Date Time Provider Department Center  09/02/2019  2:45 PM Candelaria Stagers, DPM [facility]  10/19/2019  3:00 PM Macel Yearsley, Konrad Dolores, MD [id number] None  01/05/2020 12:00 PM [place], Betty G, MD [id number]

## 2019-07-27 NOTE — Patient Instructions (Signed)
-   Continue Metformin  1000 mg twice daily    - Exercise   150 minutes a week

## 2019-09-02 ENCOUNTER — Ambulatory Visit: Payer: Medicare HMO | Admitting: Podiatry

## 2019-10-19 ENCOUNTER — Ambulatory Visit: Payer: Medicare HMO | Admitting: Internal Medicine

## 2019-10-29 ENCOUNTER — Other Ambulatory Visit: Payer: Self-pay | Admitting: *Deleted

## 2019-10-29 ENCOUNTER — Telehealth: Payer: Self-pay | Admitting: Family Medicine

## 2019-10-29 ENCOUNTER — Telehealth: Payer: Self-pay | Admitting: *Deleted

## 2019-10-29 MED ORDER — METFORMIN HCL 500 MG PO TABS: 1000.0000 mg | ORAL_TABLET | Freq: Two times a day (BID) | ORAL | 3 refills | Status: AC

## 2019-10-29 MED ORDER — GABAPENTIN 300 MG PO CAPS: 300.0000 mg | ORAL_CAPSULE | Freq: Two times a day (BID) | ORAL | 3 refills | Status: AC

## 2019-10-29 NOTE — Telephone Encounter (Signed)
Returned call to patient and she stated that she did not call this office asking for metformin, she stated that she asked for Dr. [place] to send in her Gabapentin. Previous message read to patient and she stated that was not what she called this office about. Rx for Gabapentin sent to pharmacy as requested.

## 2019-10-29 NOTE — Telephone Encounter (Signed)
Copied from CRM #[id number]. Topic: General - Other >> Oct 29, 2019  2:48 PM Dalphine Handing A wrote: Patient is requesting a callback from [facility] nurse as soon as possible today in regards to her metformin medication, Patient feels that she has been taking the wrong dosage of medication and is slightly irate. Please advise   FYI She has a new PCP on her chart.

## 2019-10-29 NOTE — Telephone Encounter (Signed)
Should be taken care by new PCP, she had sent a message to them as well.

## 2019-10-29 NOTE — Telephone Encounter (Signed)
Rx sent to the pharmacy as requested. ?

## 2019-10-29 NOTE — Telephone Encounter (Signed)
Patient is upset that she is having trouble getting the medications she needs- dosing and form. Patient is concerned that she got the wrong dosage of metformin- and has been having trouble with communicating what she needs with the pharmacy. Patient is extremely upset- recommend she get print out from the pharmacy and the office of her records so she can understand the past. Patient would like to be transferred to [facility] to speak to office manager about getting a print out of her records- call to office- per [facility]- office manager is not in office now- will give her message for Monday. Patient made aware.

## 2019-10-29 NOTE — Telephone Encounter (Signed)
Pt is no long a patient at [facility].

## 2019-10-29 NOTE — Telephone Encounter (Signed)
Medication Refill:  [id number] - [place], [place] - [address] AT [facility] OF [address] [facility] Phone:  [phone]  Fax:  [phone]

## 2019-10-29 NOTE — Telephone Encounter (Signed)
Pt stated her medication Metformin is incorrect. She stated that she takes 1000mg  twice daily not 500 mg twice daily and that she has been on that dosage for years. She would like to speak to either PCP/CMA in regards to this.    Pt can be reached at [phone]

## 2019-11-05 NOTE — Telephone Encounter (Signed)
Called pt no answer LVM for patient to call office back.

## 2019-12-24 DIAGNOSIS — R930 Abnormal findings on diagnostic imaging of skull and head, not elsewhere classified: Secondary | ICD-10-CM | POA: Diagnosis not present

## 2019-12-24 DIAGNOSIS — Z95828 Presence of other vascular implants and grafts: Secondary | ICD-10-CM | POA: Diagnosis not present

## 2019-12-24 DIAGNOSIS — I7101 Dissection of thoracic aorta: Secondary | ICD-10-CM | POA: Diagnosis not present

## 2019-12-24 DIAGNOSIS — Z8679 Personal history of other diseases of the circulatory system: Secondary | ICD-10-CM | POA: Diagnosis not present

## 2019-12-24 DIAGNOSIS — I712 Thoracic aortic aneurysm, without rupture: Secondary | ICD-10-CM | POA: Diagnosis not present

## 2019-12-24 DIAGNOSIS — E119 Type 2 diabetes mellitus without complications: Secondary | ICD-10-CM | POA: Diagnosis not present

## 2019-12-24 DIAGNOSIS — Z48812 Encounter for surgical aftercare following surgery on the circulatory system: Secondary | ICD-10-CM | POA: Diagnosis not present

## 2020-01-05 ENCOUNTER — Ambulatory Visit: Payer: Medicare HMO | Admitting: Family Medicine

## 2020-01-10 DIAGNOSIS — R69 Illness, unspecified: Secondary | ICD-10-CM | POA: Diagnosis not present

## 2020-02-03 DIAGNOSIS — E785 Hyperlipidemia, unspecified: Secondary | ICD-10-CM | POA: Diagnosis not present

## 2020-02-03 DIAGNOSIS — E1142 Type 2 diabetes mellitus with diabetic polyneuropathy: Secondary | ICD-10-CM | POA: Diagnosis not present

## 2020-02-03 DIAGNOSIS — I10 Essential (primary) hypertension: Secondary | ICD-10-CM | POA: Diagnosis not present

## 2020-02-03 DIAGNOSIS — Z95828 Presence of other vascular implants and grafts: Secondary | ICD-10-CM | POA: Diagnosis not present

## 2020-02-28 DIAGNOSIS — I10 Essential (primary) hypertension: Secondary | ICD-10-CM | POA: Diagnosis not present

## 2020-02-28 DIAGNOSIS — E1142 Type 2 diabetes mellitus with diabetic polyneuropathy: Secondary | ICD-10-CM | POA: Diagnosis not present

## 2020-03-02 DIAGNOSIS — E1165 Type 2 diabetes mellitus with hyperglycemia: Secondary | ICD-10-CM | POA: Diagnosis not present

## 2020-03-02 DIAGNOSIS — Z95828 Presence of other vascular implants and grafts: Secondary | ICD-10-CM | POA: Diagnosis not present

## 2020-03-02 DIAGNOSIS — Z780 Asymptomatic menopausal state: Secondary | ICD-10-CM | POA: Diagnosis not present

## 2020-03-02 DIAGNOSIS — I10 Essential (primary) hypertension: Secondary | ICD-10-CM | POA: Diagnosis not present

## 2020-03-02 DIAGNOSIS — Z1231 Encounter for screening mammogram for malignant neoplasm of breast: Secondary | ICD-10-CM | POA: Diagnosis not present

## 2020-03-02 DIAGNOSIS — E1121 Type 2 diabetes mellitus with diabetic nephropathy: Secondary | ICD-10-CM | POA: Diagnosis not present

## 2020-03-02 DIAGNOSIS — Z0000 Encounter for general adult medical examination without abnormal findings: Secondary | ICD-10-CM | POA: Diagnosis not present

## 2020-03-02 DIAGNOSIS — R809 Proteinuria, unspecified: Secondary | ICD-10-CM | POA: Diagnosis not present

## 2020-03-02 DIAGNOSIS — E1142 Type 2 diabetes mellitus with diabetic polyneuropathy: Secondary | ICD-10-CM | POA: Diagnosis not present

## 2020-03-02 DIAGNOSIS — I712 Thoracic aortic aneurysm, without rupture: Secondary | ICD-10-CM | POA: Diagnosis not present

## 2020-03-17 DIAGNOSIS — E119 Type 2 diabetes mellitus without complications: Secondary | ICD-10-CM | POA: Diagnosis not present

## 2020-03-17 DIAGNOSIS — Z961 Presence of intraocular lens: Secondary | ICD-10-CM | POA: Diagnosis not present

## 2020-03-28 DIAGNOSIS — Z23 Encounter for immunization: Secondary | ICD-10-CM | POA: Diagnosis not present

## 2020-03-29 ENCOUNTER — Other Ambulatory Visit: Payer: Self-pay | Admitting: Nurse Practitioner

## 2020-03-29 NOTE — Telephone Encounter (Signed)
   Notes to clinic Not a provider we approve rx for, please assess. 

## 2020-04-20 DIAGNOSIS — Z13820 Encounter for screening for osteoporosis: Secondary | ICD-10-CM | POA: Diagnosis not present

## 2020-04-20 DIAGNOSIS — Z780 Asymptomatic menopausal state: Secondary | ICD-10-CM | POA: Diagnosis not present

## 2020-04-20 DIAGNOSIS — Z1231 Encounter for screening mammogram for malignant neoplasm of breast: Secondary | ICD-10-CM | POA: Diagnosis not present

## 2020-08-24 ENCOUNTER — Telehealth: Payer: Self-pay

## 2020-08-24 NOTE — Telephone Encounter (Signed)
Tried calling patient, no answer and no VM. Reviewing reports for this office and upon chart review, patient is no longer being seen here at [facility]. Tried calling to confirm this and discuss calling insurance to check with them. Will try to call patient again later.

## 2021-01-26 ENCOUNTER — Telehealth: Payer: Self-pay | Admitting: *Deleted

## 2021-01-26 NOTE — Telephone Encounter (Signed)
"  I'd like to know if you sent my bill in that I owe of $35.  I'd like you to call me."

## 2021-04-17 IMAGING — US US PELVIS COMPLETE WITH TRANSVAGINAL
1 series · 13 of 25 positions shown · non-contrast
Comparison: CT Abdomen and Pelvis 10/06/2018.

CLINICAL DATA: 68-year-old female with suspected pelvic abscesses
on CT Abdomen and Pelvis yesterday. Pain and dysuria.

EXAM:
TRANSABDOMINAL AND TRANSVAGINAL ULTRASOUND OF PELVIS
TECHNIQUE: Both transabdominal and transvaginal ultrasound examinations of the
pelvis were performed. Transabdominal technique was performed for
global imaging of the pelvis including uterus, ovaries, adnexal
regions, and pelvic cul-de-sac. It was necessary to proceed with
endovaginal exam following the transabdominal exam in an attempt to
visualize the ovaries.

[Series 1: us pelvis complete with transvaginal · 160 acquisitions, 13 frames shown]
[im 1/160]
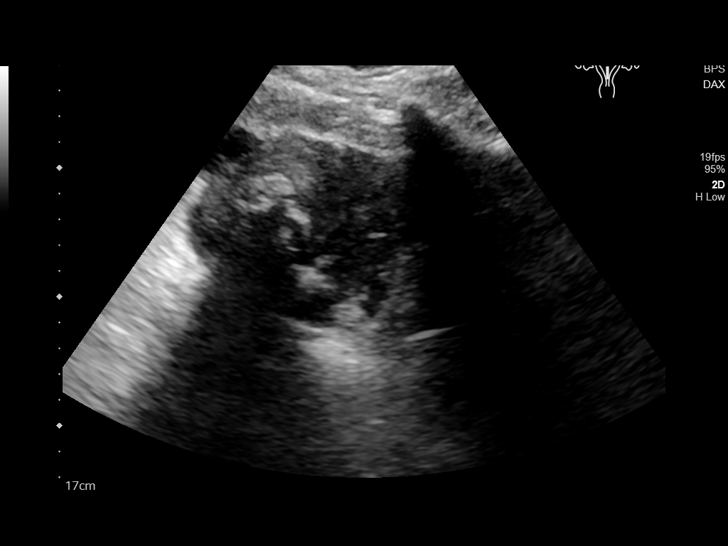
[im 14/160]
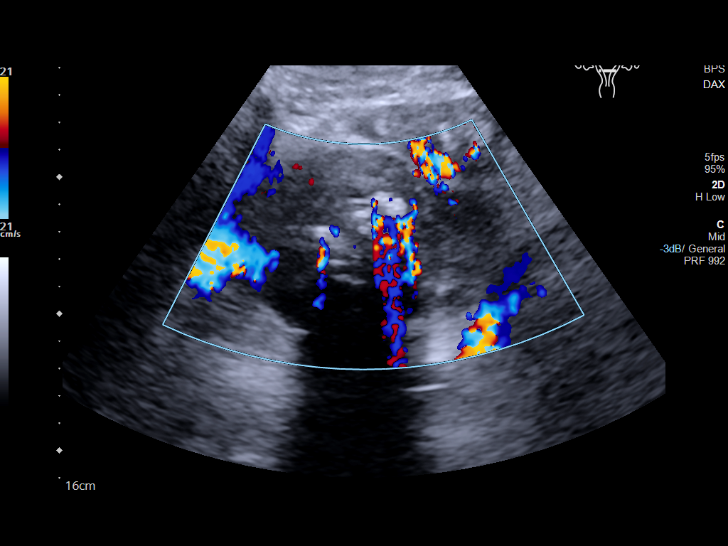
[im 27/160]
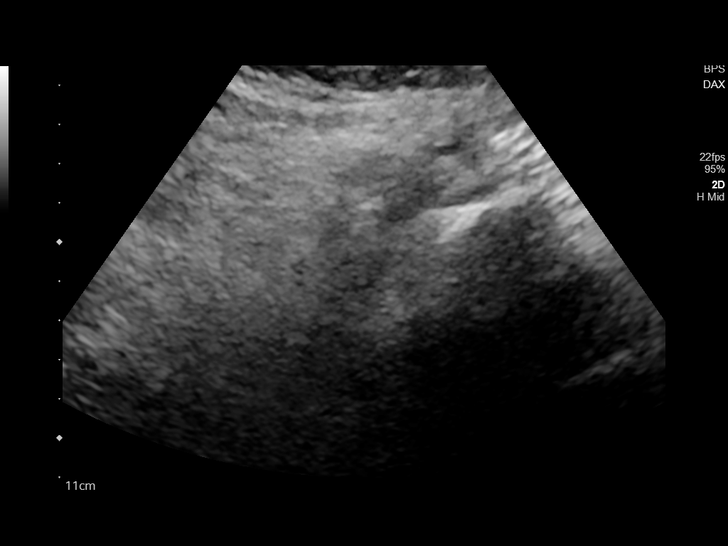
[im 40/160]
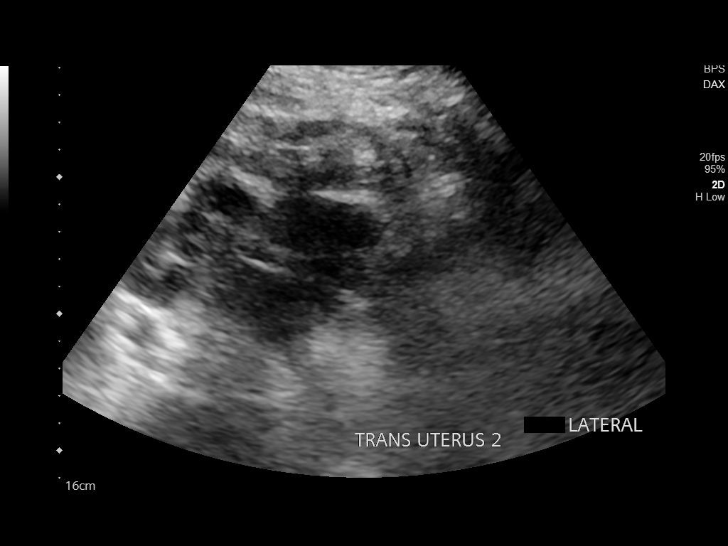
[im 54/160]
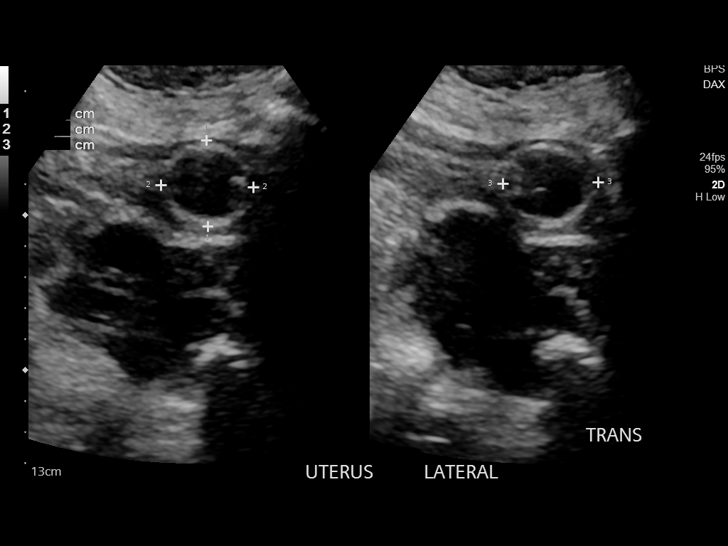
[im 67/160]
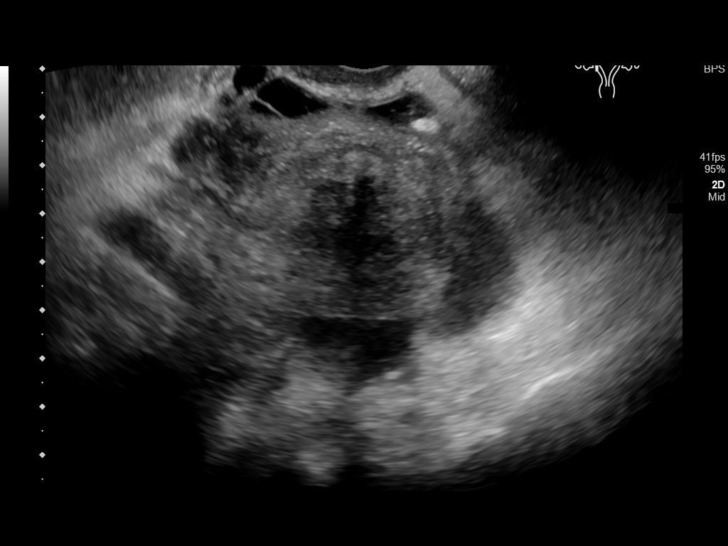
[im 80/160]
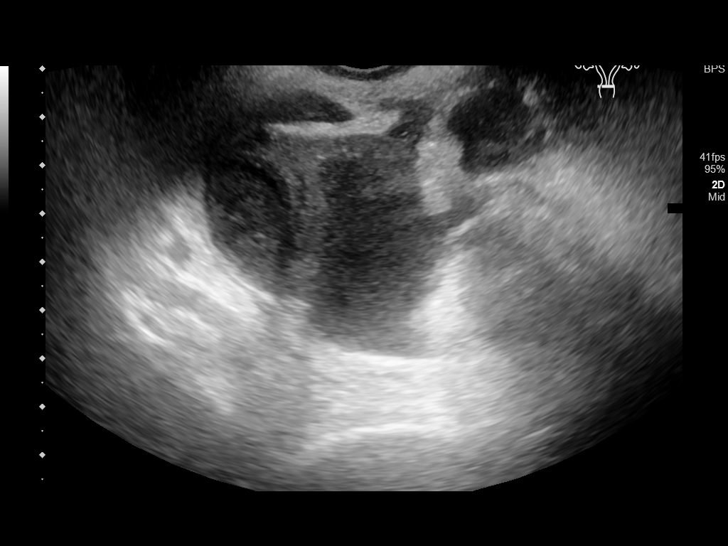
[im 93/160]
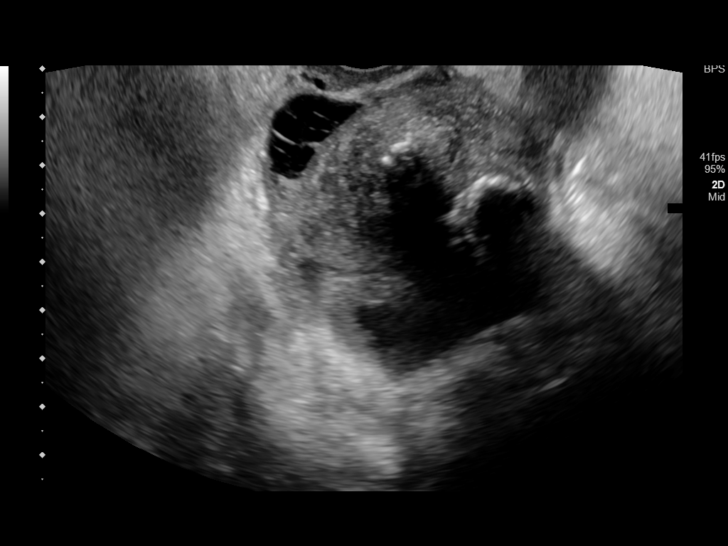
[im 107/160]
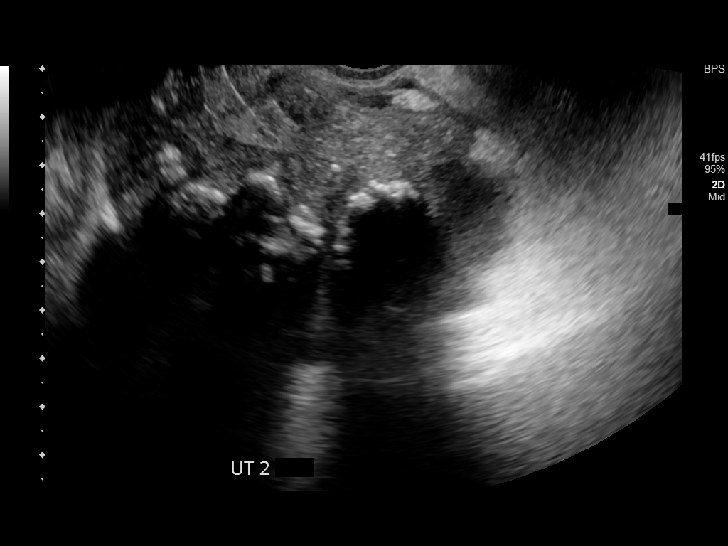
[im 120/160]
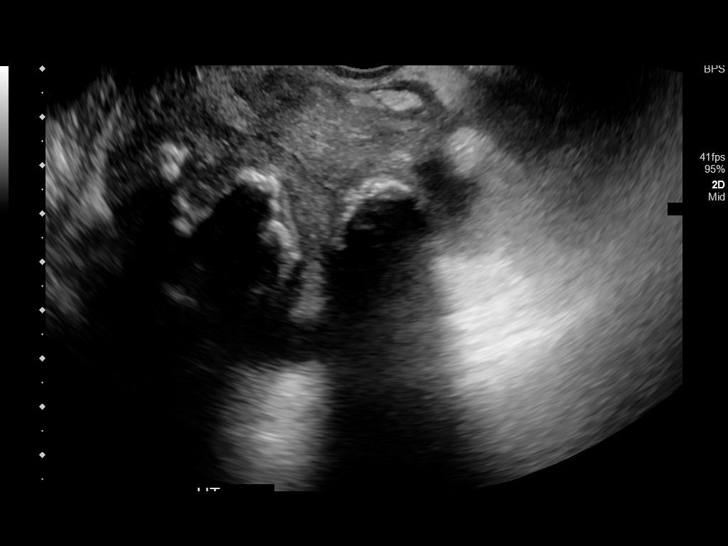
[im 133/160]
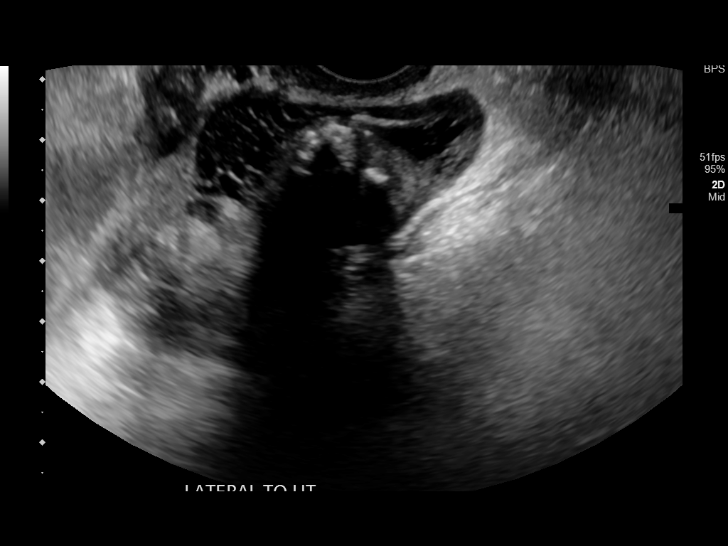
[im 146/160]
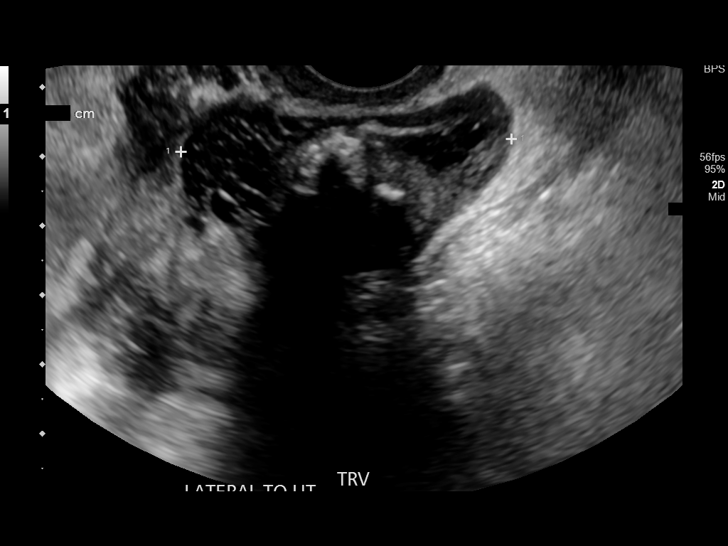
[im 160/160]
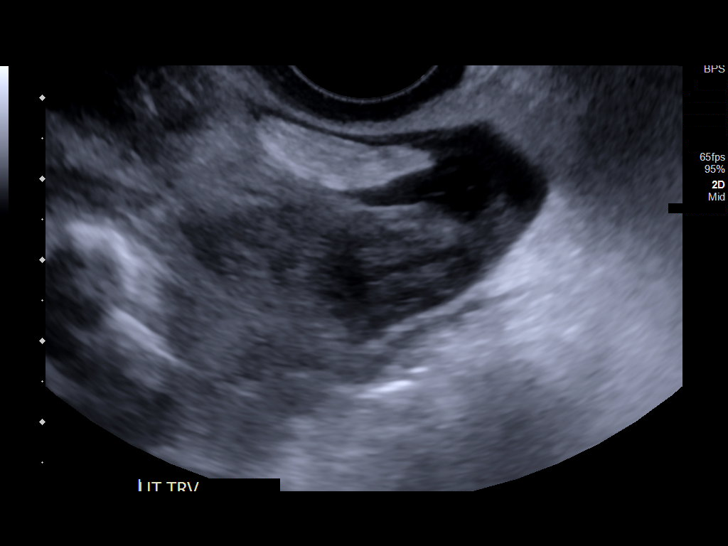

[13 of 25 positions shown; findings below may reference images not displayed]

FINDINGS: Uterus

Measurements: 13.5 x 7.3 x 9.7 = volume: 503 mL. Shadowing calcified
fibroids as seen on the CT ranging from 2.6-3.4 centimeters diameter
(image 38).

Endometrium

Thickness: 8 millimeters. The endometrium appears mildly distorted
by the uterine fibroids.

Right ovary

Measurements: Not identified despite transabdominal and transvaginal
imaging.

Left ovary

Measurements: Not identified despite transabdominal and transvaginal
imaging.

Other findings

Tubular appearing parametrial fluid collections redemonstrated
(images 67 and 92) with mild internal echogenic debris, no vascular
elements. The collections appear mildly septated and loculated in
areas (image 149). It is unclear whether there might be a
superimposed small volume of free pelvic fluid (probably not).

Foley catheter balloon visible within the decompressed urinary
bladder.
IMPRESSION: 1. Multiple tubular parametrial fluid collections re-demonstrated
with mild internal echogenic debris, mild septation/loculation. No
vascular elements identified. These remain suspicious for Abscesses.
Pyosalpinx is a possibility.
2. Neither ovary could be identified.
3. Multiple calcified uterine fibroids again noted. Upper limits of
normal endometrial thickness (8 mm).

## 2021-04-29 IMAGING — CR RIGHT SHOULDER - 2+ VIEW
1 series · 3 of 3 positions shown · non-contrast
Comparison: No recent.

CLINICAL DATA: Fall.

EXAM:
RIGHT SHOULDER - 2+ VIEW

[Series 1: dg shoulder right · 0.14mm/px · 3 of 3 slices shown]
[im 1/3]
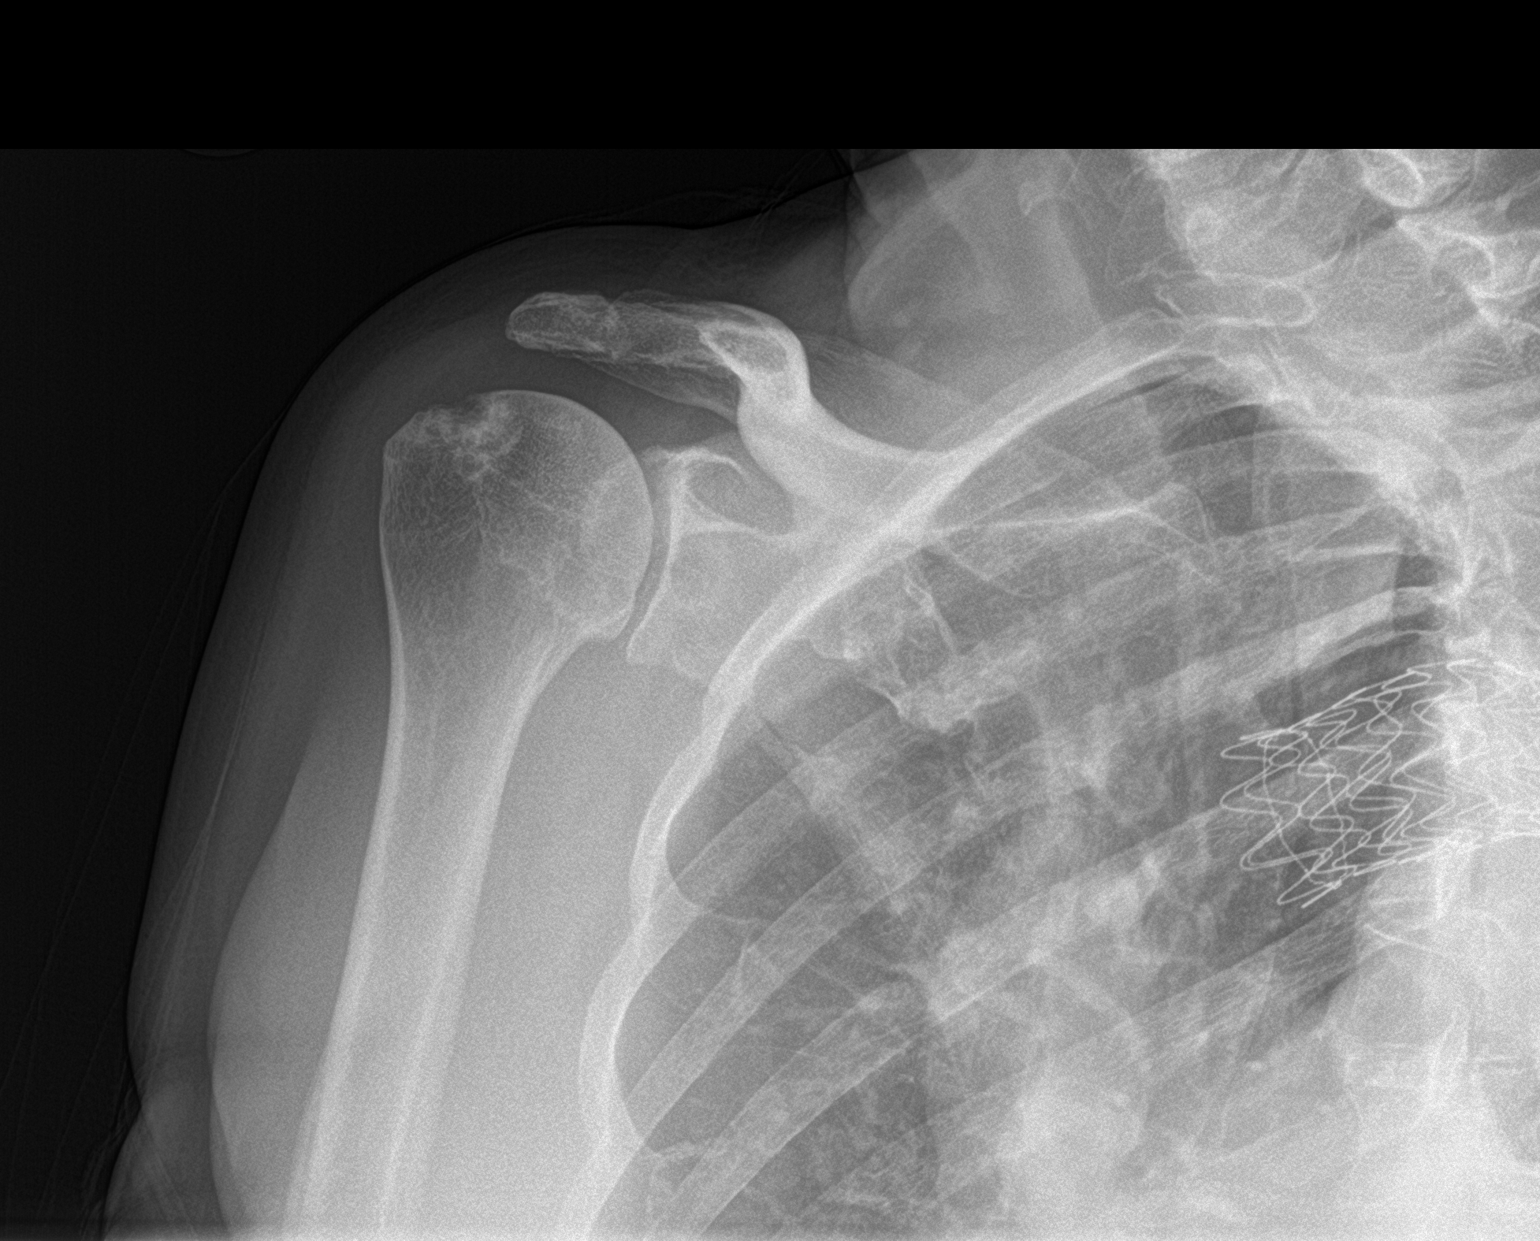
[im 2/3]
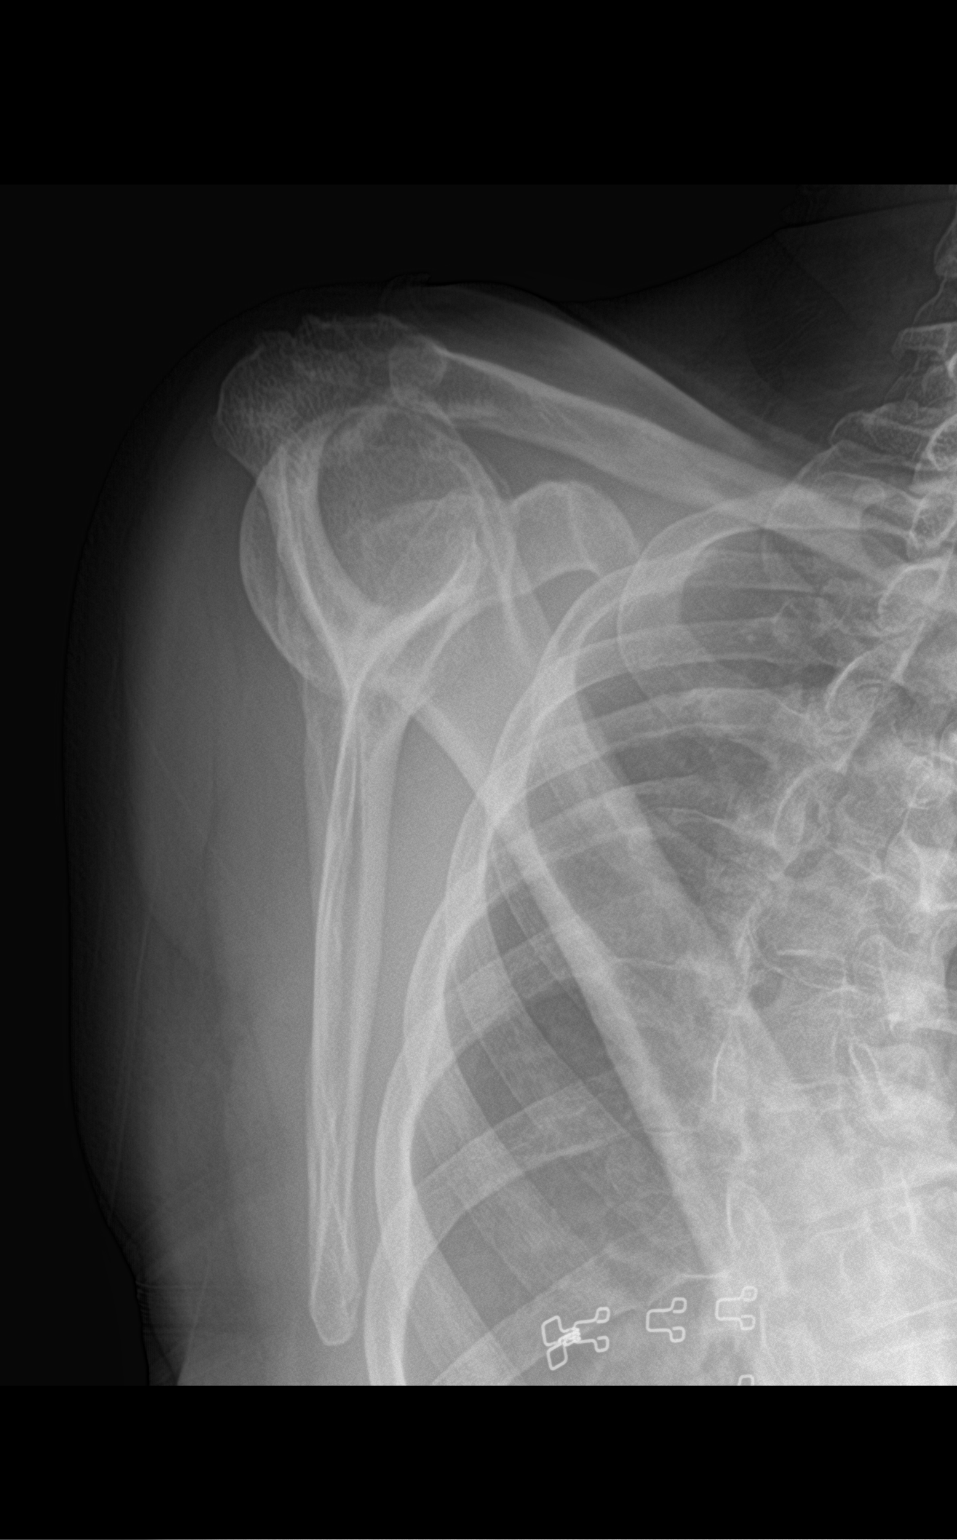
[im 3/3]
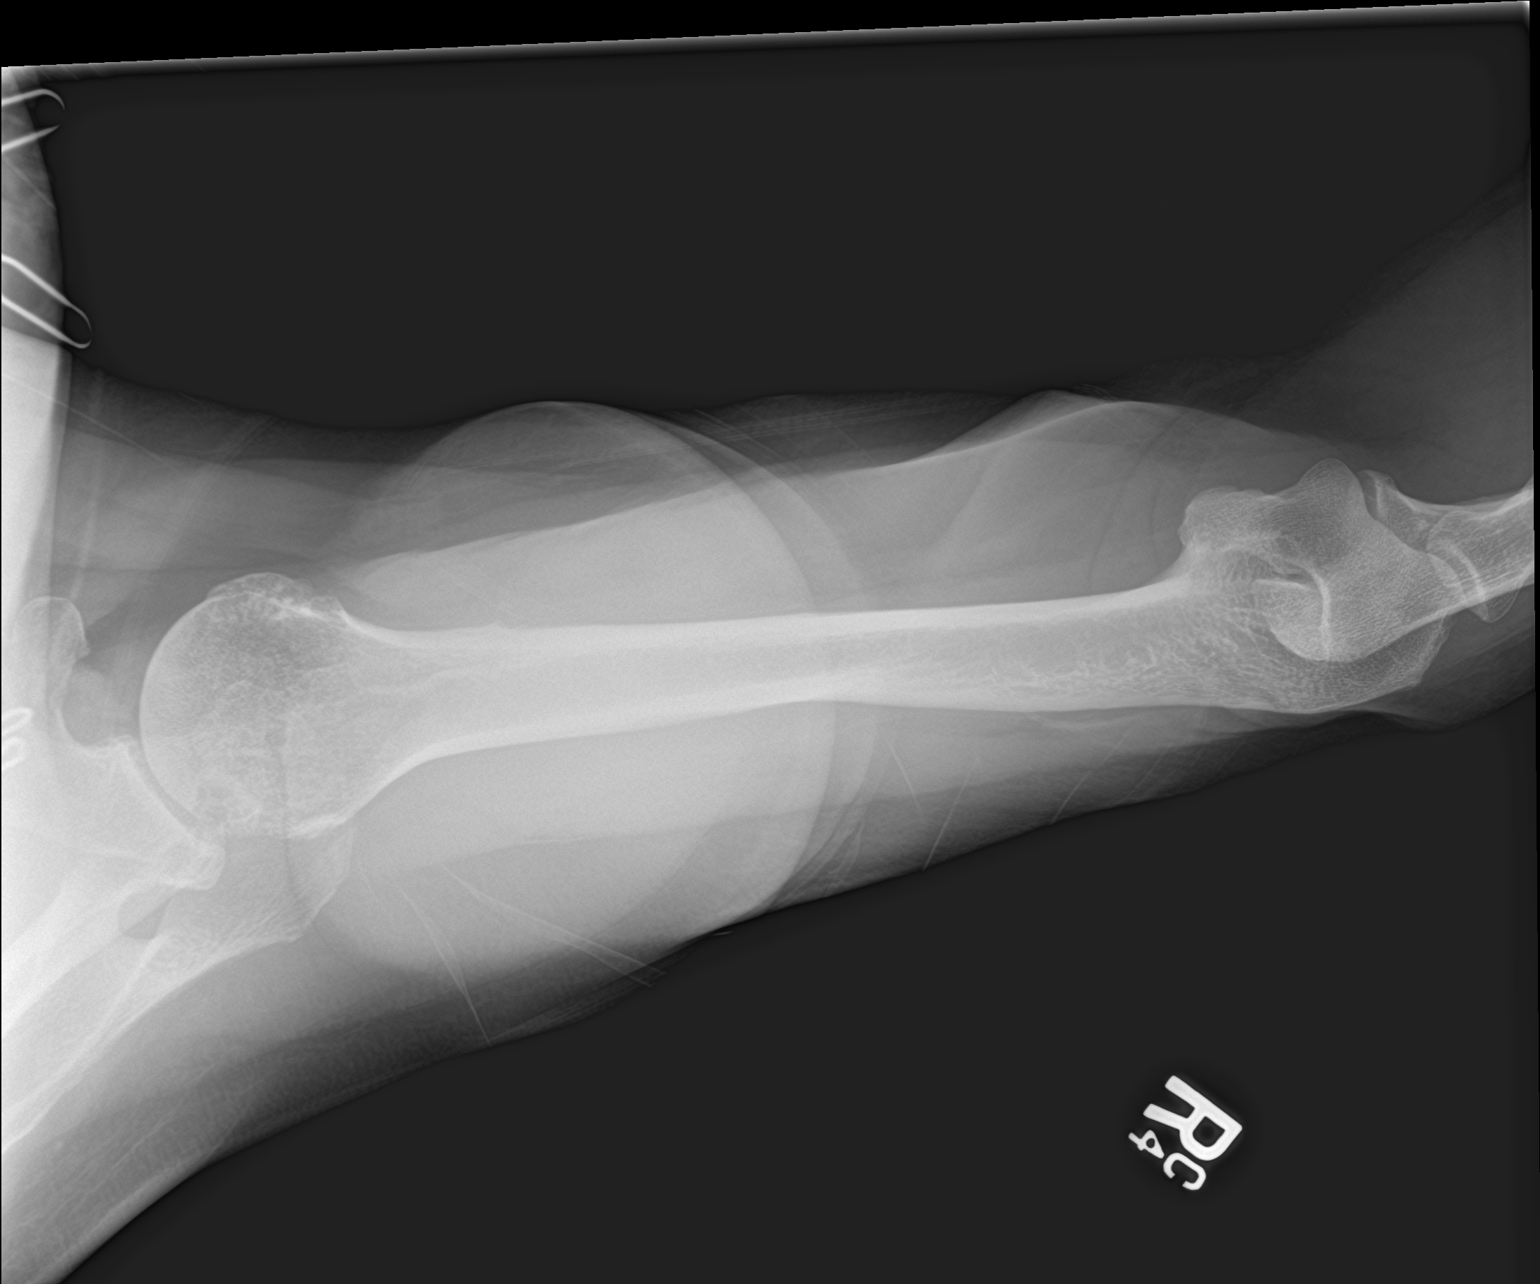

[3 of 3 positions shown; findings below may reference images not displayed]

FINDINGS: Acromioclavicular glenohumeral degenerative change. No evidence of
fracture or dislocation. Aortic stent graft noted.
IMPRESSION: Acromioclavicular glenohumeral degenerative change. No acute
abnormality.

## 2021-04-29 IMAGING — CR RIGHT WRIST - COMPLETE 3+ VIEW
1 series · 4 of 4 positions shown · non-contrast
Comparison: None.

CLINICAL DATA: Right wrist pain after a fall.

EXAM:
RIGHT WRIST - COMPLETE 3+ VIEW

[Series 1: dg wrist complete right · 0.14mm/px · 4 of 4 slices shown]
[im 1/4]
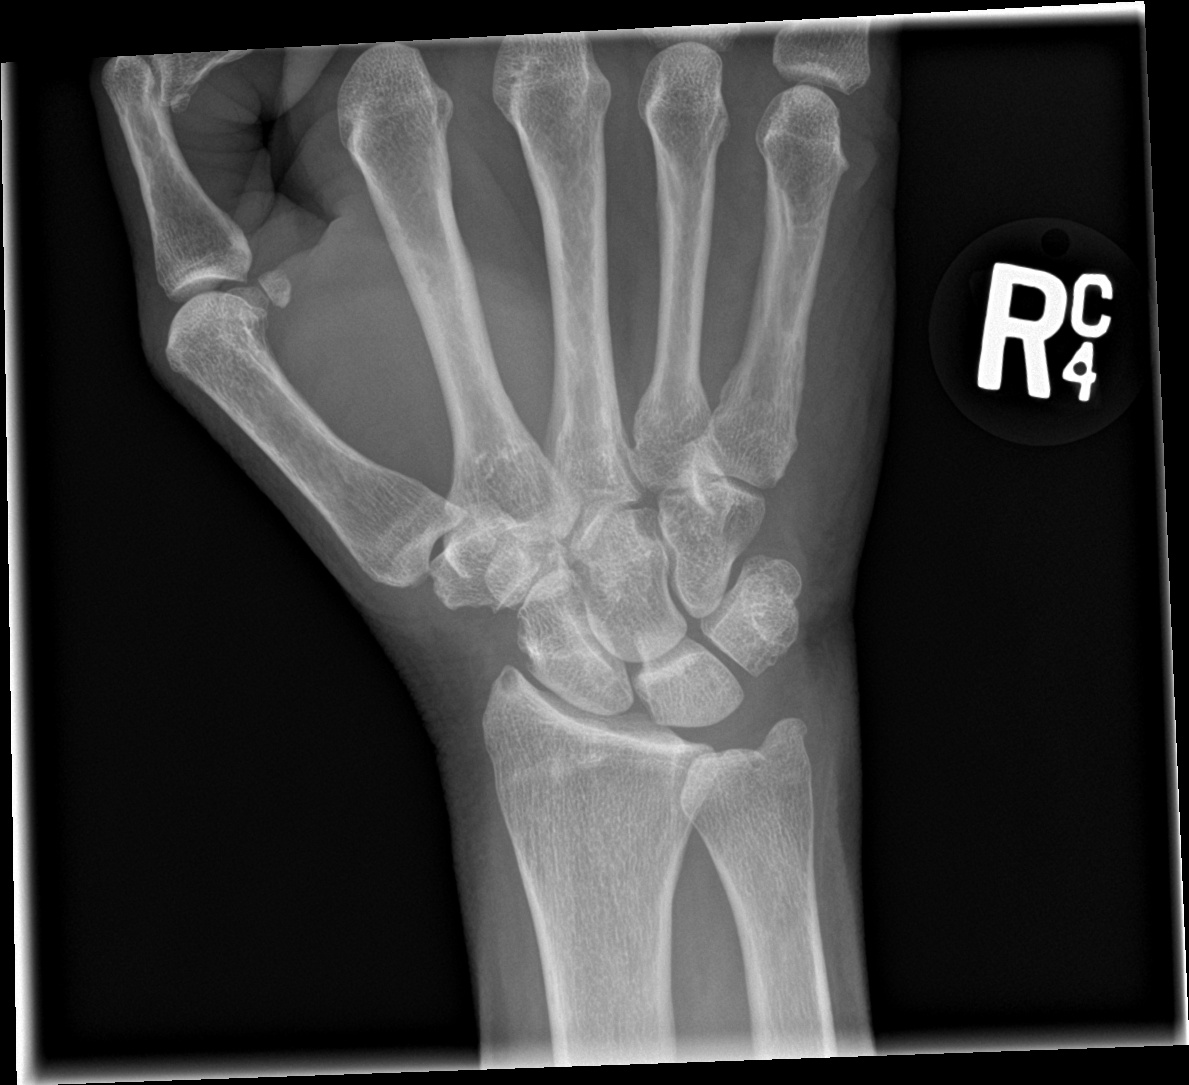
[im 2/4]
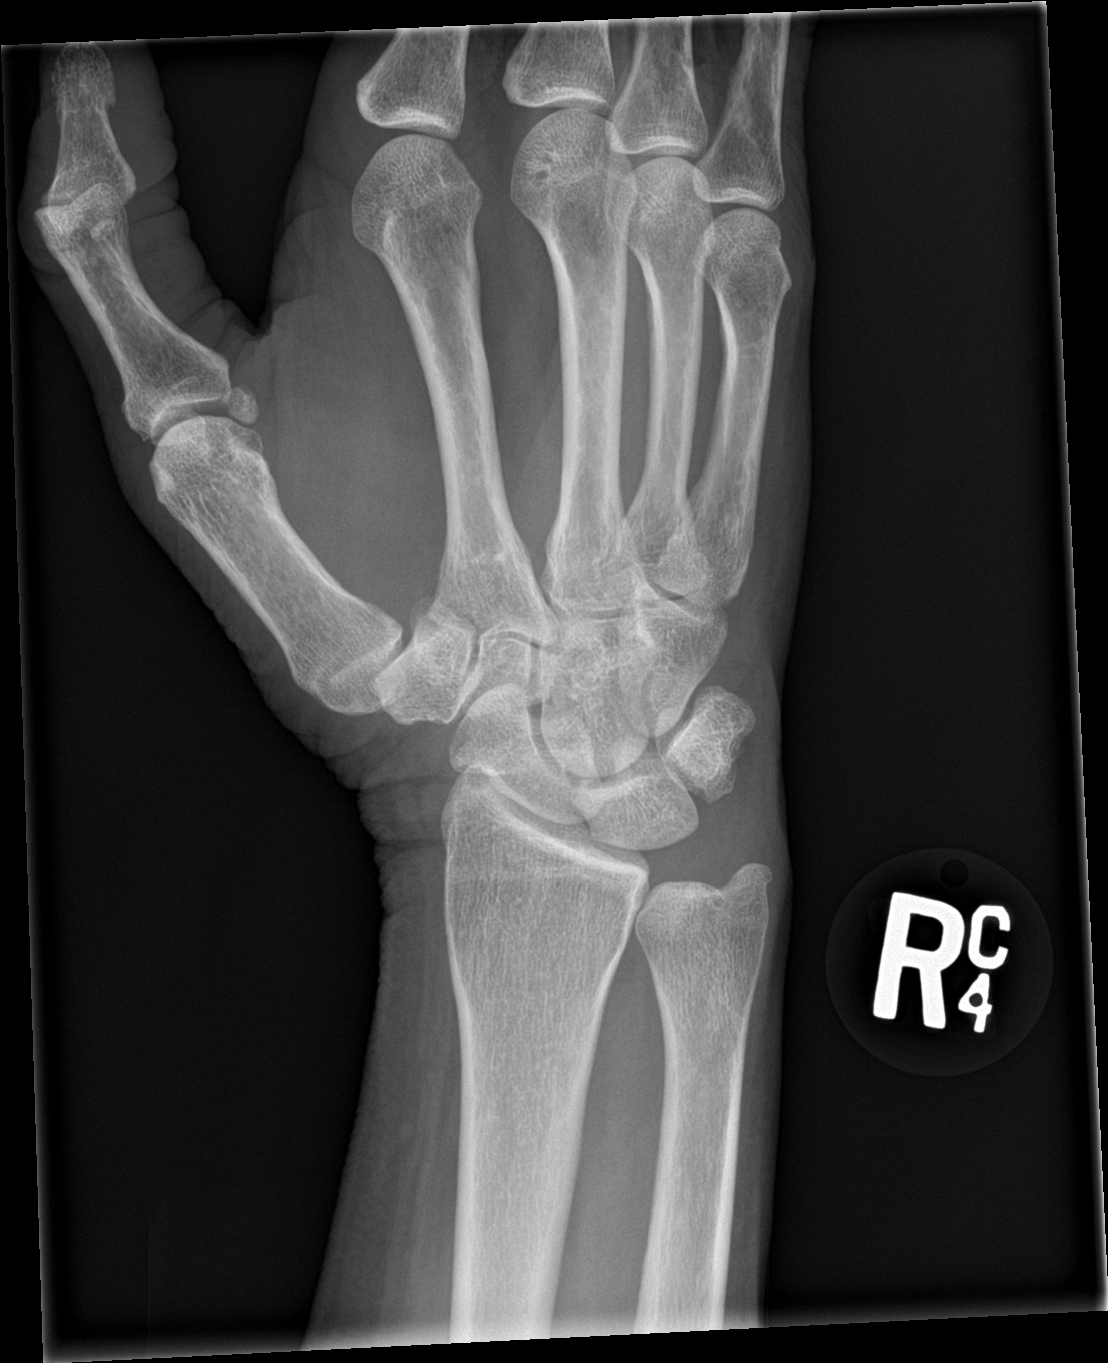
[im 3/4]
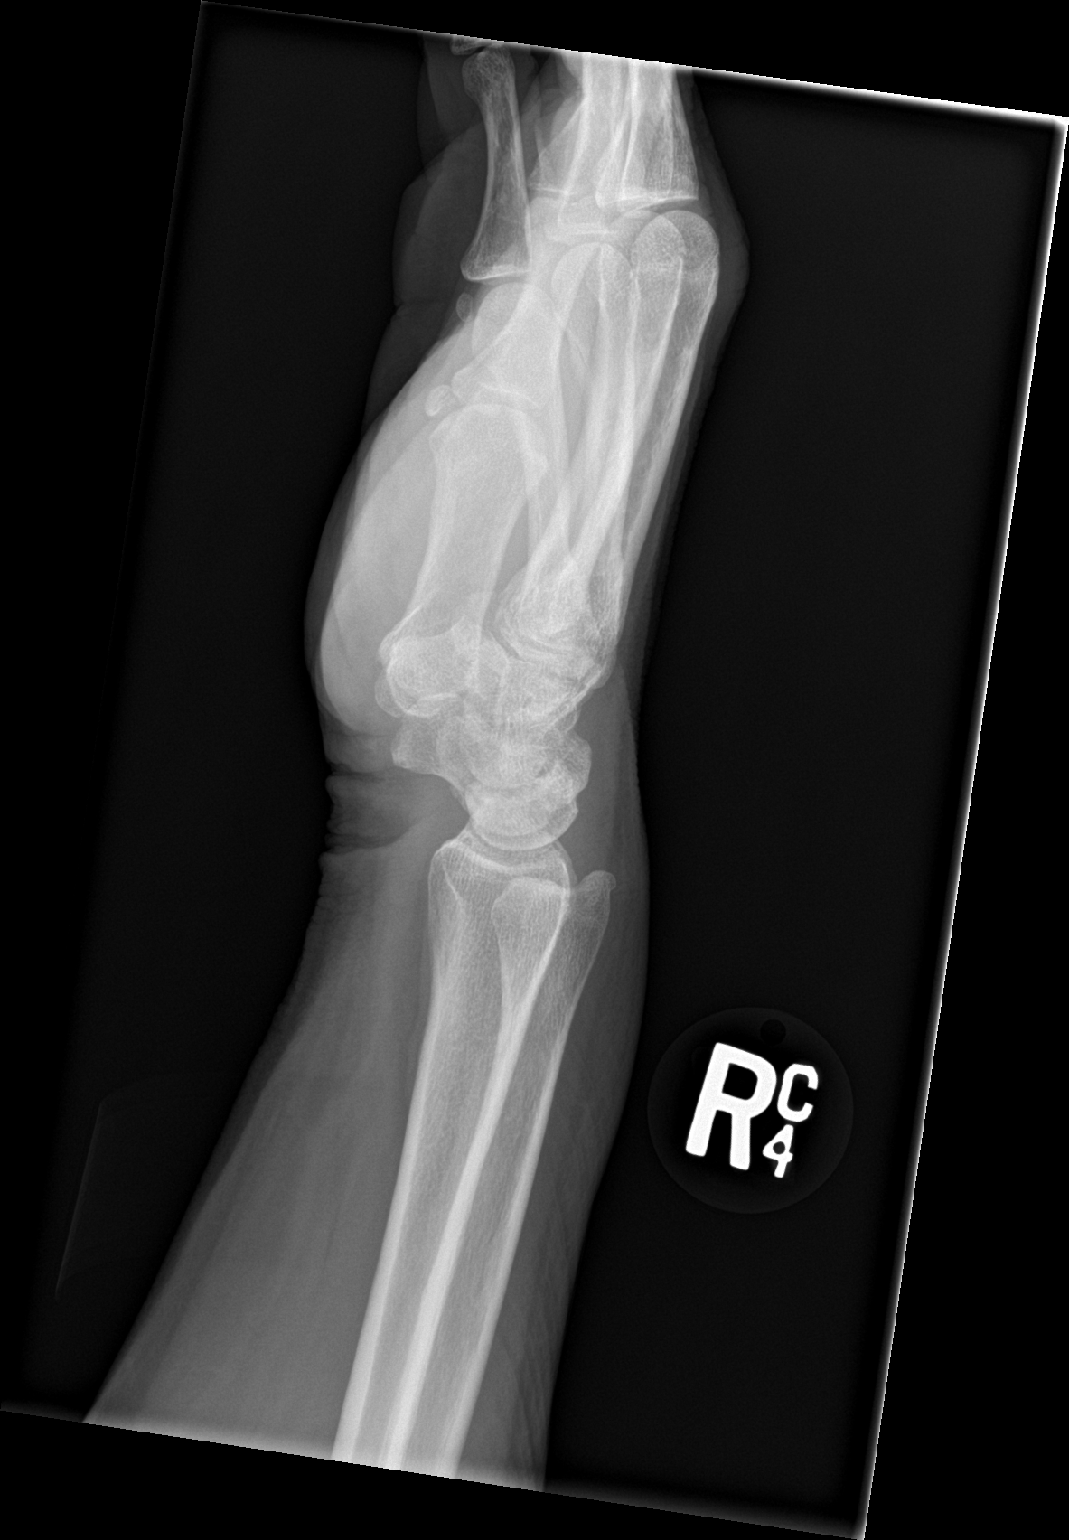
[im 4/4]
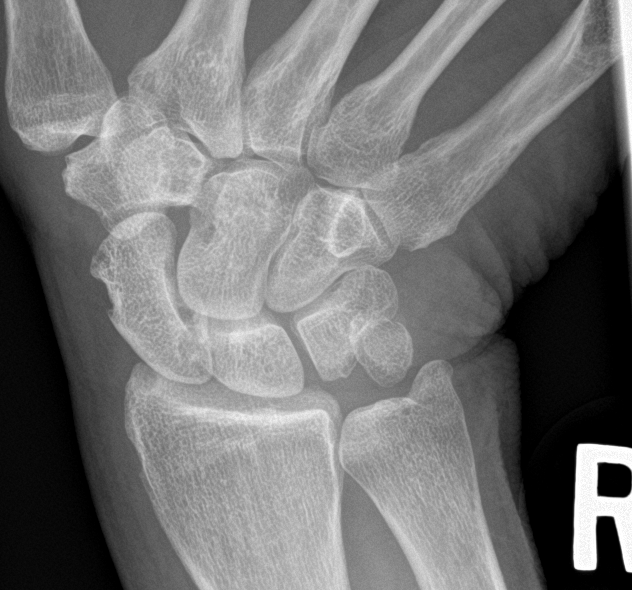

[4 of 4 positions shown; findings below may reference images not displayed]

FINDINGS: No acute fracture or dislocation is identified. Mild radiocarpal
degenerative changes are noted with a 3 mm lucency in the radial
styloid likely reflecting a subchondral cyst and with degenerative
appearing cortical irregularity involving the adjacent scaphoid.
Mild soft tissue swelling is noted about the wrist.
IMPRESSION: No acute osseous abnormality identified.

## 2021-04-29 IMAGING — CR RIGHT KNEE - 1-2 VIEW
1 series · 2 of 2 positions shown · non-contrast
Comparison: No prior.

CLINICAL DATA: Pain after fall.

EXAM:
RIGHT KNEE - 1-2 VIEW

[Series 1: dg knee 1-2 views right · 0.14mm/px · 2 of 2 slices shown]
[im 1/2]
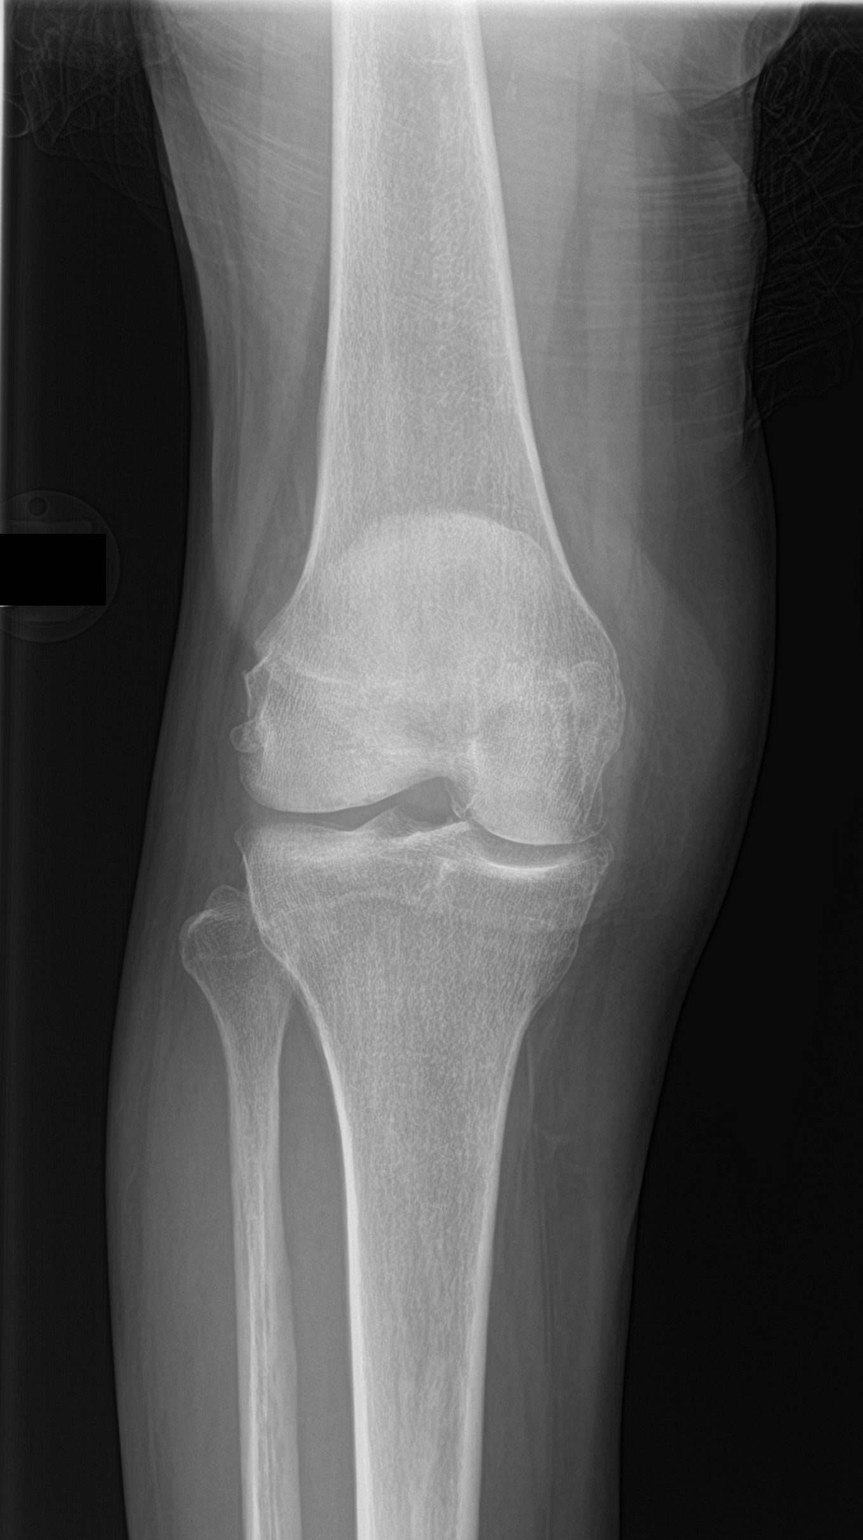
[im 2/2]
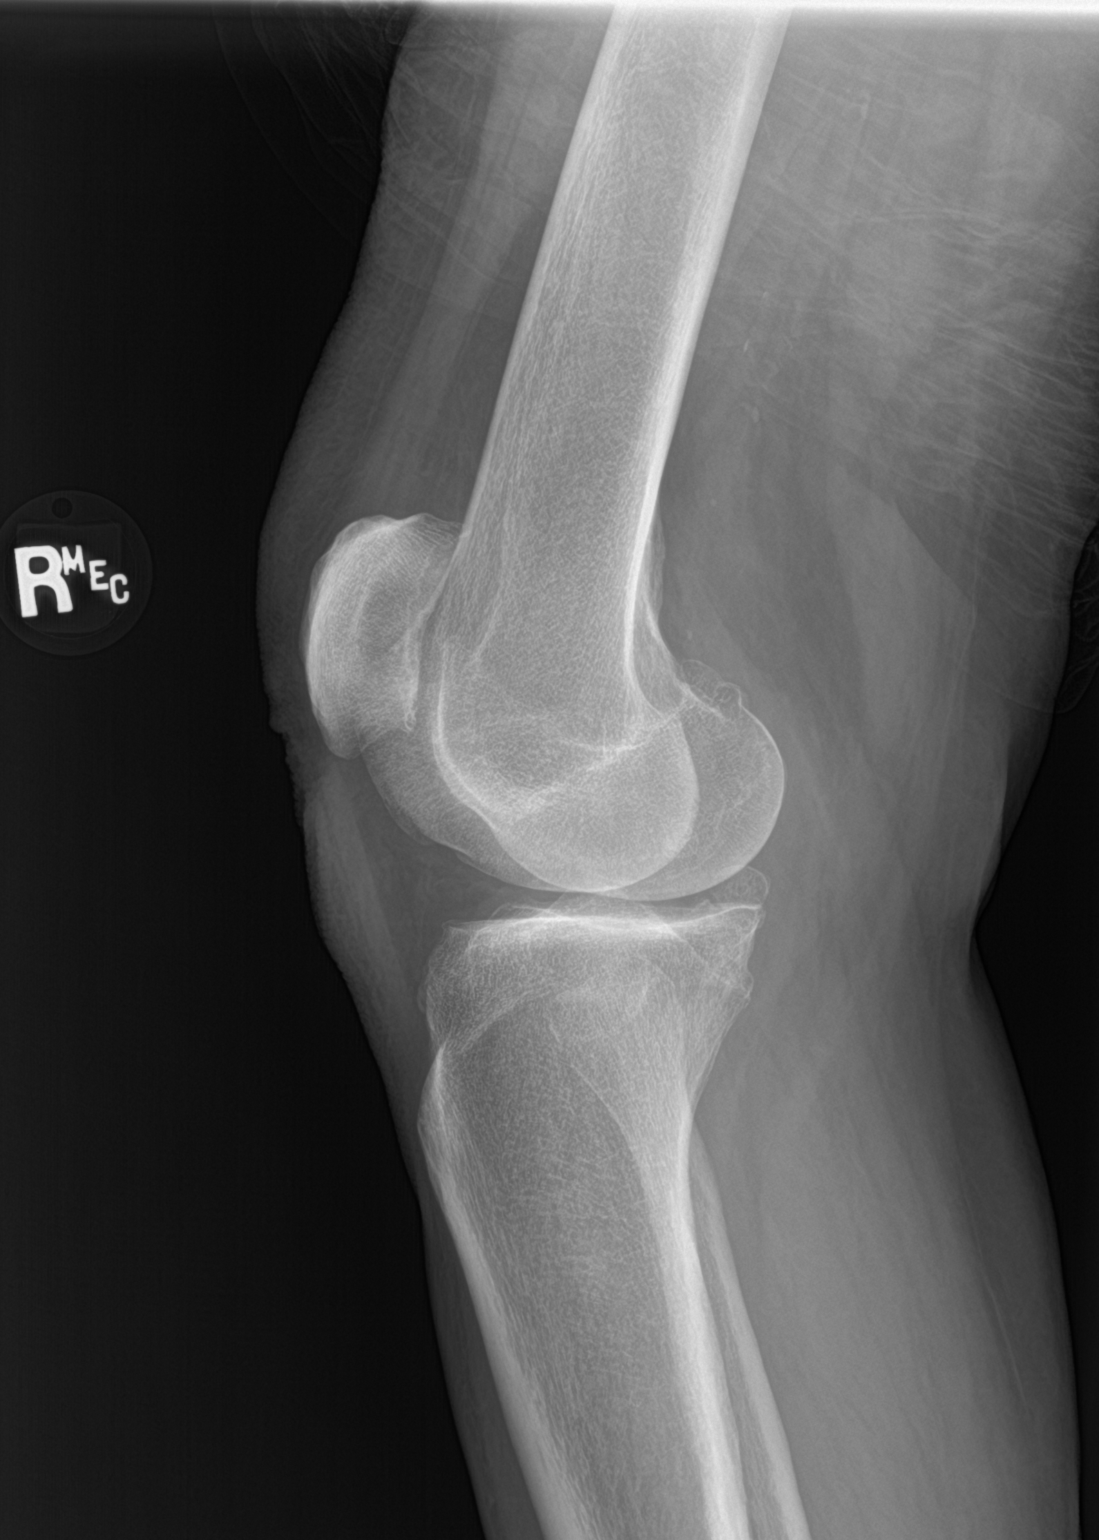

[2 of 2 positions shown; findings below may reference images not displayed]

FINDINGS: Tricompartment degenerative change. No evidence of fracture or
dislocation.
IMPRESSION: Tricompartment degenerative change.  No acute abnormality.

## 2021-11-20 IMAGING — MR MR HEEL *L* W/O CM
8 series · 36 of 40 positions shown · non-contrast
Comparison: None.

CLINICAL DATA: Chronic left foot pain and burning sensation and
numbness in the toes.

EXAM:
MR OF THE LEFT HEEL WITHOUT CONTRAST
TECHNIQUE: Multiplanar, multisequence MR imaging of the left hindfoot was
performed. No intravenous contrast was administered.

[Series 3: STIR · sagittal · left · 4.0mm · 0.35mm/px · 3 of 21 slices shown (1 of 3)]
[im 1/21]
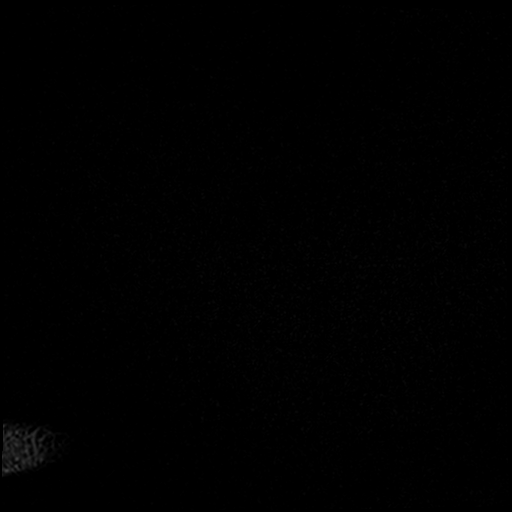
[im 11/21]
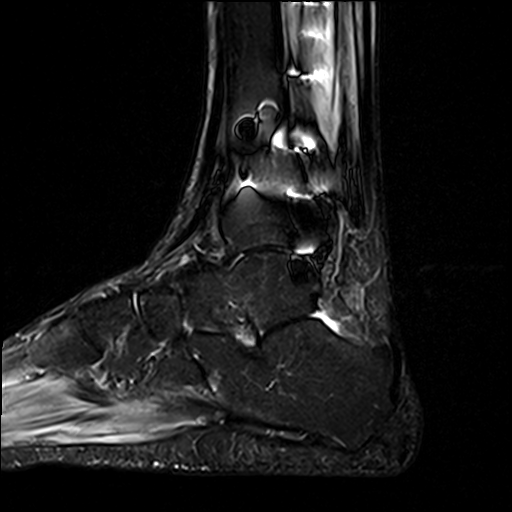
[im 21/21]
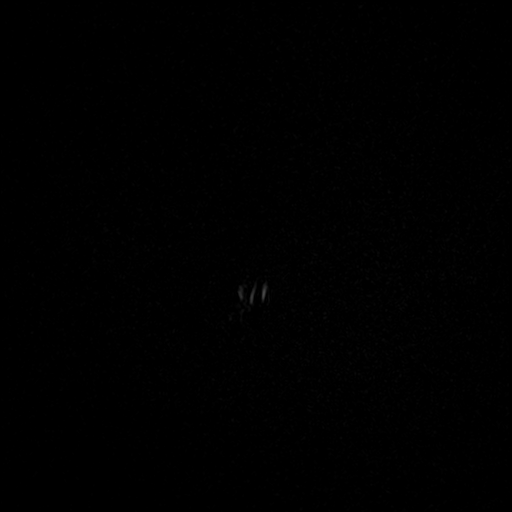

[Series 4: STIR · axial · left · 4.0mm · 0.35mm/px · z∈[-66,+87]mm · 6 of 35 slices shown (2 of 3)]
[im 1/35]
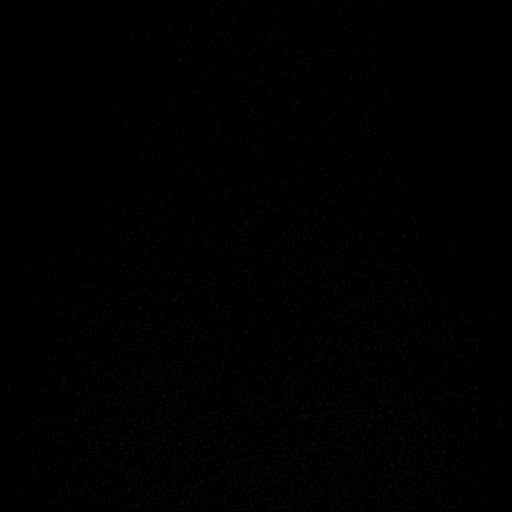
[im 7/35]
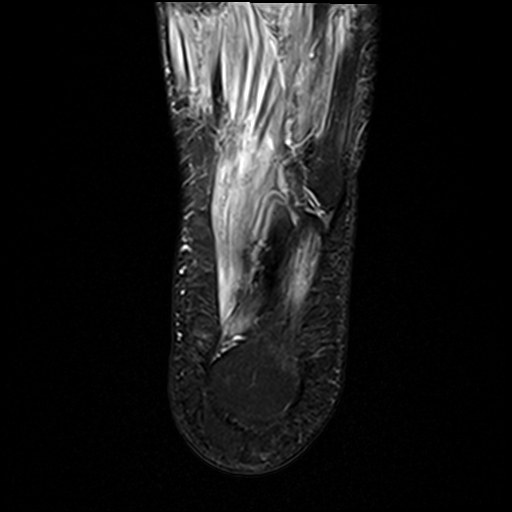
[im 14/35]
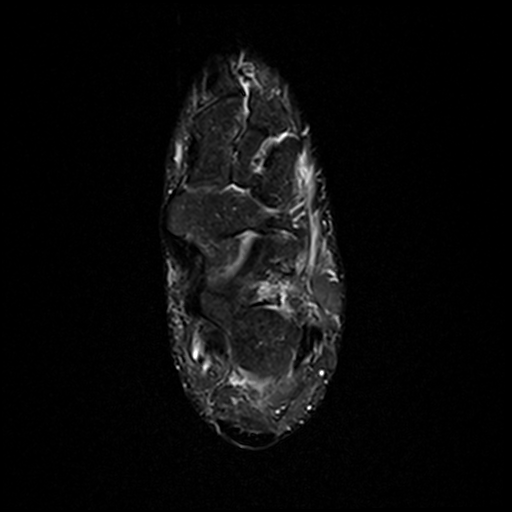
[im 21/35]
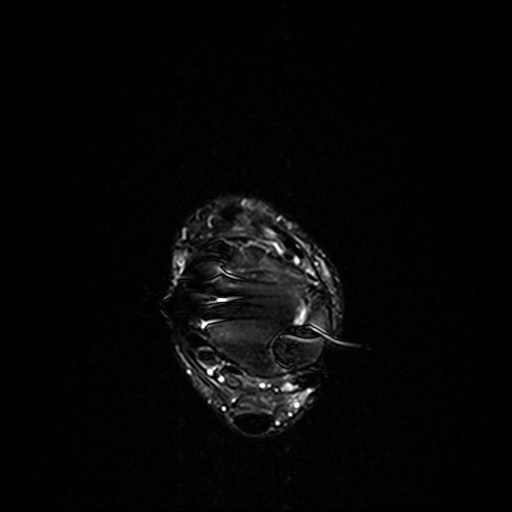
[im 28/35]
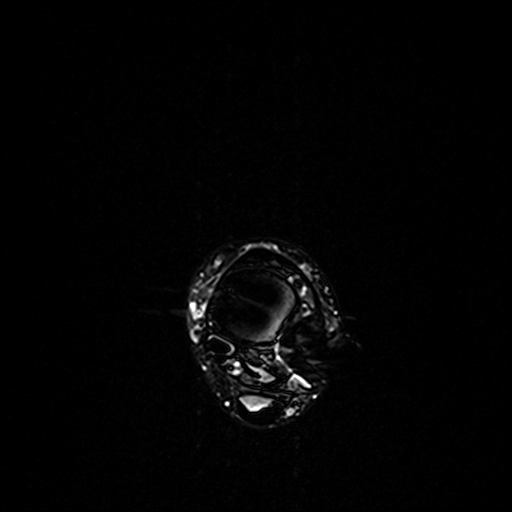
[im 35/35]
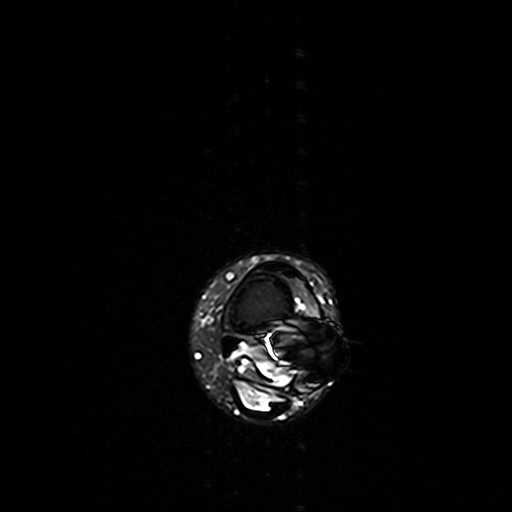

[Series 5: PD fat-sat · axial · left · 4.0mm · 0.50mm/px · z∈[-66,+86]mm · 6 of 35 slices shown]
[im 1/35]
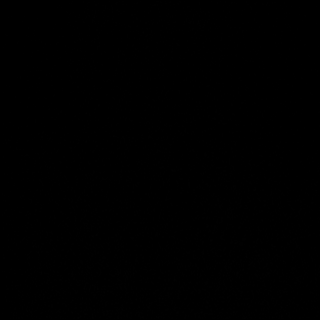
[im 7/35]
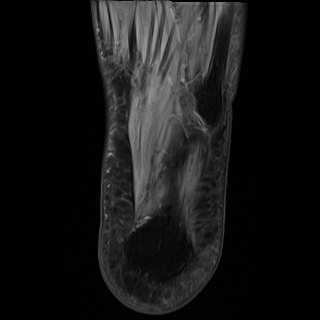
[im 14/35]
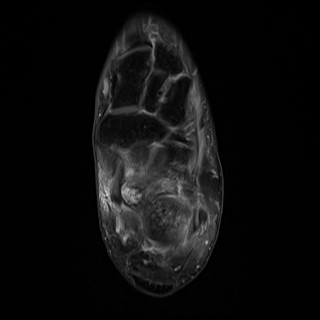
[im 21/35]
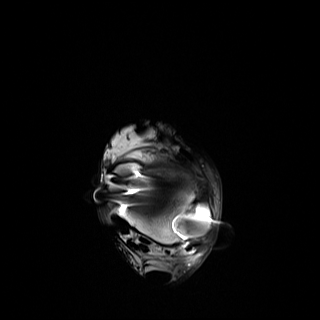
[im 28/35]
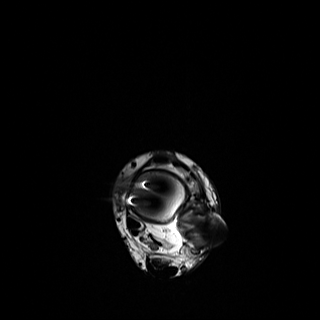
[im 35/35]
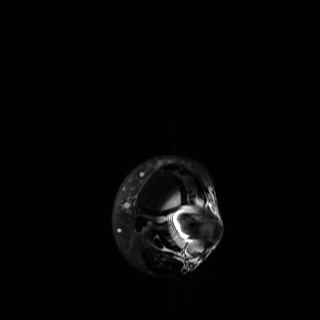

[Series 6: T2 fat-sat · axial · left · 4.0mm · 0.50mm/px · z∈[-68,+89]mm · 6 of 36 slices shown (1 of 2)]
[im 1/36]
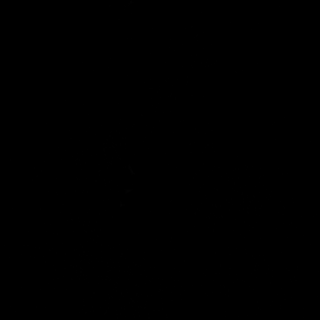
[im 8/36]
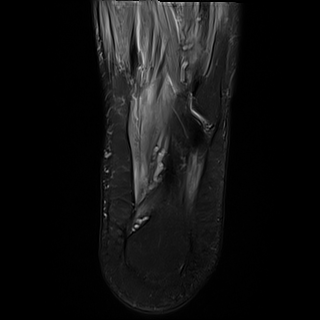
[im 15/36]
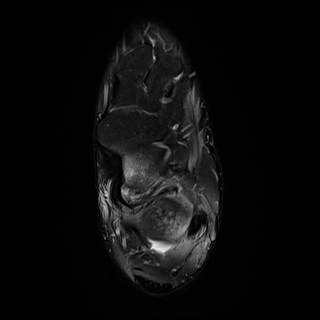
[im 22/36]
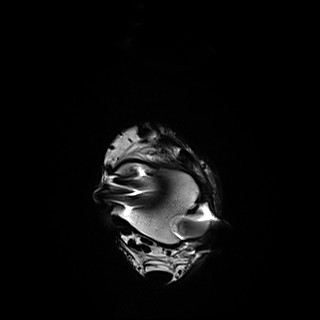
[im 29/36]
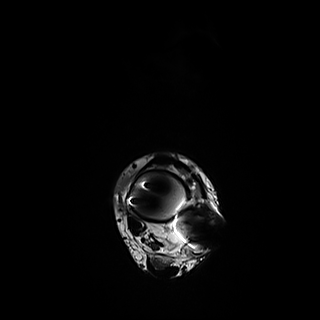
[im 36/36]
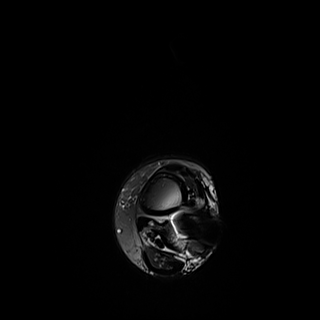

[Series 7: STIR · coronal · left · 4.0mm · 0.31mm/px · 1 of 32 slices shown (3 of 3)]
[im 1/32]
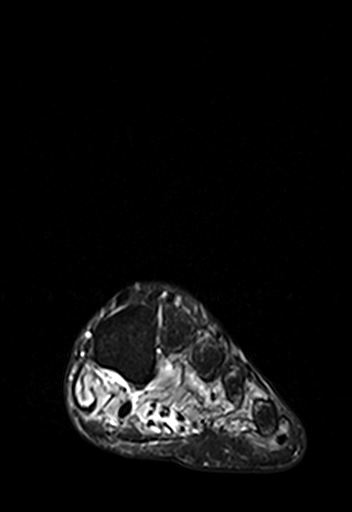

[Series 8: T2 fat-sat · coronal · left · 4.0mm · 0.62mm/px · 5 of 32 slices shown (2 of 2)]
[im 1/32]
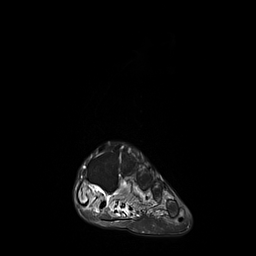
[im 8/32]
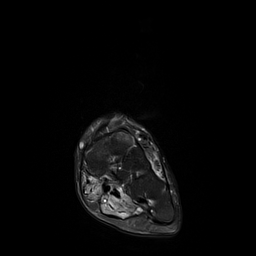
[im 16/32]
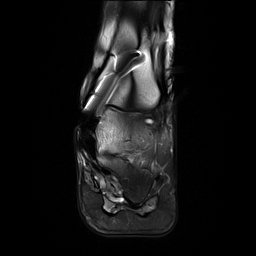
[im 24/32]
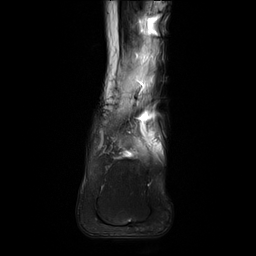
[im 32/32]
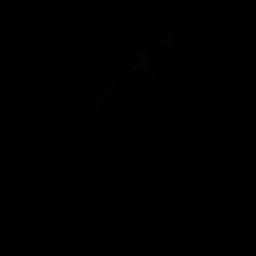

[Series 9: T1 · sagittal · left · 4.0mm · 0.70mm/px · 3 of 21 slices shown (1 of 2)]
[im 1/21]
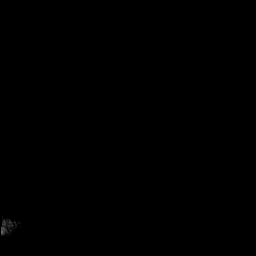
[im 11/21]
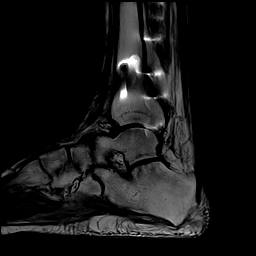
[im 21/21]
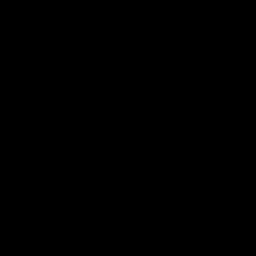

[Series 10: T1 · axial · left · 4.0mm · 0.70mm/px · z∈[-66,+87]mm · 6 of 35 slices shown (2 of 2)]
[im 1/35]
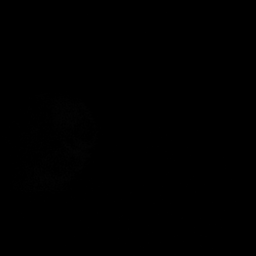
[im 7/35]
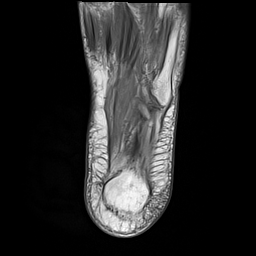
[im 14/35]
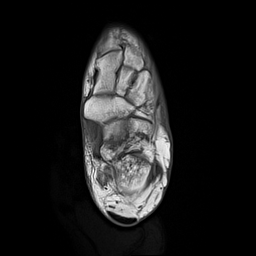
[im 21/35]
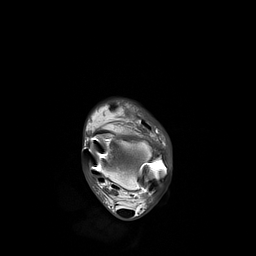
[im 28/35]
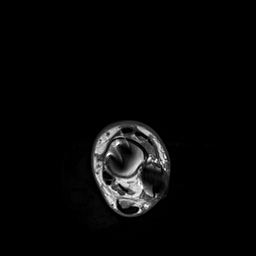
[im 35/35]
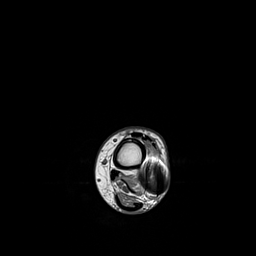

[36 of 40 positions shown; findings below may reference images not displayed]

FINDINGS: TENDONS

Peroneal: Normal.

Posteromedial: Normal.

Anterior: Normal.

Achilles: Normal.

Plantar Fascia: Normal.

LIGAMENTS

Lateral: The posterior talofibular ligament is intact. The anterior
talofibular ligament is not discretely identifiable.

Medial: The deltoid ligament region is mostly obscured by metallic
artifact.

CARTILAGE

Ankle Joint: No joint effusion or chondral defect.

Subtalar Joints/Sinus Tarsi: No joint effusion or chondral defect.

Bones: No significant abnormalities. Hardware in the medial
malleolus and distal fibula obscure detail in those regions.

Other: There is edema in the muscles of the plantar aspect of the
foot as well as in the visualized portion of distal soleus muscle.
The appearance is nonspecific but could represent denervation.
IMPRESSION: 1. Edema in the muscles of the plantar aspect of the foot as well as
in the distal soleus muscle. The appearance is nonspecific but could
represent denervation.
2. No appreciable abnormalities in the region of the tarsal tunnel.
3. No other significant abnormalities.

## 2022-08-22 ENCOUNTER — Ambulatory Visit: Payer: Medicare HMO | Admitting: Podiatry

## 2022-09-05 ENCOUNTER — Ambulatory Visit: Payer: Medicare HMO | Admitting: Podiatry

## 2022-09-18 ENCOUNTER — Ambulatory Visit: Payer: Medicare HMO | Admitting: Podiatry

## 2022-09-18 ENCOUNTER — Encounter: Payer: Self-pay | Admitting: Podiatry

## 2022-09-18 ENCOUNTER — Ambulatory Visit: Payer: Medicare HMO

## 2022-09-18 DIAGNOSIS — M79675 Pain in left toe(s): Secondary | ICD-10-CM

## 2022-09-18 DIAGNOSIS — M2042 Other hammer toe(s) (acquired), left foot: Secondary | ICD-10-CM | POA: Diagnosis not present

## 2022-09-18 DIAGNOSIS — M79674 Pain in right toe(s): Secondary | ICD-10-CM | POA: Diagnosis not present

## 2022-09-18 DIAGNOSIS — M2041 Other hammer toe(s) (acquired), right foot: Secondary | ICD-10-CM

## 2022-09-18 DIAGNOSIS — B351 Tinea unguium: Secondary | ICD-10-CM

## 2022-09-18 DIAGNOSIS — E1140 Type 2 diabetes mellitus with diabetic neuropathy, unspecified: Secondary | ICD-10-CM

## 2022-09-18 NOTE — Progress Notes (Signed)
  Subjective:  Patient ID: Julie Tran, female    DOB: 1951/03/11,  MRN: [id number]  Chief Complaint  Patient presents with   Diabetes    Nail trim    72 y.o. female  presents with the above complaint.  Patient presents with thickened elongated dystrophic mycotic toenails x 10 mild pain on palpation she would like for it to be debrided down.  She would also like to get diabetic shoes is been 3 years since she likely had a pair.  Objective:  There were no vitals filed for this visit. Podiatric Exam: Vascular: dorsalis pedis and posterior tibial pulses are palpable bilateral. Capillary return is immediate. Temperature gradient is WNL. Skin turgor WNL  Sensorium: Diminished protective sensation noted to distal digits bilaterally.  Positive Tinel's sign noted at the tarsal tunnel upon percussion bilaterally extending across the entire plantar surface of the foot. Nail Exam: Pt has thick disfigured discolored nails with subungual debris noted bilateral entire nail hallux through fifth toenails Ulcer Exam: There is no evidence of ulcer or pre-ulcerative changes or infection. Orthopedic Exam: Muscle tone and strength are WNL. No limitations in general ROM. No crepitus or effusions noted. HAV  B/L.  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 Skin: No Porokeratosis. No infection or ulcers  Assessment & Plan:  Patient was evaluated and treated and all questions answered.  Hammertoe bilateral -Given the presence of hammertoe contracture in setting of diabetes patient will benefit from diabetic shoes.  Patient was scheduled to see the orthotics department for diabetic shoes  Onychomycosis with pain x10  Procedure: Nail Debridement Rationale: pain  Type of Debridement: manual, sharp debridement. Instrumentation: Nail nipper, rotary burr. Number of Nails: 10  Procedures and Treatment: Consent by patient was obtained for treatment procedures. The patient understood the discussion of treatment and procedures well.  All questions were answered thoroughly reviewed. Debridement of mycotic and hypertrophic toenails, 1 through 5 bilateral and clearing of subungual debris. No ulceration, no infection noted.  Return Visit-Office Procedure: Patient instructed to return to the office for a follow up visit 3 months for continued evaluation and treatment.  Nicholes Rough, DPM    Return in about 3 months (around 12/19/2022).    Nicholes Rough, DPM

## 2022-09-18 NOTE — Progress Notes (Unsigned)
Patient presents to the office today for diabetic shoe and insole measuring.  ABN signed.   Documentation of medical necessity will be sent to patient's treating diabetic doctor to verify and sign.   Patient's diabetic provider: Altamese [place], MD  NPI: [id number]   Shoes and insoles will be ordered at that time and patient will be notified for an appointment for fitting when they arrive.   Patient shoe selection-   1st   Shoe choice:   [id number]  Shoe size ordered: 9.5

## 2022-11-21 ENCOUNTER — Telehealth (INDEPENDENT_AMBULATORY_CARE_PROVIDER_SITE_OTHER): Payer: Medicare HMO | Admitting: Podiatry

## 2022-11-21 NOTE — Telephone Encounter (Signed)
No answer , vm is fuill  tried to reach patient to schedule picking up diabetic shoes

## 2022-11-26 ENCOUNTER — Ambulatory Visit (INDEPENDENT_AMBULATORY_CARE_PROVIDER_SITE_OTHER): Payer: Medicare HMO | Admitting: Podiatry

## 2022-11-26 DIAGNOSIS — E1140 Type 2 diabetes mellitus with diabetic neuropathy, unspecified: Secondary | ICD-10-CM

## 2022-11-26 DIAGNOSIS — M2042 Other hammer toe(s) (acquired), left foot: Secondary | ICD-10-CM

## 2022-11-26 DIAGNOSIS — M2041 Other hammer toe(s) (acquired), right foot: Secondary | ICD-10-CM

## 2022-11-26 NOTE — Progress Notes (Signed)
Patient presents today to pick up diabetic shoes and insoles.  Patient was dispensed 1 pair of diabetic shoes and 3 pairs of foam casted diabetic insoles. Fit was not satisfactory got patient into a size 10 shoe that fit really good.  Instructions for break-in and wear was reviewed and a copy was given to the patient.   Re-appointment for regularly scheduled diabetic foot care visits or if they should experience any trouble with the shoes or insoles.

## 2022-12-20 ENCOUNTER — Ambulatory Visit: Payer: Medicare HMO | Admitting: Podiatry

## 2023-06-10 ENCOUNTER — Telehealth: Payer: Self-pay | Admitting: Podiatry

## 2023-06-10 NOTE — Telephone Encounter (Signed)
 Pt came by the office to check on status of dm shoes that were ordered according to pt. I let pt know to schedule another appt to renew her previous order, but pt refused. Pt wants to get the shoes that were ordered as her endocrinologist is leaving soon. I let pt know that we should contact her soon but her best would be to schedule an appt to get her feet remeasured.

## 2023-06-11 DIAGNOSIS — Z7984 Long term (current) use of oral hypoglycemic drugs: Secondary | ICD-10-CM | POA: Diagnosis not present

## 2023-06-11 DIAGNOSIS — Z7189 Other specified counseling: Secondary | ICD-10-CM | POA: Diagnosis not present

## 2023-06-11 DIAGNOSIS — E1142 Type 2 diabetes mellitus with diabetic polyneuropathy: Secondary | ICD-10-CM | POA: Diagnosis not present

## 2023-06-11 DIAGNOSIS — E1165 Type 2 diabetes mellitus with hyperglycemia: Secondary | ICD-10-CM | POA: Diagnosis not present

## 2023-06-11 DIAGNOSIS — Z794 Long term (current) use of insulin: Secondary | ICD-10-CM | POA: Diagnosis not present

## 2023-06-11 DIAGNOSIS — Z719 Counseling, unspecified: Secondary | ICD-10-CM | POA: Diagnosis not present

## 2023-06-18 ENCOUNTER — Telehealth: Payer: Self-pay

## 2023-06-18 NOTE — Telephone Encounter (Signed)
Patient was here last week stating we sent her shoes back and never gave her another pair I see that shoe was attempted to be fit and was too small then [facility] had a 10 in stock and patient took it as 9.5 MD was too small  I sent shoe back in Sept as it was just sitting here in Lab. I have re-ordered same Shoe that was sent back in / [id number] Will call patient to come in when shoe arrives

## 2023-07-04 DIAGNOSIS — E1165 Type 2 diabetes mellitus with hyperglycemia: Secondary | ICD-10-CM | POA: Diagnosis not present

## 2023-07-04 DIAGNOSIS — Z794 Long term (current) use of insulin: Secondary | ICD-10-CM | POA: Diagnosis not present

## 2023-07-04 DIAGNOSIS — E1142 Type 2 diabetes mellitus with diabetic polyneuropathy: Secondary | ICD-10-CM | POA: Diagnosis not present

## 2023-07-04 DIAGNOSIS — Z7984 Long term (current) use of oral hypoglycemic drugs: Secondary | ICD-10-CM | POA: Diagnosis not present

## 2023-07-09 ENCOUNTER — Telehealth: Payer: Self-pay

## 2023-07-09 NOTE — Telephone Encounter (Signed)
 Called PT Vm is full could not leave VM. Calling to reschedule 4/18 appt bc we are closed that day.

## 2023-08-22 ENCOUNTER — Other Ambulatory Visit: Payer: Medicare HMO

## 2023-11-28 ENCOUNTER — Telehealth: Payer: Self-pay

## 2023-11-28 NOTE — Telephone Encounter (Signed)
 Shoes in [place] .SABRA No signed documents received
